# Patient Record
Sex: Female | Born: 1940
Health system: Southern US, Community
[De-identification: ages and names within clinical notes are randomized; demographics above are authoritative.]

## PROBLEM LIST (undated history)

## (undated) DIAGNOSIS — R569 Unspecified convulsions: Secondary | ICD-10-CM

## (undated) DIAGNOSIS — Z8679 Personal history of other diseases of the circulatory system: Secondary | ICD-10-CM

## (undated) DIAGNOSIS — F0390 Unspecified dementia without behavioral disturbance: Secondary | ICD-10-CM

## (undated) DIAGNOSIS — R4182 Altered mental status, unspecified: Secondary | ICD-10-CM

## (undated) DIAGNOSIS — R600 Localized edema: Secondary | ICD-10-CM

## (undated) DIAGNOSIS — M4800 Spinal stenosis, site unspecified: Secondary | ICD-10-CM

## (undated) DIAGNOSIS — Z9889 Other specified postprocedural states: Secondary | ICD-10-CM

## (undated) DIAGNOSIS — M810 Age-related osteoporosis without current pathological fracture: Secondary | ICD-10-CM

## (undated) HISTORY — DX: Localized edema: R60.0

## (undated) HISTORY — DX: Unspecified convulsions: R56.9

## (undated) HISTORY — PX: OTHER SURGICAL HISTORY: SHX169

## (undated) HISTORY — DX: Age-related osteoporosis without current pathological fracture: M81.0

## (undated) HISTORY — PX: TONSILLECTOMY: SUR1361

## (undated) HISTORY — DX: Spinal stenosis, site unspecified: M48.00

---

## 2003-08-03 ENCOUNTER — Emergency Department (HOSPITAL_COMMUNITY): Admission: EM | Admit: 2003-08-03 | Discharge: 2003-08-03 | Payer: Self-pay | Admitting: Emergency Medicine

## 2008-08-22 ENCOUNTER — Encounter: Admission: RE | Admit: 2008-08-22 | Discharge: 2008-08-22 | Payer: Self-pay | Admitting: Family Medicine

## 2009-06-09 ENCOUNTER — Encounter: Admission: RE | Admit: 2009-06-09 | Discharge: 2009-06-09 | Payer: Self-pay | Admitting: Family Medicine

## 2009-06-23 ENCOUNTER — Encounter: Admission: RE | Admit: 2009-06-23 | Discharge: 2009-06-23 | Payer: Self-pay | Admitting: Family Medicine

## 2010-06-15 ENCOUNTER — Encounter: Admission: RE | Admit: 2010-06-15 | Discharge: 2010-06-15 | Payer: Self-pay | Admitting: Family Medicine

## 2010-07-18 ENCOUNTER — Ambulatory Visit (HOSPITAL_COMMUNITY): Admission: RE | Admit: 2010-07-18 | Discharge: 2010-07-18 | Payer: Self-pay | Admitting: Neurosurgery

## 2010-08-17 ENCOUNTER — Ambulatory Visit (HOSPITAL_COMMUNITY): Admission: RE | Admit: 2010-08-17 | Discharge: 2010-08-17 | Payer: Self-pay | Admitting: Neurosurgery

## 2010-09-01 HISTORY — PX: CEREBRAL ANEURYSM REPAIR: SHX164

## 2010-09-02 ENCOUNTER — Inpatient Hospital Stay (HOSPITAL_COMMUNITY)
Admission: RE | Admit: 2010-09-02 | Discharge: 2010-09-05 | Payer: Self-pay | Source: Home / Self Care | Admitting: Neurosurgery

## 2010-12-12 LAB — BASIC METABOLIC PANEL
BUN: 10 mg/dL (ref 6–23)
BUN: 11 mg/dL (ref 6–23)
CO2: 28 mEq/L (ref 19–32)
CO2: 30 mEq/L (ref 19–32)
Calcium: 8.9 mg/dL (ref 8.4–10.5)
Calcium: 9.3 mg/dL (ref 8.4–10.5)
Chloride: 102 mEq/L (ref 96–112)
Chloride: 104 mEq/L (ref 96–112)
Creatinine, Ser: 0.62 mg/dL (ref 0.4–1.2)
Creatinine, Ser: 0.75 mg/dL (ref 0.4–1.2)
GFR calc Af Amer: >60 mL/min (ref >60)
GFR calc Af Amer: >60 mL/min (ref >60)
GFR calc non Af Amer: >60 mL/min (ref >60)
GFR calc non Af Amer: >60 mL/min (ref >60)
Glucose, Bld: 103 mg/dL — ABNORMAL HIGH (ref 70–99)
Glucose, Bld: 151 mg/dL — ABNORMAL HIGH (ref 70–99)
Potassium: 3.9 mEq/L (ref 3.5–5.1)
Potassium: 4.2 mEq/L (ref 3.5–5.1)
Sodium: 138 mEq/L (ref 135–145)
Sodium: 138 mEq/L (ref 135–145)

## 2010-12-12 LAB — URINALYSIS, ROUTINE W REFLEX MICROSCOPIC
Bilirubin Urine: NEGATIVE
Glucose, UA: NEGATIVE mg/dL
Hgb urine dipstick: NEGATIVE
Ketones, ur: 15 mg/dL — AB
Nitrite: NEGATIVE
Protein, ur: NEGATIVE mg/dL
Specific Gravity, Urine: 1.009 (ref 1.005–1.030)
Urobilinogen, UA: 0.2 mg/dL (ref 0.0–1.0)
pH: 7 (ref 5.0–8.0)

## 2010-12-13 LAB — URINE MICROSCOPIC-ADD ON

## 2010-12-13 LAB — CBC
HCT: 41.5 % (ref 36.0–46.0)
Hemoglobin: 13.4 g/dL (ref 12.0–15.0)
MCH: 32.3 pg (ref 26.0–34.0)
MCHC: 32.3 g/dL (ref 30.0–36.0)
MCV: 100 fL (ref 78.0–100.0)
Platelets: 250 10*3/uL (ref 150–400)
RBC: 4.15 MIL/uL (ref 3.87–5.11)
RDW: 13.4 % (ref 11.5–15.5)
WBC: 5.2 10*3/uL (ref 4.0–10.5)

## 2010-12-13 LAB — TYPE AND SCREEN
ABO/RH(D): O POS
Antibody Screen: NEGATIVE
Unit division: 0
Unit division: 0

## 2010-12-13 LAB — URINALYSIS, ROUTINE W REFLEX MICROSCOPIC
Bilirubin Urine: NEGATIVE
Glucose, UA: NEGATIVE mg/dL
Hgb urine dipstick: NEGATIVE
Ketones, ur: NEGATIVE mg/dL
Nitrite: NEGATIVE
Protein, ur: NEGATIVE mg/dL
Specific Gravity, Urine: 1.024 (ref 1.005–1.030)
Urobilinogen, UA: 0.2 mg/dL (ref 0.0–1.0)
pH: 5.5 (ref 5.0–8.0)

## 2010-12-13 LAB — ABO/RH: ABO/RH(D): O POS

## 2010-12-13 LAB — SURGICAL PCR SCREEN
MRSA, PCR: NEGATIVE
Staphylococcus aureus: NEGATIVE

## 2010-12-14 LAB — CBC
HCT: 37.2 % (ref 36.0–46.0)
Hemoglobin: 12.3 g/dL (ref 12.0–15.0)
MCH: 32.3 pg (ref 26.0–34.0)
MCHC: 33.1 g/dL (ref 30.0–36.0)
MCV: 97.6 fL (ref 78.0–100.0)
Platelets: 174 10*3/uL (ref 150–400)
RBC: 3.81 MIL/uL — ABNORMAL LOW (ref 3.87–5.11)
RDW: 13.5 % (ref 11.5–15.5)
WBC: 4.1 10*3/uL (ref 4.0–10.5)

## 2010-12-14 LAB — BASIC METABOLIC PANEL
BUN: 11 mg/dL (ref 6–23)
CO2: 30 mEq/L (ref 19–32)
Calcium: 9 mg/dL (ref 8.4–10.5)
Chloride: 105 mEq/L (ref 96–112)
Creatinine, Ser: 0.74 mg/dL (ref 0.4–1.2)
GFR calc Af Amer: >60 mL/min (ref >60)
GFR calc non Af Amer: >60 mL/min (ref >60)
Glucose, Bld: 98 mg/dL (ref 70–99)
Potassium: 3.9 mEq/L (ref 3.5–5.1)
Sodium: 139 mEq/L (ref 135–145)

## 2010-12-14 LAB — PROTIME-INR
INR: 0.89 (ref 0.00–1.49)
Prothrombin Time: 12.2 seconds (ref 11.6–15.2)

## 2010-12-14 LAB — APTT: aPTT: 28 seconds (ref 24–37)

## 2010-12-31 ENCOUNTER — Emergency Department (HOSPITAL_COMMUNITY): Payer: Medicare Other

## 2010-12-31 ENCOUNTER — Inpatient Hospital Stay (HOSPITAL_COMMUNITY): Payer: Medicare Other

## 2010-12-31 ENCOUNTER — Inpatient Hospital Stay (HOSPITAL_COMMUNITY)
Admission: EM | Admit: 2010-12-31 | Discharge: 2011-01-02 | DRG: 690 | Disposition: A | Discharge status: Home Health | Payer: Medicare Other | Attending: Internal Medicine | Admitting: Internal Medicine

## 2010-12-31 DIAGNOSIS — R404 Transient alteration of awareness: Secondary | ICD-10-CM | POA: Diagnosis present

## 2010-12-31 DIAGNOSIS — F068 Other specified mental disorders due to known physiological condition: Secondary | ICD-10-CM | POA: Diagnosis present

## 2010-12-31 DIAGNOSIS — N390 Urinary tract infection, site not specified: Principal | ICD-10-CM | POA: Diagnosis present

## 2010-12-31 DIAGNOSIS — R5381 Other malaise: Secondary | ICD-10-CM | POA: Diagnosis present

## 2010-12-31 LAB — RAPID URINE DRUG SCREEN, HOSP PERFORMED
Amphetamines: NOT DETECTED
Barbiturates: NOT DETECTED
Benzodiazepines: NOT DETECTED
Opiates: NOT DETECTED
Tetrahydrocannabinol: NOT DETECTED

## 2010-12-31 LAB — URINALYSIS, ROUTINE W REFLEX MICROSCOPIC
Bilirubin Urine: NEGATIVE
Glucose, UA: NEGATIVE mg/dL
Ketones, ur: NEGATIVE mg/dL
Nitrite: NEGATIVE
Specific Gravity, Urine: 1.037 — ABNORMAL HIGH (ref 1.005–1.030)
pH: 5 (ref 5.0–8.0)

## 2010-12-31 LAB — DIFFERENTIAL
Basophils Absolute: 0 10*3/uL (ref 0.0–0.1)
Basophils Relative: 0 % (ref 0–1)
Eosinophils Absolute: 0 10*3/uL (ref 0.0–0.7)
Eosinophils Relative: 0 % (ref 0–5)
Monocytes Absolute: 0.7 10*3/uL (ref 0.1–1.0)

## 2010-12-31 LAB — CBC
MCHC: 33.3 g/dL (ref 30.0–36.0)
Platelets: 184 10*3/uL (ref 150–400)
RDW: 13.8 % (ref 11.5–15.5)
WBC: 15.5 10*3/uL — ABNORMAL HIGH (ref 4.0–10.5)

## 2010-12-31 LAB — ACETAMINOPHEN LEVEL: Acetaminophen (Tylenol), Serum: <10 ug/mL — ABNORMAL LOW (ref 10–30)

## 2010-12-31 LAB — POCT I-STAT, CHEM 8
BUN: 24 mg/dL — ABNORMAL HIGH (ref 6–23)
Calcium, Ion: 1.11 mmol/L — ABNORMAL LOW (ref 1.12–1.32)
Chloride: 109 mEq/L (ref 96–112)
Glucose, Bld: 213 mg/dL — ABNORMAL HIGH (ref 70–99)
HCT: 46 % (ref 36.0–46.0)
Potassium: 4.4 mEq/L (ref 3.5–5.1)

## 2010-12-31 LAB — PROTIME-INR: Prothrombin Time: 12.7 seconds (ref 11.6–15.2)

## 2010-12-31 LAB — URINE MICROSCOPIC-ADD ON

## 2010-12-31 LAB — SALICYLATE LEVEL: Salicylate Lvl: <4 mg/dL (ref 2.8–20.0)

## 2010-12-31 LAB — ETHANOL: Alcohol, Ethyl (B): <5 mg/dL (ref 0–10)

## 2011-01-01 LAB — CBC
HCT: 36.6 % (ref 36.0–46.0)
MCHC: 32.5 g/dL (ref 30.0–36.0)
Platelets: 171 10*3/uL (ref 150–400)
RDW: 13.8 % (ref 11.5–15.5)
WBC: 8.1 10*3/uL (ref 4.0–10.5)

## 2011-01-01 LAB — URINE CULTURE: Culture  Setup Time: 201203311800

## 2011-01-01 LAB — BASIC METABOLIC PANEL
Calcium: 8.4 mg/dL (ref 8.4–10.5)
GFR calc non Af Amer: >60 mL/min (ref >60)
Glucose, Bld: 121 mg/dL — ABNORMAL HIGH (ref 70–99)
Sodium: 138 mEq/L (ref 135–145)

## 2011-01-02 LAB — CBC
HCT: 37.2 % (ref 36.0–46.0)
MCH: 31.1 pg (ref 26.0–34.0)
MCV: 96.4 fL (ref 78.0–100.0)
RBC: 3.86 MIL/uL — ABNORMAL LOW (ref 3.87–5.11)
RDW: 13.9 % (ref 11.5–15.5)
WBC: 6.2 10*3/uL (ref 4.0–10.5)

## 2011-01-02 LAB — BASIC METABOLIC PANEL
BUN: 9 mg/dL (ref 6–23)
Chloride: 106 mEq/L (ref 96–112)
Glucose, Bld: 87 mg/dL (ref 70–99)
Potassium: 3.5 mEq/L (ref 3.5–5.1)

## 2011-01-04 NOTE — Discharge Summary (Signed)
Jaime Barry, Jaime Barry                 ACCOUNT NO.:  [id number]  MEDICAL RECORD NO.:  [id number]           PATIENT TYPE:  I  LOCATION:  3731                         FACILITY:  [facility]  PHYSICIAN:  Thad Ranger, MD       DATE OF BIRTH:  [birth year]  DATE OF ADMISSION:  12/31/2010 DATE OF DISCHARGE:                        DISCHARGE SUMMARY - REFERRING   PRIMARY CARE PHYSICIAN:  Pam Drown, M.D.  DISCHARGE DIAGNOSES: 1. Acute altered mental status/delirium superimposed on dementia,     resolved. 2. Urinary tract infection. 3. Generalized debility with deconditioning. 4. Recent carotid ophthalmic artery aneurysm, status post clipping in     December 2011.  DISCHARGE MEDICATIONS: 1. Keflex 250 mg p.o. b.i.d. for 3 days. 2. Aricept 10 mg p.o. q.p.m. 3. Multivitamin 1 tablet p.o. daily.  BRIEF HISTORY OF PRESENT ILLNESS:  At the time of admission, Jaime Barry is a 70 year old female with history of dementia, who presented to the [facility] Emergency Room with altered mental status.  At the time, the patient was seen by myself in the emergency room, she had received Geodon 20 mg with 2 mg of Ativan for combativeness, and she was completely lethargic.  However, per the H and P obtained from the patient's husband, they usually wake up at 5:00 a.m. in the morning and this morning, he noticed that she was confused and had vomited as well as was feeling for the things in the room, looking very confused.  For details, please refer to the admission note dictated by myself on December 31, 2010.  RADIOLOGICAL DATA:  CT head without contrast on March 31 showed interval clipping of the right para-ophthalmic aneurysm without any demonstrated complications.  No acute intracranial findings.  Chest x-ray on March 31, no active disease, poor inspiration.  Repeated again showed no acute chest process.  LABORATORY DIAGNOSTIC DATA:  At the time of admission; sodium 144, potassium 4.4, BUN 24,  creatinine 0.9.  UA was positive for UTI.  Urine drug screen negative.  CBC had shown white count of 15.5 with hemoglobin of 14.8, hematocrit 44.5, platelets 184 with left shift, 90% neutrophils.  This has improved at the time of discharge to white count of 6.2, hemoglobin at 12.0, hematocrit 37.2, platelets 162.  BRIEF HOSPITALIZATION COURSE:  Jaime Barry is a 70 year old female who has a history of dementia, who was brought with acute delirium superimposed on dementia. 1. Delirium superimposed on dementia.  The patient, at the time of     admission, was in restraints and had received significant amount of     Geodon and Ativan for combativeness.  The patient was placed on the     monitor floor and with one-to-one sitter for safety.  She is     currently at her baseline and has not required any Ativan or Geodon     since the time of admission.  She was found to have UTI and was     treated with Rocephin.  The patient will be discharged on 3 days of     Keflex. 2. Generalized deconditioning with debility.  Physical Therapy  evaluation was done during the hospitalization and recommended 24x7     supervision as the patient is at high risk of fall, secondary to     poor balance, cognition, and facial awareness, etc.  Physical     therapy as well as myself talked to the patient's husband in     detail.  Mr. Sanvika Cuttino, for this point, declined skilled nursing     facility placement.  He also states that it was difficult for him     to establish 24x7 supervision.  However, he will arrange for the     physical therapy outpatient.  Risks of continued worsening of     cognition and risk of falls were explained to the patient's husband     in detail.  He, at this point, requested to have a followup with     the patient's primary care physician, Dr. Gweneth Dimitri within next     1-2 weeks and discussed with the PCP for any further decisions.  He     also explained to me that the patient has a  habit of holding, which     also limits her having 24x7 caregiver in the house.  I also     discussed with him regarding a refer to a psychiatrist or a     psychological evaluation of the patient through her primary care     physician.  The patient's husband completely understands all of the     above.  The patient is currently medically ready to be discharged     as I dictated above.  The patient's husband and the patient refuses     skilled nursing facility at this time.  At the time of discharge, VITAL SIGNS:  Temperature 97.4, pulse 91, respirations 18, blood pressure 115/72, O2 sats 99% on room air. GENERAL:  The patient is alert and awake, not in any acute distress. HEENT:  Anicteric sclerae and conjunctivae.  Pupils are reactive to light and accommodation.  EOMI. NECK:  Supple.  No lymphadenopathy. CVS:  S1, S2. CHEST:  Clear to auscultation bilaterally. ABDOMEN:  Soft, nontender, nondistended.  Normal bowel sounds. EXTREMITIES:  No cyanosis, clubbing, or edema noted bilaterally.  DISCHARGE FOLLOWUP:  With Dr. Gweneth Dimitri within next 2 weeks.  DISCHARGE TIME:  35 minutes.     Thad Ranger, MD     RR/MEDQ  D:  01/02/2011  T:  01/02/2011  Job:  [id number]  cc:   Pam Drown, M.D.  Electronically Signed by Andres Labrum RAI  on 01/04/2011 04:46:15 PM

## 2011-01-04 NOTE — H&P (Signed)
NAME:  Jaime Barry, Jaime Barry                 ACCOUNT NO.:  [id number]  MEDICAL RECORD NO.:  [id number]           Barry TYPE:  E  LOCATION:  [facility]                         FACILITY:  [facility]  PHYSICIAN:  Thad Ranger, MD       DATE OF BIRTH:  [birth year]  DATE OF ADMISSION:  12/31/2010 DATE OF DISCHARGE:                             HISTORY & PHYSICAL   PRIMARY CARE PHYSICIAN:  Pam Drown, MD  NEUROSURGEON:  Coletta Memos, MD  CHIEF COMPLAINT:  Altered mental status.  HISTORY OF PRESENT ILLNESS:  Jaime Barry is a 70 year old female with history of dementia, history of right carotid ophthalmic artery aneurysm status post clipping in December 2011, was brought to [facility] emergency room with altered mental status.  At Jaime time of my encounter, Jaime Barry had received Geodon 20 mg with Ativan 2 mg for combativeness and is now resting comfortably.  History was obtained from Jaime Barry's husband, present in Jaime room. Mr. Elyse Prevo.  Per Jaime Barry's husband, they usually wake up at 5:00 a.m. in Jaime morning and this morning, he noticed that Jaime Barry was confused and had vomited as well as and was feeling for Jaime things confused in Jaime room.  Jaime Barry's husband states that up until yesterday, Jaime Barry was in Jaime Barry baseline status as Jaime Barry has a history of dementia and has been on Aricept for Jaime last few 8 years, and Jaime Barry forgetfulness and memory issues has been progressively worsening.  This morning, Jaime Barry was very weak and was unable to stand up; however, there was no syncopal episode.  Jaime Barry's husband called Jaime EMS and subsequently, Jaime Barry became very combative and agitated.  In Jaime emergency room, received Geodon and Ativan.  Otherwise, he reported no fevers or chills, any nausea, vomiting, abdominal pain, diarrhea or constipation, any productive cough.  REVIEW OF SYSTEMS:  I was unable to obtain from Jaime Barry due to Jaime Barry mental status, however, see Jaime pertinent history  dictated above.  PAST MEDICAL HISTORY: 1. History of dementia. 2. Right carotid ophthalmic artery aneurysm status post clipping in     December 2011.  SURGICAL HISTORY:  Cerebral aneurysm clipping in December 2011.  SOCIAL HISTORY:  Jaime Barry is a nonsmoker.  Does not drink alcohol or use any drugs.  Jaime Barry currently lives at home with Jaime Barry husband.  Per Mr. Newby, Jaime Barry husband, Jaime Barry is otherwise functional with Jaime Barry ADLs and even drives Jaime Barry car.  MEDICATIONS PRIOR TO ADMISSION:  Aricept 10 mg q.p.m. daily for Jaime last 2-3 years.  PHYSICAL EXAMINATION:  VITAL SIGNS:  Blood pressure 98/70, pulse 95, respirations 18, temperature 98.0. GENERAL:  Jaime Barry is currently resting comfortably, however, still in restraint, unable to give me any history.  However, spontaneously moves Jaime Barry extremities. CVS:  S1 and S2 clear. CHEST:  Fairly clear to auscultation anteriorly bilaterally. ABDOMEN:  Soft, nontender, nondistended.  Normal bowel sounds. EXTREMITIES:  No cyanosis, clubbing or edema noted in upper and lower extremities, in restraint. NEUROLOGIC:  Moving 4 extremities, but however still in restraint, unable to give me Jaime neuro examination.  LAB  DIAGNOSTIC DATA:  Sodium 144, potassium 4.4, BUN 24, creatinine 0.9. UA positive for UTI with 7-10 wbc's, many bacteria.  INR 0.93.  Urine drug screen negative.  CBC showed white count of 15.5, hemoglobin 14.8, hematocrit 44.5, platelets 184.  Acetaminophen level less than 10, alcohol level less than 5, aspirin level less than 4.  RADIOLOGICAL DATA:  Chest x-ray still pending at Jaime time of my dictation.  CT head without contrast, interval clipping of Jaime right parophthalmic aneurysm without demonstrated complications.  No acute intracranial findings.  IMPRESSION AND PLAN:  Jaime Barry is a 70 year old female with history of dementia, presents with altered mental status and urinary tract infection. 1. Altered mental status, superimposed on  dementia.  Jaime Barry is     currently in restraints and has received a significant amount of     Geodon and Ativan.  I will place Jaime Barry on a monitored floor and also     place with one-to-one sitter for safety, may still continue other     restraints until Jaime Barry is alert and awake and closer to Jaime Barry     baseline.  I will place Jaime Barry on only p.r.n. Ativan for     combativeness, avoid Haldol due to arrhythmia affect. 2. Urinary tract infection.  Urine culture has been pending.  We will     obtain blood cultures and continue Rocephin 1 g IV daily.     Antibiotics will be adjusted according to Jaime culture and     sensitivity. 3. Recent aneurysm clipping.  Jaime Barry currently is not giving any     neuro examination and per details encounter with Jaime Barry's     husband, does not appear that Jaime Barry had any syncopal episode;     however, appears that Jaime Barry has altered mental status     secondary to Jaime UTI.  My suspicion for stroke is low, although     cannot be ruled out.  We will repeat another CT head if Barry's     mental status is not improving or has any neuro deficits. 4. Generalized deconditioning.  We will obtain PT/OT evaluation and     stop Jaime diet once Jaime Barry is more alert and awake.  CODE STATUS:  I discussed in detail with Jaime Barry's husband, Mr. Jaime Barry and requested DNR status, but okay with pressors if needed. His contact number is cell phone, [phone], office is [phone].     Thad Ranger, MD     RR/MEDQ  D:  12/31/2010  T:  12/31/2010  Job:  [id number]  cc:   Pam Drown, M.D. Coletta Memos, M.D.  Electronically Signed by Andres Labrum Kohen Reither  on 01/04/2011 04:46:07 PM

## 2011-01-06 LAB — CULTURE, BLOOD (ROUTINE X 2)
Culture  Setup Time: 201203311804
Culture  Setup Time: 201203311755
Culture: NO GROWTH

## 2011-01-09 ENCOUNTER — Ambulatory Visit: Payer: Medicare Other | Attending: Internal Medicine | Admitting: Physical Therapy

## 2011-01-09 ENCOUNTER — Ambulatory Visit: Payer: Medicare Other | Admitting: Occupational Therapy

## 2011-01-09 DIAGNOSIS — IMO0001 Reserved for inherently not codable concepts without codable children: Secondary | ICD-10-CM | POA: Insufficient documentation

## 2011-01-09 DIAGNOSIS — R269 Unspecified abnormalities of gait and mobility: Secondary | ICD-10-CM | POA: Insufficient documentation

## 2011-01-09 DIAGNOSIS — M6281 Muscle weakness (generalized): Secondary | ICD-10-CM | POA: Insufficient documentation

## 2012-08-29 ENCOUNTER — Observation Stay (HOSPITAL_COMMUNITY): Payer: Medicare Other

## 2012-08-29 ENCOUNTER — Emergency Department (HOSPITAL_COMMUNITY): Payer: Medicare Other

## 2012-08-29 ENCOUNTER — Encounter (HOSPITAL_COMMUNITY): Payer: Self-pay | Admitting: Internal Medicine

## 2012-08-29 ENCOUNTER — Inpatient Hospital Stay (HOSPITAL_COMMUNITY)
Admission: EM | Admit: 2012-08-29 | Discharge: 2012-09-01 | DRG: 101 | Disposition: A | Discharge status: Home / Self Care | Payer: Medicare Other | Attending: Internal Medicine | Admitting: Internal Medicine

## 2012-08-29 DIAGNOSIS — Z9889 Other specified postprocedural states: Secondary | ICD-10-CM

## 2012-08-29 DIAGNOSIS — I959 Hypotension, unspecified: Secondary | ICD-10-CM | POA: Diagnosis present

## 2012-08-29 DIAGNOSIS — Z8679 Personal history of other diseases of the circulatory system: Secondary | ICD-10-CM

## 2012-08-29 DIAGNOSIS — E876 Hypokalemia: Secondary | ICD-10-CM | POA: Diagnosis present

## 2012-08-29 DIAGNOSIS — F0390 Unspecified dementia without behavioral disturbance: Secondary | ICD-10-CM | POA: Diagnosis present

## 2012-08-29 DIAGNOSIS — R4182 Altered mental status, unspecified: Secondary | ICD-10-CM

## 2012-08-29 DIAGNOSIS — R569 Unspecified convulsions: Principal | ICD-10-CM

## 2012-08-29 HISTORY — PX: LUMBAR PUNCTURE: SHX1985

## 2012-08-29 HISTORY — DX: Unspecified convulsions: R56.9

## 2012-08-29 HISTORY — DX: Other specified postprocedural states: Z98.890

## 2012-08-29 HISTORY — DX: Altered mental status, unspecified: R41.82

## 2012-08-29 HISTORY — DX: Unspecified dementia, unspecified severity, without behavioral disturbance, psychotic disturbance, mood disturbance, and anxiety: F03.90

## 2012-08-29 HISTORY — DX: Personal history of other diseases of the circulatory system: Z86.79

## 2012-08-29 LAB — CBC WITH DIFFERENTIAL/PLATELET
Basophils Absolute: 0 10*3/uL (ref 0.0–0.1)
Basophils Relative: 0 % (ref 0–1)
Eosinophils Absolute: 0 10*3/uL (ref 0.0–0.7)
Eosinophils Relative: 0 % (ref 0–5)
HCT: 39 % (ref 36.0–46.0)
MCH: 31.8 pg (ref 26.0–34.0)
MCHC: 32.6 g/dL (ref 30.0–36.0)
MCV: 97.5 fL (ref 78.0–100.0)
Monocytes Absolute: 0.3 10*3/uL (ref 0.1–1.0)
RDW: 13.3 % (ref 11.5–15.5)

## 2012-08-29 LAB — HEMOGLOBIN A1C: Hgb A1c MFr Bld: 5.9 % — ABNORMAL HIGH (ref <5.7)

## 2012-08-29 LAB — URINALYSIS, ROUTINE W REFLEX MICROSCOPIC
Glucose, UA: NEGATIVE mg/dL
Hgb urine dipstick: NEGATIVE
Protein, ur: NEGATIVE mg/dL
Specific Gravity, Urine: 1.021 (ref 1.005–1.030)
pH: 5.5 (ref 5.0–8.0)

## 2012-08-29 LAB — COMPREHENSIVE METABOLIC PANEL
AST: 18 U/L (ref 0–37)
Albumin: 3.7 g/dL (ref 3.5–5.2)
BUN: 15 mg/dL (ref 6–23)
Calcium: 9 mg/dL (ref 8.4–10.5)
Chloride: 104 mEq/L (ref 96–112)
Creatinine, Ser: 0.74 mg/dL (ref 0.50–1.10)
Total Protein: 6.4 g/dL (ref 6.0–8.3)

## 2012-08-29 LAB — POCT I-STAT, CHEM 8
BUN: 14 mg/dL (ref 6–23)
Creatinine, Ser: 0.9 mg/dL (ref 0.50–1.10)
Potassium: 3.5 mEq/L (ref 3.5–5.1)
Sodium: 139 mEq/L (ref 135–145)
TCO2: 22 mmol/L (ref 0–100)

## 2012-08-29 LAB — URINE MICROSCOPIC-ADD ON

## 2012-08-29 MED ORDER — SODIUM CHLORIDE 0.9 % IV SOLN
950.0000 mg | Freq: Once | INTRAVENOUS | Status: AC
  Administered 2012-08-29: 950 mg via INTRAVENOUS
  Filled 2012-08-29: qty 19

## 2012-08-29 MED ORDER — MORPHINE SULFATE 2 MG/ML IJ SOLN
1.0000 mg | INTRAMUSCULAR | Status: DC | PRN
  Administered 2012-08-29: 2 mg via INTRAVENOUS
  Filled 2012-08-29: qty 1

## 2012-08-29 MED ORDER — ACETAMINOPHEN 325 MG PO TABS: 650.0000 mg | ORAL_TABLET | Freq: Four times a day (QID) | ORAL | Status: DC | PRN

## 2012-08-29 MED ORDER — ACETAMINOPHEN 650 MG RE SUPP: 650.0000 mg | Freq: Four times a day (QID) | RECTAL | Status: DC | PRN

## 2012-08-29 MED ORDER — ONDANSETRON HCL 4 MG PO TABS: 4.0000 mg | ORAL_TABLET | Freq: Four times a day (QID) | ORAL | Status: DC | PRN

## 2012-08-29 MED ORDER — ACYCLOVIR SODIUM 50 MG/ML IV SOLN
550.0000 mg | Freq: Three times a day (TID) | INTRAVENOUS | Status: DC
  Administered 2012-08-29 – 2012-09-01 (×8): 550 mg via INTRAVENOUS
  Filled 2012-08-29 (×9): qty 11

## 2012-08-29 MED ORDER — MEMANTINE HCL 5 MG PO TABS
5.0000 mg | ORAL_TABLET | Freq: Two times a day (BID) | ORAL | Status: DC
Start: 2012-08-29 — End: 2012-09-01
  Administered 2012-08-29 – 2012-09-01 (×6): 5 mg via ORAL
  Filled 2012-08-29 (×8): qty 1

## 2012-08-29 MED ORDER — LORAZEPAM 2 MG/ML IJ SOLN
1.0000 mg | INTRAMUSCULAR | Status: DC | PRN
  Administered 2012-08-29: 2 mg via INTRAVENOUS
  Filled 2012-08-29: qty 1

## 2012-08-29 MED ORDER — SODIUM CHLORIDE 0.9 % IV BOLUS (SEPSIS)
500.0000 mL | Freq: Once | INTRAVENOUS | Status: AC
  Administered 2012-08-29: 500 mL via INTRAVENOUS

## 2012-08-29 MED ORDER — LORAZEPAM 2 MG/ML IJ SOLN
0.5000 mg | Freq: Once | INTRAMUSCULAR | Status: AC
  Administered 2012-08-29: 0.5 mg via INTRAVENOUS
  Filled 2012-08-29: qty 1

## 2012-08-29 MED ORDER — SODIUM CHLORIDE 0.9 % IV SOLN: INTRAVENOUS | Status: AC

## 2012-08-29 MED ORDER — SODIUM CHLORIDE 0.9 % IJ SOLN
3.0000 mL | Freq: Two times a day (BID) | INTRAMUSCULAR | Status: DC
  Administered 2012-09-01: 3 mL via INTRAVENOUS

## 2012-08-29 MED ORDER — ONDANSETRON HCL 4 MG/2ML IJ SOLN: 4.0000 mg | Freq: Four times a day (QID) | INTRAMUSCULAR | Status: DC | PRN

## 2012-08-29 MED ORDER — OXYCODONE HCL 5 MG PO TABS: 5.0000 mg | ORAL_TABLET | ORAL | Status: DC | PRN

## 2012-08-29 MED ORDER — SODIUM CHLORIDE 0.9 % IV SOLN
INTRAVENOUS | Status: DC
  Administered 2012-08-29 – 2012-08-31 (×3): via INTRAVENOUS
  Administered 2012-08-31: 1000 mL via INTRAVENOUS
  Administered 2012-09-01: 04:00:00 via INTRAVENOUS

## 2012-08-29 MED ORDER — PHENYTOIN SODIUM 50 MG/ML IJ SOLN
100.0000 mg | Freq: Three times a day (TID) | INTRAMUSCULAR | Status: DC
  Administered 2012-08-29 – 2012-08-31 (×6): 100 mg via INTRAVENOUS
  Filled 2012-08-29 (×8): qty 2

## 2012-08-29 MED ORDER — DONEPEZIL HCL 10 MG PO TABS
10.0000 mg | ORAL_TABLET | Freq: Every day | ORAL | Status: DC
  Administered 2012-08-29 – 2012-08-31 (×3): 10 mg via ORAL
  Filled 2012-08-29 (×4): qty 1

## 2012-08-29 MED ORDER — HEPARIN SODIUM (PORCINE) 5000 UNIT/ML IJ SOLN
5000.0000 [IU] | Freq: Three times a day (TID) | INTRAMUSCULAR | Status: DC
  Administered 2012-08-29: 5000 [IU] via SUBCUTANEOUS
  Filled 2012-08-29 (×4): qty 1

## 2012-08-29 NOTE — Procedures (Signed)
History:71 yo F with new onset seizure and post-ictal aphasia and confusion.   Sedation: None  Background: There is a well defined posterior dominant rhythm of 9 Hz that attenuates with eye opening. There is a reduction in faster frequencies in the left temporal leads compared to the right. There is generalized irregular slow activity. The posterior dominant rhythm is also seen better on the right than left. There are intermittent left frontotemporal sharp waves(F7 > T7 > F3 > Fp1) as well as right frontotemporal sharp waves(F8 > F4, T8 > Fp2) with at least one periodic run.   Photic stimulation: Physiologic driving is present  EEG Diagnosis: 1) Occasional right frontotemporal sharp waves with a single 2 second run of periodic discharges.  2) Independent left frontotemporal sharp waves 3) Attenuation of faster frequencies in the left posterior quadrant 4) Generalized irregular slow activity.   Clinical Interpretation: This EEG is consistent with bilateral independent zones of potential epileptogenicity in the frontotemporal regions. In addition, there is evidence of a left focal posterior quadrant cortical dysfunction as well as a mild generalized non-specific cerebral dysfunction(encephalopathy) There was no seizure recorded on this study.   Ritta Slot, MD [facility] [phone]  If 7pm- 7am, please page neurology on call at [phone].

## 2012-08-29 NOTE — Progress Notes (Signed)
EEG completed.

## 2012-08-29 NOTE — Consult Note (Signed)
Reason for Consult: Seizure Referring Physician: Clydia Llano  CC: Seizure  History is obtained from: Medical record  HPI: Jaime Barry is a 71 y.o. female with a history of dementia as well as an unruptured opthalmic segment of the ICA cerebral aneurysm repaired in 2011. She was heard to have abonormal breathing pattern by her husband this morning and he called 911. She was seen to have seizure activity on arrival of EMS and given versed. She was transported to our ER where she was given ativan(1mg ) for agitation. She has since become more awake, but is not speaking.   She has not been ill, had fevers or headache. She seems to be improving from her arrival per her husband.   LSN: 8pm tpa given: no outside of window.   ROS: unobtainable due to aphasia.   Past Medical History  Diagnosis Date  . Dementia   . H/O cerebral aneurysm repair     in December of 2011    Family History per chart Mother -alzheimers Leukemia father  Social History: Tob: negative  Exam: Current vital signs: BP 106/47  Pulse 83  Temp 97.7 F (36.5 C) (Axillary)  Resp 20  Ht 5\' 4"  (1.626 m)  Wt 63.504 kg (140 lb)  BMI 24.03 kg/m2  SpO2 97% Vital signs in last 24 hours: Temp:  [97.7 F (36.5 C)-98.7 F (37.1 C)] 97.7 F (36.5 C) (11/28 0906) Pulse Rate:  [83-97] 83  (11/28 0906) Resp:  [16-24] 20  (11/28 0906) BP: (96-110)/(47-61) 106/47 mmHg (11/28 0906) SpO2:  [93 %-99 %] 97 % (11/28 0906) Weight:  [63.504 kg (140 lb)] 63.504 kg (140 lb) (11/28 0537)  General: In bed, sitter at bedside CV: RRR Mental Status: Patient is awake, alert, she follows commands to show two fingers and her thumbs, as well as sticking out her tongue and closing her eyes. She appears to have a dense expressive aphasia Cranial Nerves: II: Visual Fields are full. Pupils are equal, round, and reactive to light.  Discs are difficult to visualize. III,IV, VI: EOMI without ptosis or diploplia.  V: Facial sensation is  untestable 2/2 aphasia VII: Facial movement is symmetric.  VIII: hearing is intact to voice X, XI, ION:[id number] 2/2 aphasia Motor: Tone is normal. Bulk is normal. 5/5 strength was present in all four extremities. She was briskly purposeful with them, and would follow commands.  Sensory: Responds to stimulus in all 4 ext Deep Tendon Reflexes: 2+ and symmetric in the biceps and patellae.  Cerebellar/Gait: Not testable due to aphasia.   I have reviewed labs in epic and the results pertinent to this consultation are: BMP - elevated glucose CBC unremarkable.   I have reviewed the images obtained:CT head - negative acute  Impression: 71 yo F with new onset seizure and persistent aphasia. I am concerned that she may have had a stroke with seizure at onset. A todd's phenomenon is also possible, and I would hope this to continue to improve over the course of the day. At this time, I would recommend starting treatment for seizures, as she is at high risk of recurrence in the setting of dementia. Will also need an MRI to rule out stroke. She has no signs of infection.   Recommendations: 1) Dilantin, load with 15 PE/kg fosphenytoin, then 100mg  TID phenytoin thereafter.  2) Dilantin level in the AM.  3) EEG, MRI   Ritta Slot, MD [facility] [phone]  If 7pm- 7am, please page neurology on call at [phone].

## 2012-08-29 NOTE — ED Provider Notes (Signed)
History     CSN: [id number]  Arrival date & time 08/29/12  0530   First MD Initiated Contact with Patient 08/29/12 0531      Chief Complaint  Patient presents with  . Seizures    (Consider location/radiation/quality/duration/timing/severity/associated sxs/prior treatment) Patient is a 71 y.o. female presenting with seizures. The history is provided by the patient, the EMS personnel and the spouse.  Seizures  This is a new problem.   EMS was called the patient's house for difficulty breathing. Last seen normal at around 8 PM last night. EMS arrived and found the patient to be seizing. Reportedly had right-sided tonic-clonic movements and clonic contraction of the left side. No previous seizure history. Patient was given 2.5 mg of Versed and the seizure activity stopped. She continued to have decreased mental status however. No fevers. Patient has a previous history of a paraophthalmic artery aneurysm surgery. She is reportedly been doing well the last few days.  No past medical history on file.  No past surgical history on file.  No family history on file.  History  Substance Use Topics  . Smoking status: Not on file  . Smokeless tobacco: Not on file  . Alcohol Use: Not on file    OB History    No data available      Review of Systems  Unable to perform ROS Neurological: Positive for seizures.    Allergies  Review of patient's allergies indicates no known allergies.  Home Medications   Current Outpatient Rx  Name  Route  Sig  Dispense  Refill  . DONEPEZIL HCL 10 MG PO TABS   Oral   Take 10 mg by mouth at bedtime.         Marland Kitchen MEMANTINE HCL 5 MG PO TABS   Oral   Take 5 mg by mouth 2 (two) times daily.           BP 96/61  Pulse 87  Temp 98.7 F (37.1 C) (Oral)  Resp 18  Ht 5\' 4"  (1.626 m)  Wt 140 lb (63.504 kg)  BMI 24.03 kg/m2  SpO2 93%  Physical Exam  Constitutional: She appears well-developed.  HENT:  Head: Normocephalic and atraumatic.    Eyes:       Patient's pupils are responsive. She has slow movements of her eyes back and forth repetitively.  Cardiovascular: Normal rate.   Pulmonary/Chest: Effort normal.  Abdominal: Soft. There is no tenderness.  Musculoskeletal: She exhibits no edema.  Neurological:       Initial minimal response to pain. Mental status had improved a little bit during the exam where she would reach to painful stimuli with her left side. Her right side was flaccid.  Skin: Skin is warm and dry.    ED Course  Procedures (including critical care time)  Labs Reviewed  CBC WITH DIFFERENTIAL - Abnormal; Notable for the following:    Neutrophils Relative 81 (*)     All other components within normal limits  COMPREHENSIVE METABOLIC PANEL - Abnormal; Notable for the following:    Glucose, Bld 197 (*)     GFR calc non Af Amer 85 (*)     All other components within normal limits  POCT I-STAT, CHEM 8 - Abnormal; Notable for the following:    Glucose, Bld 192 (*)     All other components within normal limits  URINALYSIS, ROUTINE W REFLEX MICROSCOPIC  URINALYSIS, MICROSCOPIC ONLY   Dg Chest 1 View  08/29/2012  *RADIOLOGY REPORT*  Clinical Data: Altered mental status; cough.  CHEST - 1 VIEW  Comparison: Chest radiograph performed 12/31/2010  Findings: The lungs are well-aerated and clear.  There is no evidence of focal opacification, pleural effusion or pneumothorax.  The cardiomediastinal silhouette is within normal limits.  No acute osseous abnormalities are seen.  IMPRESSION: No acute cardiopulmonary process seen.   Original Report Authenticated By: Tonia Ghent, M.D.    Ct Head Wo Contrast  08/29/2012  *RADIOLOGY REPORT*  Clinical Data: Altered mental status; difficulty breathing.  Right upper extremity weakness.  Aphasia.  Seizure-like activity.  CT HEAD WITHOUT CONTRAST  Technique:  Contiguous axial images were obtained from the base of the skull through the vertex without contrast.  Comparison: CT of  the head performed 12/31/2010, and MRI/MRA of the brain performed 06/15/2010  Findings: There is no evidence of acute infarction, mass lesion, or intra- or extra-axial hemorrhage on CT.  Periventricular and subcortical white matter change likely reflects small vessel ischemic microangiopathy.  Aneurysm clips anterior to the circle of Willis are grossly unchanged in appearance.  The posterior fossa, including the cerebellum, brainstem and fourth ventricle, is within normal limits.  The third and lateral ventricles, and basal ganglia are unremarkable in appearance.  The cerebral hemispheres are symmetric in appearance, with normal gray- white differentiation.  No mass effect or midline shift is seen.  There is no evidence of fracture; postoperative change along the right frontotemporal calvarium is grossly stable.  The visualized portions of the orbits are within normal limits.  The paranasal sinuses and mastoid air cells are well-aerated.  No significant soft tissue abnormalities are seen.  IMPRESSION:  1.  No acute intracranial pathology seen on CT. 2.  Mild small vessel ischemic microangiopathy. 3.  Postoperative changes again noted.   Original Report Authenticated By: Tonia Ghent, M.D.      1. Seizure   2. Altered mental status       MDM  Patient with new onset seizure and altered mental status. Lab work is overall reassuring. Urinalysis is pending. Blood pressure has improved on the monitor. Head CT is reassuring. Patient be admitted to medicine. She has become agitated and required IV Ativan.        Juliet Rude. Rubin Payor, MD 08/29/12 1610

## 2012-08-29 NOTE — H&P (Signed)
[facility] History and Physical  Jaime Barry ZOX:[id number] DOB: [birth year] DOA: 08/29/2012  Referring physician: Juliet Rude. Rubin Payor, MD PCP: No primary provider on file.   Chief Complaint: Seizure  HPI: Jaime Barry is a 71 y.o. female was past medical history of dementia, history of cerebral aneurysm clipping in December of 2011 to the hospital because of seizure. Patient was confused not sure it was postictal or because of benzodiazepine she was given in the emergency department. The history was obtained from her husband, he said when she went to bed last night at 8 PM she was in her usual state of health, this morning he thought she might be short of breath, when he tried to approach her she started to have seizure. It was reported that she had right-sided tonic-clonic movements and clonic contraction in the left side, she was given Versed by EMS. She does not have history of seizure before. She has history of paraophthalmic artery aneurysm clipping done by Dr. Franky Macho in 2011. Patient will be admitted to the hospital for further evaluation.   Review of Systems:  Unobtainable secondary to patient lethargy and confusion  Past Medical History  Diagnosis Date  . Dementia   . H/O cerebral aneurysm repair     in December of 2011   No past surgical history on file. Social History:  does not have a smoking history on file. She does not have any smokeless tobacco history on file. Her alcohol and drug histories not on file. Lives at home with her husband, good functional status per him  No Known Allergies  Family History  Problem Relation Age of Onset  . Leukemia Father     Died in his early 102s  . Alzheimer's disease Mother     Prior to Admission medications   Medication Sig Start Date End Date Taking? Authorizing Provider  donepezil (ARICEPT) 10 MG tablet Take 10 mg by mouth at bedtime.   Yes Historical Provider, MD  memantine (NAMENDA) 5 MG tablet Take 5 mg by mouth 2  (two) times daily.   Yes Historical Provider, MD   Physical Exam: Filed Vitals:   08/29/12 0543 08/29/12 0730 08/29/12 0815 08/29/12 0906  BP: 96/61 105/47 110/60 106/47  Pulse: 87 97 91 83  Temp: 98.7 F (37.1 C)   97.7 F (36.5 C)  TempSrc: Oral   Axillary  Resp: 18 16 22 20   Height:      Weight:      SpO2: 93% 99% 99% 97%    General appearance: Lethargic, easy to arouse Head: Normocephalic, without obvious abnormality, atraumatic  Eyes: conjunctivae/corneas clear. PERRL, EOM's intact. Fundi benign.  Nose: Nares normal. Septum midline. Mucosa normal. No drainage or sinus tenderness.  Throat: lips, mucosa, and tongue normal; teeth and gums normal  Neck: Supple, no masses, no cervical lymphadenopathy, no JVD appreciated, no meningeal signs Resp: clear to auscultation bilaterally  Chest wall: no tenderness  Cardio: regular rate and rhythm, S1, S2 normal, no murmur, click, rub or gallop  GI: soft, non-tender; bowel sounds normal; no masses, no organomegaly  Extremities: extremities normal, atraumatic, no cyanosis or edema  Skin: Skin color, texture, turgor normal. No rashes or lesions  Neurologic: Lethargic, easy to arouse, rest of neurological examination was not done because of lack of cooperation  Labs on Admission:  Basic Metabolic Panel:  Lab 08/29/12 8119 08/29/12 0534  NA 139 140  K 3.5 3.5  CL 104 104  CO2 -- 23  GLUCOSE 192*  197*  BUN 14 15  CREATININE 0.90 0.74  CALCIUM -- 9.0  MG -- --  PHOS -- --   Liver Function Tests:  Lab 08/29/12 0534  AST 18  ALT 14  ALKPHOS 48  BILITOT 0.3  PROT 6.4  ALBUMIN 3.7   No results found for this basename: LIPASE:5,AMYLASE:5 in the last 168 hours No results found for this basename: AMMONIA:5 in the last 168 hours CBC:  Lab 08/29/12 0547 08/29/12 0534  WBC -- 7.7  NEUTROABS -- 6.2  HGB 13.6 12.7  HCT 40.0 39.0  MCV -- 97.5  PLT -- 211   Cardiac Enzymes: No results found for this basename:  CKTOTAL:5,CKMB:5,CKMBINDEX:5,TROPONINI:5 in the last 168 hours  BNP (last 3 results) No results found for this basename: PROBNP:3 in the last 8760 hours CBG: No results found for this basename: GLUCAP:5 in the last 168 hours  Radiological Exams on Admission: Dg Chest 1 View  08/29/2012  *RADIOLOGY REPORT*  Clinical Data: Altered mental status; cough.  CHEST - 1 VIEW  Comparison: Chest radiograph performed 12/31/2010  Findings: The lungs are well-aerated and clear.  There is no evidence of focal opacification, pleural effusion or pneumothorax.  The cardiomediastinal silhouette is within normal limits.  No acute osseous abnormalities are seen.  IMPRESSION: No acute cardiopulmonary process seen.   Original Report Authenticated By: Tonia Ghent, M.D.    Ct Head Wo Contrast  08/29/2012  *RADIOLOGY REPORT*  Clinical Data: Altered mental status; difficulty breathing.  Right upper extremity weakness.  Aphasia.  Seizure-like activity.  CT HEAD WITHOUT CONTRAST  Technique:  Contiguous axial images were obtained from the base of the skull through the vertex without contrast.  Comparison: CT of the head performed 12/31/2010, and MRI/MRA of the brain performed 06/15/2010  Findings: There is no evidence of acute infarction, mass lesion, or intra- or extra-axial hemorrhage on CT.  Periventricular and subcortical white matter change likely reflects small vessel ischemic microangiopathy.  Aneurysm clips anterior to the circle of Willis are grossly unchanged in appearance.  The posterior fossa, including the cerebellum, brainstem and fourth ventricle, is within normal limits.  The third and lateral ventricles, and basal ganglia are unremarkable in appearance.  The cerebral hemispheres are symmetric in appearance, with normal gray- white differentiation.  No mass effect or midline shift is seen.  There is no evidence of fracture; postoperative change along the right frontotemporal calvarium is grossly stable.  The  visualized portions of the orbits are within normal limits.  The paranasal sinuses and mastoid air cells are well-aerated.  No significant soft tissue abnormalities are seen.  IMPRESSION:  1.  No acute intracranial pathology seen on CT. 2.  Mild small vessel ischemic microangiopathy. 3.  Postoperative changes again noted.   Original Report Authenticated By: Tonia Ghent, M.D.     EKG: Independently reviewed.   Assessment/Plan Principal Problem:  *Seizure Active Problems:  Dementia  H/O cerebral aneurysm repair   Seizure -New-onset witnessed seizure with tonic-clonic contractions. -No history of seizures, has history of right para-ophthalmic artery clipping. -Seizure was controlled with benzos (Versed). -Apparently patient got agitated while they were trying to place a Foley catheter, then she received Ativan. -Patient was still lethargic but easy to arouse when I saw her, not sure from postictal state or benzos she got. -I well ask neurology to evaluate her.  Dementia -Continue her previous medications  History of cerebral aneurysm repair -History of right paraophthalmic artery aneurysm clipping done by Dr. Franky Macho.  Code Status:  Full code Family Communication: Husband was at bedside, plan explained to him. Disposition Plan: Observation  Time spent: 70 minutes  [facility] Pager [phone]  If 7PM-7AM, please contact night-coverage [email or web address] Password [facility] 08/29/2012, 10:21 AM

## 2012-08-29 NOTE — Procedures (Signed)
Indication: Altered mental status  Risks of the procedure were dicussed with the patient's husband including post-LP headache, bleeding, infection, weakness/numbness of legs(radiculopathy), death.  The patient's husband agreed and telephone consent was obtained.   The patient was prepped and draped, and using sterile technique a 20 gauge quinke spinal needle, multiple attempts were made at the L5-S1 and L4-5 interspaces, however no fluid was obtained.   At this time, she is getting empiric treatment with acyclovir and will plan to request a LP under floroscopy tomorrow.

## 2012-08-29 NOTE — ED Notes (Signed)
Report given to Wes, rn.  meds given to pt per md order.  Pt biting and kicking at staff and husband.  Pt also pulling at iv and catheter.

## 2012-08-29 NOTE — ED Notes (Signed)
Attempted to place foley per md order.  Pt combative and this nurse attempted to  place but no urine return noted.

## 2012-08-29 NOTE — ED Notes (Signed)
Pt arrived via [facility].  Pt was found by husband this am with difficulty breathing.  Once ems was on the scene pt had seizure like activity.  Pt here in ed with right sided weakness and altered mental status. Pt was last seen normal at 8pm.

## 2012-08-30 ENCOUNTER — Encounter (HOSPITAL_COMMUNITY): Payer: Self-pay | Admitting: General Practice

## 2012-08-30 ENCOUNTER — Observation Stay (HOSPITAL_COMMUNITY): Payer: Medicare Other

## 2012-08-30 LAB — CBC
HCT: 35.7 % — ABNORMAL LOW (ref 36.0–46.0)
Hemoglobin: 12 g/dL (ref 12.0–15.0)
MCV: 98.9 fL (ref 78.0–100.0)
RBC: 3.61 MIL/uL — ABNORMAL LOW (ref 3.87–5.11)
WBC: 5.8 10*3/uL (ref 4.0–10.5)

## 2012-08-30 LAB — URINE CULTURE: Colony Count: 8000

## 2012-08-30 LAB — CSF CELL COUNT WITH DIFFERENTIAL
RBC Count, CSF: 52 /mm3 — ABNORMAL HIGH
WBC, CSF: 1 /mm3 (ref 0–5)

## 2012-08-30 LAB — BASIC METABOLIC PANEL
CO2: 28 mEq/L (ref 19–32)
Chloride: 107 mEq/L (ref 96–112)
Creatinine, Ser: 0.7 mg/dL (ref 0.50–1.10)
GFR calc Af Amer: >90 mL/min (ref >90)
Potassium: 3.3 mEq/L — ABNORMAL LOW (ref 3.5–5.1)
Sodium: 143 mEq/L (ref 135–145)

## 2012-08-30 LAB — CRYPTOCOCCAL ANTIGEN, CSF

## 2012-08-30 MED ORDER — POTASSIUM CHLORIDE 10 MEQ/100ML IV SOLN
10.0000 meq | INTRAVENOUS | Status: DC
  Filled 2012-08-30 (×4): qty 100

## 2012-08-30 MED ORDER — POTASSIUM CHLORIDE CRYS ER 20 MEQ PO TBCR
40.0000 meq | EXTENDED_RELEASE_TABLET | ORAL | Status: AC
  Administered 2012-08-30 (×2): 40 meq via ORAL
  Filled 2012-08-30 (×3): qty 2

## 2012-08-30 MED ORDER — LORAZEPAM 2 MG/ML IJ SOLN: 1.0000 mg | INTRAMUSCULAR | Status: DC | PRN

## 2012-08-30 NOTE — Progress Notes (Signed)
[facility] PROGRESS NOTE  Jaime Barry JYN:[id number] DOB: 1940/12/08 DOA: 08/29/2012 PCP: No primary provider on file.  Assessment/Plan: Seizure  -New-onset witnessed seizure with tonic-clonic contractions.  -No history of seizures, has history of right para-ophthalmic artery clipping.  -Seizure was controlled with benzos (Versed).  -No further seizures overnight 11/29 -? Secondary to possible herpes encephalitis versus CVA per neuro-awaiting LP per IR and MRI Dementia  -Continue her previous medications  Altered mental status -Secondary to above, awaiting LP per IR and MRI as above. History of cerebral aneurysm repair  -History of right paraophthalmic artery aneurysm clipping done by Dr. Franky Macho. Hypokalemia -Replace potassium Code Status: Full Family Communication: Husband at bedside Disposition Plan: To home when medically ready   Consultants:  Neurology  Procedures:  LP pending  Antibiotics:  Acyclovir started on 11/28  HPI/Subjective: Patient alert, some confusion, oriented x2. No further seizures overnight. Husband at bedside  Objective: Filed Vitals:   08/29/12 1511 08/29/12 2156 08/30/12 0600 08/30/12 0706  BP: 104/43 91/51 84/34  82/50  Pulse: 80 83 76   Temp: 98.7 F (37.1 C) 98 F (36.7 C) 98.4 F (36.9 C)   TempSrc:      Resp: 20 16 16    Height:      Weight:      SpO2: 98% 99% 100%     Intake/Output Summary (Last 24 hours) at 08/30/12 1042 Last data filed at 08/30/12 0700  Gross per 24 hour  Intake   1251 ml  Output   1100 ml  Net    151 ml   Filed Weights   08/29/12 0537  Weight: 63.504 kg (140 lb)    Exam:   General:  Elderly female, in no apparent distress alert and oriented x2  Cardiovascular: Regular rate and rhythm, normal S1-S2  Respiratory: Clear to auscultation bilaterally no crackles or wheezes  Abdomen: Soft, bowel sounds present nontender nondistended no organomegaly and no masses palpable  Neuro: Cranial  nerves 2-12 grossly intact, moving all extremities grossly, sensory grossly intact, nonfocal  Data Reviewed: Basic Metabolic Panel:  Lab 08/30/12 8657 08/29/12 0547 08/29/12 0534  NA 143 139 140  K 3.3* 3.5 3.5  CL 107 104 104  CO2 28 -- 23  GLUCOSE 97 192* 197*  BUN 6 14 15   CREATININE 0.70 0.90 0.74  CALCIUM 8.4 -- 9.0  MG -- -- --  PHOS -- -- --   Liver Function Tests:  Lab 08/29/12 0534  AST 18  ALT 14  ALKPHOS 48  BILITOT 0.3  PROT 6.4  ALBUMIN 3.7   No results found for this basename: LIPASE:5,AMYLASE:5 in the last 168 hours No results found for this basename: AMMONIA:5 in the last 168 hours CBC:  Lab 08/30/12 0615 08/29/12 0547 08/29/12 0534  WBC 5.8 -- 7.7  NEUTROABS -- -- 6.2  HGB 12.0 13.6 12.7  HCT 35.7* 40.0 39.0  MCV 98.9 -- 97.5  PLT 173 -- 211   Cardiac Enzymes: No results found for this basename: CKTOTAL:5,CKMB:5,CKMBINDEX:5,TROPONINI:5 in the last 168 hours BNP (last 3 results) No results found for this basename: PROBNP:3 in the last 8760 hours CBG: No results found for this basename: GLUCAP:5 in the last 168 hours  No results found for this or any previous visit (from the past 240 hour(s)).   Studies: Dg Chest 1 View  08/29/2012  *RADIOLOGY REPORT*  Clinical Data: Altered mental status; cough.  CHEST - 1 VIEW  Comparison: Chest radiograph performed 12/31/2010  Findings: The lungs are  well-aerated and clear.  There is no evidence of focal opacification, pleural effusion or pneumothorax.  The cardiomediastinal silhouette is within normal limits.  No acute osseous abnormalities are seen.  IMPRESSION: No acute cardiopulmonary process seen.   Original Report Authenticated By: Tonia Ghent, M.D.    Ct Head Wo Contrast  08/29/2012  *RADIOLOGY REPORT*  Clinical Data: Altered mental status; difficulty breathing.  Right upper extremity weakness.  Aphasia.  Seizure-like activity.  CT HEAD WITHOUT CONTRAST  Technique:  Contiguous axial images were  obtained from the base of the skull through the vertex without contrast.  Comparison: CT of the head performed 12/31/2010, and MRI/MRA of the brain performed 06/15/2010  Findings: There is no evidence of acute infarction, mass lesion, or intra- or extra-axial hemorrhage on CT.  Periventricular and subcortical white matter change likely reflects small vessel ischemic microangiopathy.  Aneurysm clips anterior to the circle of Willis are grossly unchanged in appearance.  The posterior fossa, including the cerebellum, brainstem and fourth ventricle, is within normal limits.  The third and lateral ventricles, and basal ganglia are unremarkable in appearance.  The cerebral hemispheres are symmetric in appearance, with normal gray- white differentiation.  No mass effect or midline shift is seen.  There is no evidence of fracture; postoperative change along the right frontotemporal calvarium is grossly stable.  The visualized portions of the orbits are within normal limits.  The paranasal sinuses and mastoid air cells are well-aerated.  No significant soft tissue abnormalities are seen.  IMPRESSION:  1.  No acute intracranial pathology seen on CT. 2.  Mild small vessel ischemic microangiopathy. 3.  Postoperative changes again noted.   Original Report Authenticated By: Tonia Ghent, M.D.     Scheduled Meds:   . [EXPIRED] sodium chloride   Intravenous STAT  . acyclovir  550 mg Intravenous Q8H  . donepezil  10 mg Oral QHS  . [COMPLETED] fosPHENYtoin (CEREBYX) IV  950 mg PE Intravenous Once  . memantine  5 mg Oral BID  . phenytoin (DILANTIN) IV  100 mg Intravenous Q8H  . sodium chloride  3 mL Intravenous Q12H  . [DISCONTINUED] heparin  5,000 Units Subcutaneous Q8H   Continuous Infusions:   . sodium chloride 75 mL/hr at 08/30/12 0700    Principal Problem:  *Seizure Active Problems:  Dementia  H/O cerebral aneurysm repair    Time spent:    Kela Millin  [facility] Pager  [phone]. If 8PM-8AM, please contact night-coverage at [email or web address], password [facility] 08/30/2012, 10:42 AM  LOS: 1 day

## 2012-08-30 NOTE — Procedures (Signed)
Successful fluoro-guided LP at L3-4.  8 mL clear CSF withdrawn and sent for laboratory evaluation.  Opening pressure 14 cm water.  No immediate complications.

## 2012-08-30 NOTE — Progress Notes (Signed)
Subjective: Patient is much improved from yesterday, but still significantly confused.   Exam: Filed Vitals:   08/30/12 0706  BP: 82/50  Pulse:   Temp:   Resp:    Gen: In bed, NAD MS: Awakens easily, follows commands(two fingers, thumb on left hand, wiggle toes) gives full name and husbands name. Gives year as [birth year] ZO:XWRUE, EOMI, responds to stimuli in both visual hemifields.  Motor: moves all extremities well with no apparent weakness Sensory:Endorses sensation in all 4 ext, but is unable to tell me if different from side to side.   Impression: 71 yo F with AMS. She has bitemporal sharp waves with asingle run of periodic temporal sharp waves on EEG which can be seen in herpes encephalitis and therefore she has been started on acyclovir and an LP was attempted at bedside, but no fluid was obtained. It is possible in someone with dementia to have prolonged post-ictal todd's phenomenon and it may be that she is still revoering from a prolonged seizure. It is also possible that she was predisposed to seizure and had one in the setting of acute stroke with resultant aphasia. Finally, infectious causes remain in the differential and she has been started on empiric acyclovir.   Recommendations: 1) MRI brain 2) LP under fluoroscopy today.  3) Continue acyclovir.   Ritta Slot, MD [facility] [phone]  If 7pm- 7am, please page neurology on call at [phone].

## 2012-08-30 NOTE — Progress Notes (Signed)
PT Cancellation Note  Patient Details Name: JANELL KEELING MRN: [id number] DOB: [birth year]   Cancelled Treatment:    Reason Eval/Treat Not Completed: Patient not medically ready (Pt currently on bedrest due to LP.)  Will hold PT evaluation today due to pt on bedrest for 4 hours after LP.  Will continue to follow.   Mannie Ohlin 08/30/2012, 2:20 PM Jake Shark, PT DPT [phone]

## 2012-08-30 NOTE — Progress Notes (Signed)
Utilization review completed. Cullin Dishman, RN, BSN. 

## 2012-08-31 DIAGNOSIS — I959 Hypotension, unspecified: Secondary | ICD-10-CM

## 2012-08-31 LAB — BASIC METABOLIC PANEL
BUN: 8 mg/dL (ref 6–23)
CO2: 26 mEq/L (ref 19–32)
Calcium: 8.6 mg/dL (ref 8.4–10.5)
Chloride: 110 mEq/L (ref 96–112)
Creatinine, Ser: 0.67 mg/dL (ref 0.50–1.10)
GFR calc Af Amer: >90 mL/min (ref >90)
GFR calc non Af Amer: 88 mL/min — ABNORMAL LOW (ref >90)
Glucose, Bld: 96 mg/dL (ref 70–99)
Potassium: 4 mEq/L (ref 3.5–5.1)
Sodium: 143 mEq/L (ref 135–145)

## 2012-08-31 LAB — HEMOGLOBIN AND HEMATOCRIT, BLOOD
HCT: 37.4 % (ref 36.0–46.0)
Hemoglobin: 12.2 g/dL (ref 12.0–15.0)

## 2012-08-31 MED ORDER — SODIUM CHLORIDE 0.9 % IV BOLUS (SEPSIS)
1000.0000 mL | Freq: Once | INTRAVENOUS | Status: AC
  Administered 2012-08-31: 1000 mL via INTRAVENOUS

## 2012-08-31 MED ORDER — ENOXAPARIN SODIUM 40 MG/0.4ML ~~LOC~~ SOLN
40.0000 mg | SUBCUTANEOUS | Status: DC
  Administered 2012-08-31 – 2012-09-01 (×2): 40 mg via SUBCUTANEOUS
  Filled 2012-08-31 (×2): qty 0.4

## 2012-08-31 MED ORDER — SODIUM CHLORIDE 0.9 % IV SOLN
1000.0000 mg | Freq: Once | INTRAVENOUS | Status: AC
  Administered 2012-08-31: 1000 mg via INTRAVENOUS
  Filled 2012-08-31 (×2): qty 10

## 2012-08-31 MED ORDER — SODIUM CHLORIDE 0.9 % IV SOLN
500.0000 mg | Freq: Two times a day (BID) | INTRAVENOUS | Status: DC
  Administered 2012-09-01: 500 mg via INTRAVENOUS
  Filled 2012-08-31 (×3): qty 5

## 2012-08-31 NOTE — Evaluation (Signed)
Occupational Therapy Evaluation Patient Details Name: Jaime Barry MRN: [id number] DOB: 1941/01/01 Today's Date: 08/31/2012 Time: 4098-1191 OT Time Calculation (min): 33 min  OT Assessment / Plan / Recommendation Clinical Impression  Thiis 71 yo admitted to seizure activity and having problems below. Will benefit from acute OT with follow up HHOT.    OT Assessment  Patient needs continued OT Services    Follow Up Recommendations  Home health OT;Supervision/Assistance - 24 hour    Barriers to Discharge Decreased caregiver support    Equipment Recommendations  Tub/shower seat;3 in 1 bedside comode (with back)       Frequency  Min 3X/week    Precautions / Restrictions Precautions Precautions: Fall Restrictions Weight Bearing Restrictions: No       ADL  Eating/Feeding: Simulated;Set up;Supervision/safety Where Assessed - Eating/Feeding: Edge of bed Grooming: Simulated;Min guard Where Assessed - Grooming: Unsupported sitting Upper Body Bathing: Simulated;Min guard Where Assessed - Upper Body Bathing: Unsupported sitting Lower Body Bathing: Simulated;Moderate assistance Where Assessed - Lower Body Bathing: Supported sit to stand Upper Body Dressing: Min guard;Simulated Where Assessed - Upper Body Dressing: Unsupported sitting Lower Body Dressing: Simulated;Moderate assistance Where Assessed - Lower Body Dressing: Supported sit to Pharmacist, hospital: Moderate assistance;Performed Toilet Transfer Method: Stand pivot Acupuncturist: Materials engineer and Hygiene: Performed;Min guard Where Assessed - Engineer, mining and Hygiene: Lean right and/or left Transfers/Ambulation Related to ADLs: Mod A for sit to stand and stand to sit due to off balance due to reports of dizziness.    OT Diagnosis: Generalized weakness;Cognitive deficits  OT Problem List: Decreased activity tolerance;Impaired balance (sitting and/or  standing);Decreased cognition;Decreased safety awareness;Decreased knowledge of use of DME or AE OT Treatment Interventions: Self-care/ADL training;DME and/or AE instruction;Patient/family education;Balance training   OT Goals Acute Rehab OT Goals OT Goal Formulation: With patient Time For Goal Achievement: 09/07/12 Potential to Achieve Goals: Good ADL Goals Pt Will Perform Upper Body Bathing: with set-up;with supervision;Unsupported;Sitting at sink;Standing at sink ADL Goal: Upper Body Bathing - Progress: Goal set today Pt Will Perform Lower Body Bathing: with set-up;with supervision;Unsupported;Standing at sink;Sitting at sink ADL Goal: Lower Body Bathing - Progress: Goal set today Pt Will Perform Upper Body Dressing: with set-up;with supervision;Sit to stand from bed;Sit to stand from chair;Unsupported ADL Goal: Upper Body Dressing - Progress: Goal set today Pt Will Perform Lower Body Dressing: with set-up;with supervision;Unsupported;Sit to stand from bed;Sit to stand from chair ADL Goal: Lower Body Dressing - Progress: Goal set today Pt Will Transfer to Toilet: with supervision;Ambulation;Comfort height toilet;Grab bars ADL Goal: Toilet Transfer - Progress: Goal set today Pt Will Perform Toileting - Clothing Manipulation: with set-up;with supervision;Standing ADL Goal: Toileting - Clothing Manipulation - Progress: Goal set today Pt Will Perform Toileting - Hygiene: with supervision;Sit to stand from 3-in-1/toilet ADL Goal: Toileting - Hygiene - Progress: Goal set today Pt Will Perform Tub/Shower Transfer: with supervision;Ambulation;with DME;Shower seat with back ADL Goal: Tub/Shower Transfer - Progress: Goal set today  Visit Information  Last OT Received On: 08/31/12 Assistance Needed: +1    Subjective Data  Subjective: You have a great day and God bless Patient Stated Goal: Husband wants to be able to have her at home (perhapes with adult daycare while he is at work)   Prior  Functioning     Home Living Lives With: Spouse Available Help at Discharge: Family;Other (Comment) (works during the day) Type of Home: House Home Access: Stairs to enter [employer] of Steps: 1 Entrance  Stairs-Rails: None Home Layout: One level Bathroom Shower/Tub: Tub/shower unit;Door [employer] Toilet: Handicapped height Bathroom Accessibility: Yes How Accessible: Accessible via walker Home Adaptive Equipment: None Prior Function Level of Independence: Independent Able to Take Stairs?: Yes Driving: No Vocation: Retired Musician: No difficulties Dominant Hand: Right         Vision/Perception Vision - Assessment Eye Alignment: Within Functional Limits   Cognition  Overall Cognitive Status: History of cognitive impairments - further impaired Area of Impairment: Memory;Awareness of errors;Problem solving Arousal/Alertness: Awake/alert Orientation Level: Disoriented to;Time Behavior During Session: Flat affect Memory Deficits: history of dementia Awareness of Errors: Assistance required to correct errors made Problem Solving: slow processing    Extremity/Trunk Assessment Right Upper Extremity Assessment RUE ROM/Strength/Tone: Within functional levels Left Upper Extremity Assessment LUE ROM/Strength/Tone: Within functional levels Right Lower Extremity Assessment RLE ROM/Strength/Tone: Within functional levels RLE Sensation: WFL - Light Touch Left Lower Extremity Assessment LLE ROM/Strength/Tone: Within functional levels LLE Sensation: WFL - Light Touch     Mobility Bed Mobility Bed Mobility: Supine to Sit;Sitting - Scoot to Edge of Bed Supine to Sit: 4: Min assist Sitting - Scoot to Edge of Bed: 4: Min guard Details for Bed Mobility Assistance: Min assist through trunk into sitting. Cues for sequencing Transfers Sit to Stand: 4: Min assist;With upper extremity assist;From bed;From chair/3-in-1 Stand to Sit: 4: Min assist;With  upper extremity assist;To chair/3-in-1 Details for Transfer Assistance: Assist during transfer for stability as pt with decreased balance upon standing and transfer. Cues for proper hand placement and sequencing.              End of Session OT - End of Session Activity Tolerance:  (Pt limited by dizziness) Patient left: in chair Nurse Communication: Mobility status       Evette Georges [phone] 08/31/2012, 9:56 AM

## 2012-08-31 NOTE — Progress Notes (Signed)
[facility] PROGRESS NOTE  JADIE ALLINGTON AOZ:[id number] DOB: 03-Feb-1941 DOA: 08/29/2012 PCP: No primary provider on file.  Assessment/Plan: Seizure  -New-onset witnessed seizure with tonic-clonic jerking.  -No history of seizures, has history of right para-ophthalmic artery clipping.  -Seizure was controlled with benzos (Versed).  -No further seizures overnight 11/29 -? Secondary to possible herpes encephalitis versus CVA per neuro-CSF with RBCs but only 1wbc. HSV pending, Pt much improved on acyclovir- Neuro to follow for further recs. Crypto antigen negative. -MRI negative Hypotension - IVF bolus, obtain cultures and follow. She is afebrile with no evidence of bleeding and chest pain free. Dementia  -Continue her previous medications  Altered mental status -clinically improved, LP as above and MRI neg. History of cerebral aneurysm repair  -History of right paraophthalmic artery aneurysm clipping done by Dr. Franky Macho. Hypokalemia -Replace potassium Code Status: Full Family Communication: Husband at bedside Disposition Plan: To home when medically ready   Consultants:  Neurology  Procedures:  LP pending  Antibiotics:  Acyclovir started on 11/28  HPI/Subjective: Patient alert and orientedx3 and appropriate, in no apparent distress. Sitting up in chair and c/o dizziness today with sitting up. Objective: Filed Vitals:   08/30/12 0706 08/30/12 1322 08/30/12 2131 08/31/12 0521  BP: 82/50 108/49 98/52 88/42   Pulse:  65 98 76  Temp:  98.2 F (36.8 C) 97.5 F (36.4 C) 98.1 F (36.7 C)  TempSrc:  Oral Oral Oral  Resp:  17 18 18   Height:      Weight:      SpO2:  98% 98% 95%    Intake/Output Summary (Last 24 hours) at 08/31/12 0902 Last data filed at 08/31/12 9629  Gross per 24 hour  Intake    660 ml  Output   1050 ml  Net   -390 ml   Filed Weights   08/29/12 0537  Weight: 63.504 kg (140 lb)    Exam:   General:  Elderly female, in no apparent  distress alert and oriented x3  Cardiovascular: Regular rate and rhythm, normal S1-S2  Respiratory: Clear to auscultation bilaterally no crackles or wheezes  Abdomen: Soft, bowel sounds present nontender nondistended no organomegaly and no masses palpable  Neuro: Cranial nerves 2-12 grossly intact, moving all extremities grossly, sensory grossly intact, nonfocal  Data Reviewed: Basic Metabolic Panel:  Lab 08/31/12 5284 08/30/12 0615 08/29/12 0547 08/29/12 0534  NA 143 143 139 140  K 4.0 3.3* 3.5 3.5  CL 110 107 104 104  CO2 26 28 -- 23  GLUCOSE 96 97 192* 197*  BUN 8 6 14 15   CREATININE 0.67 0.70 0.90 0.74  CALCIUM 8.6 8.4 -- 9.0  MG -- -- -- --  PHOS -- -- -- --   Liver Function Tests:  Lab 08/29/12 0534  AST 18  ALT 14  ALKPHOS 48  BILITOT 0.3  PROT 6.4  ALBUMIN 3.7   No results found for this basename: LIPASE:5,AMYLASE:5 in the last 168 hours No results found for this basename: AMMONIA:5 in the last 168 hours CBC:  Lab 08/30/12 0615 08/29/12 0547 08/29/12 0534  WBC 5.8 -- 7.7  NEUTROABS -- -- 6.2  HGB 12.0 13.6 12.7  HCT 35.7* 40.0 39.0  MCV 98.9 -- 97.5  PLT 173 -- 211   Cardiac Enzymes: No results found for this basename: CKTOTAL:5,CKMB:5,CKMBINDEX:5,TROPONINI:5 in the last 168 hours BNP (last 3 results) No results found for this basename: PROBNP:3 in the last 8760 hours CBG: No results found for this basename: GLUCAP:5 in  the last 168 hours  Recent Results (from the past 240 hour(s))  URINE CULTURE     Status: Normal   Collection Time   08/29/12  2:02 PM      Component Value Range Status Comment   Specimen Description URINE, CLEAN CATCH   Final    Special Requests NONE   Final    Culture  Setup Time 08/29/2012 22:38   Final    Colony Count 8,000 COLONIES/ML   Final    Culture INSIGNIFICANT GROWTH   Final    Report Status 08/30/2012 FINAL   Final      Studies: Mr Brain Wo Contrast  08/30/2012  *RADIOLOGY REPORT*  Clinical Data: Seizure.   History of aneurysm clip  MRI HEAD WITHOUT CONTRAST  Technique:  Multiplanar, multiecho pulse sequences of the brain and surrounding structures were obtained according to standard protocol without intravenous contrast.  Comparison: CT head 08/29/2012  Findings: The patient was not cooperative and was moving during the study and refused to complete the study.  The majority of the scan was performed.  There is a metal clip in the region of the distal right internal carotid artery from prior aneurysm clipping.  Negative for acute infarct.  Mild chronic microvascular ischemic changes in the white matter.  Ventricle size is normal.  No mass or hemorrhage is identified.  IMPRESSION: Aneurysm clip in the region of the distal right internal carotid artery.  No acute infarct or mass.  Incomplete study due to lack of patient cooperation.   Original Report Authenticated By: Janeece Riggers, M.D.    Dg Fluoro Guide Lumbar Puncture  08/30/2012  *RADIOLOGY REPORT*  Clinical Data:  Altered mental status  DIAGNOSTIC LUMBAR PUNCTURE UNDER FLUOROSCOPIC GUIDANCE  Fluoroscopy time: 13 seconds.  Technique:  Informed consent was obtained from the patient prior to the procedure, including potential complications of headache, allergy, and pain.   With the patient prone, the lower back was prepped with Betadine. 1% Lidocaine was used for local anesthesia.  Lumbar puncture was performed at the L3-4 level using a 22 gauge needle with return of clear CSF with an opening pressure of 14 cm water.   8 ml of CSF were obtained for laboratory studies.  The patient tolerated the procedure well and there were no apparent complications.  IMPRESSION: Successful fluoroscopic guided lumbar puncture at L3-4.  Opening pressure 14 cm water.   Original Report Authenticated By: Charline Bills, M.D.     Scheduled Meds:    . [EXPIRED] sodium chloride   Intravenous STAT  . acyclovir  550 mg Intravenous Q8H  . donepezil  10 mg Oral QHS  . memantine  5  mg Oral BID  . phenytoin (DILANTIN) IV  100 mg Intravenous Q8H  . [COMPLETED] potassium chloride  40 mEq Oral Q4H  . sodium chloride  3 mL Intravenous Q12H  . [DISCONTINUED] potassium chloride  10 mEq Intravenous Q1 Hr x 4  . [DISCONTINUED] potassium chloride  10 mEq Intravenous Q1 Hr x 4   Continuous Infusions:    . sodium chloride 75 mL/hr at 08/30/12 0700    Principal Problem:  *Seizure Active Problems:  Dementia  H/O cerebral aneurysm repair    Time spent:    Kela Millin  [facility] Pager [phone]. If 8PM-8AM, please contact night-coverage at [email or web address], password [facility] 08/31/2012, 9:02 AM  LOS: 2 days

## 2012-08-31 NOTE — Progress Notes (Signed)
Subjective: Patient continues to make dramatic recovery  Exam: Filed Vitals:   08/31/12 1413  BP: 96/48  Pulse: 90  Temp: 97.9 F (36.6 C)  Resp: 18   Gen: In bed, NAD MS: Awakens easily, Interacts appropriately. Has one time where she seems to have difficulty with word finding, but otherwise appears to be approaching baseilne. Oriented to month, year, place.  ZO:XWRUE, EOMI with bilateral nystagmus. VFF Motor: 5/5 strength throughout.  Sensory:Endorses intact sensation  MRI negative. LP normal white cells.   Impression: 71 yo F who presented with AMS. She has bitemporal sharp waves with asingle run of periodic temporal sharp waves on EEG which can be seen in herpes encephalitis and therefore she has been started on acyclovir and an LP was performed. I suspect that she has had a prolonged todd's pehnomenon. She does have some dizziness and I suspect this is 2/2 dilantin. I will change her to keppra today.   Recommendations: 1) discontinue dilantin 2) Keppra 1000mg  x1 then 500mg  BID.   Ritta Slot, MD [facility] [phone]  If 7pm- 7am, please page neurology on call at [phone].

## 2012-08-31 NOTE — Evaluation (Signed)
Physical Therapy Evaluation Patient Details Name: Jaime Barry MRN: [id number] DOB: December 18, 1940 Today's Date: 08/31/2012 Time: 6644-0347 PT Time Calculation (min): 33 min  PT Assessment / Plan / Recommendation Clinical Impression  Pt admitted s/p seizures of unknown cause. Pt with h/o dementia, although still with AMS and decreased speech. Mobility limited secondary to pt with description of dizziness upon sitting unresolved. Pt will benefit from skilled PT in the acute care setting in order to maximize functional mobility, strength and safety prior to d/c home    PT Assessment  Patient needs continued PT services    Follow Up Recommendations  Home health PT;Supervision/Assistance - 24 hour    Does the patient have the potential to tolerate intense rehabilitation      Barriers to Discharge        Equipment Recommendations  Other (comment) (TBD)    Recommendations for Other Services     Frequency Min 3X/week    Precautions / Restrictions Precautions Precautions: None Restrictions Weight Bearing Restrictions: No   Pertinent Vitals/Pain No pain      Mobility  Bed Mobility Bed Mobility: Supine to Sit;Sitting - Scoot to Edge of Bed Supine to Sit: 4: Min assist Sitting - Scoot to Edge of Bed: 4: Min guard Details for Bed Mobility Assistance: Min assist through trunk into sitting. Cues for sequencing Transfers Transfers: Sit to Stand;Stand to Sit;Stand Pivot Transfers Sit to Stand: 4: Min assist;With upper extremity assist;From bed;From chair/3-in-1 Stand to Sit: 4: Min assist;With upper extremity assist;To chair/3-in-1 Stand Pivot Transfers: 3: Mod assist Details for Transfer Assistance: Assist during transfer for stability as pt with decreased balance upon standing and transfer. Cues for proper hand placement and sequencing. Ambulation/Gait Ambulation/Gait Assistance: Not tested (comment) (pt complained of dizziness with movement)    Shoulder Instructions       Exercises     PT Diagnosis: Difficulty walking;Altered mental status  PT Problem List: Decreased activity tolerance;Decreased balance;Decreased mobility;Decreased cognition;Decreased knowledge of use of DME;Decreased safety awareness;Decreased knowledge of precautions PT Treatment Interventions: DME instruction;Gait training;Stair training;Functional mobility training;Therapeutic activities;Balance training;Cognitive remediation;Patient/family education   PT Goals Acute Rehab PT Goals PT Goal Formulation: With patient/family Time For Goal Achievement: 09/07/12 Potential to Achieve Goals: Fair Pt will go Supine/Side to Sit: with modified independence PT Goal: Supine/Side to Sit - Progress: Goal set today Pt will go Sit to Supine/Side: with modified independence PT Goal: Sit to Supine/Side - Progress: Goal set today Pt will go Sit to Stand: with modified independence PT Goal: Sit to Stand - Progress: Goal set today Pt will go Stand to Sit: with modified independence PT Goal: Stand to Sit - Progress: Goal set today Pt will Transfer Bed to Chair/Chair to Bed: with modified independence PT Transfer Goal: Bed to Chair/Chair to Bed - Progress: Goal set today Pt will Ambulate: >150 feet;with modified independence;with least restrictive assistive device PT Goal: Ambulate - Progress: Goal set today Pt will Go Up / Down Stairs: 1-2 stairs;with supervision PT Goal: Up/Down Stairs - Progress: Goal set today  Visit Information  Last PT Received On: 08/31/12 Assistance Needed: +1 PT/OT Co-Evaluation/Treatment: Yes    Subjective Data  Patient Stated Goal: to be able to walk again   Prior Functioning  Home Living Lives With: Spouse Available Help at Discharge: Family;Other (Comment) (works during the day) Type of Home: House Home Access: Stairs to enter [employer] of Steps: 1 Entrance Stairs-Rails: None Home Layout: One level Bathroom Shower/Tub: Tub/shower unit;Door [employer]  Toilet: Handicapped height Bathroom  Accessibility: Yes How Accessible: Accessible via walker Home Adaptive Equipment: None Prior Function Level of Independence: Independent Able to Take Stairs?: Yes Driving: No Vocation: Retired Musician: No difficulties Dominant Hand: Right    Cognition  Overall Cognitive Status: History of cognitive impairments - further impaired Area of Impairment: Memory;Awareness of errors;Problem solving Arousal/Alertness: Awake/alert Orientation Level: Disoriented to;Time Behavior During Session: Flat affect Memory Deficits: history of dementia Awareness of Errors: Assistance required to correct errors made Problem Solving: slow processing    Extremity/Trunk Assessment Right Lower Extremity Assessment RLE ROM/Strength/Tone: Within functional levels RLE Sensation: WFL - Light Touch Left Lower Extremity Assessment LLE ROM/Strength/Tone: Within functional levels LLE Sensation: WFL - Light Touch   Balance Balance Balance Assessed: Yes (during transfer)  End of Session PT - End of Session Equipment Utilized During Treatment: Gait belt Activity Tolerance: Other (comment) (limited secondary to dizziness) Patient left: in chair;with call bell/phone within reach;with family/visitor present Nurse Communication: Mobility status    Milana Kidney 08/31/2012, 9:27 AM  08/31/2012 Milana Kidney DPT PAGER: [phone] OFFICE: [phone]

## 2012-09-01 LAB — BASIC METABOLIC PANEL
BUN: 7 mg/dL (ref 6–23)
CO2: 27 mEq/L (ref 19–32)
Chloride: 109 mEq/L (ref 96–112)
GFR calc non Af Amer: 89 mL/min — ABNORMAL LOW (ref >90)
Glucose, Bld: 87 mg/dL (ref 70–99)
Potassium: 3.5 mEq/L (ref 3.5–5.1)
Sodium: 142 mEq/L (ref 135–145)

## 2012-09-01 LAB — HERPES SIMPLEX VIRUS(HSV) DNA BY PCR: HSV 1 DNA: NOT DETECTED

## 2012-09-01 LAB — CBC
HCT: 35.6 % — ABNORMAL LOW (ref 36.0–46.0)
Hemoglobin: 11.7 g/dL — ABNORMAL LOW (ref 12.0–15.0)
RBC: 3.58 MIL/uL — ABNORMAL LOW (ref 3.87–5.11)
WBC: 4.7 10*3/uL (ref 4.0–10.5)

## 2012-09-01 MED ORDER — SODIUM CHLORIDE 0.9 % IV BOLUS (SEPSIS)
500.0000 mL | Freq: Once | INTRAVENOUS | Status: AC
  Administered 2012-09-01: 500 mL via INTRAVENOUS

## 2012-09-01 MED ORDER — LEVETIRACETAM 500 MG PO TABS: 500.0000 mg | ORAL_TABLET | Freq: Two times a day (BID) | ORAL | Status: DC

## 2012-09-01 NOTE — Progress Notes (Addendum)
Subjective: Patient doing well again this morning. Complains about being moved rooms because she liked her previous nursing tech, Jaime Barry.   Exam: Filed Vitals:   09/01/12 0601  BP: 95/59  Pulse: 77  Temp: 97.7 F (36.5 C)  Resp: 18   Gen: In bed, NAD MS: Awakens easily, Interacts appropriately. Oriented to month, year, place. Unable to spell world backwards # of quarters in $2.75 = 7.  JY:NWGNF, EOMI with bilateral nystagmus. VFF Motor: 5/5 strength throughout.  Sensory:Endorses intact sensation  Impression: 72 yo F who presented with AMS. She has bitemporal sharp waves with asingle run of periodic temporal sharp waves on EEG which can be seen in herpes encephalitis and therefore she has been started on acyclovir and an LP was performed. I suspect that she has had a prolonged todd's phenomenon. She has bilateral temporal sharp waves which are new onset at 8. Though this could be related to dementia, an autoimmune cause may be considered if she continues to have seizures.   Recommendations: 1) Continue keppra 500mg  BID.  2) HSV by PCR is negative, can D/C acyclovir.  3) If continued seizure activity, would perform autoimmune epilepsy workup, but this does not need to be done as an inpatient.    Ritta Slot, MD [facility] [phone]  If 7pm- 7am, please page neurology on call at [phone].

## 2012-09-01 NOTE — Discharge Summary (Signed)
Physician Discharge Summary  Jaime Barry:[id number] DOB: 1940-12-29 DOA: 08/29/2012  PCP: No primary provider on file.  Admit date: 08/29/2012 Discharge date: 09/01/2012  Time spent: >55minutes  Recommendations for Outpatient Follow-up:      Follow-up Information    Follow up with [facility], MD. (in 1-2weeks, call for appt upon discharge)    Contact information:   [address] [place] [address] [phone]       Follow up with Lesly Dukes, MD. (in 1-2weeks, call for appt upon discharge)    Contact information:   [address], [address] [address] [facility] [place] [address] [phone]          Discharge Diagnoses:  Principal Problem:  *Seizure Active Problems:  Dementia  H/O cerebral aneurysm repair  Hypotension   Discharge Condition: IMPROVED/STABLE  Diet recommendation: regular  Filed Weights   08/29/12 0537  Weight: 63.504 kg (140 lb)    History of present illness:  Jaime Barry is a 71 y.o. female was past medical history of dementia, history of cerebral aneurysm clipping in December of 2011 who presented to the hospital because of seizure. Patient was confused not sure it was postictal or because of benzodiazepine she was given in the emergency department. The history was obtained from her husband, he said when she went to bed last night at 8 PM she was in her usual state of health, this morning he thought she might be short of breath, when he tried to approach her she started to have seizure. It was reported that she had right-sided tonic-clonic movements and clonic contraction in the left side, she was given Versed by EMS. She does not have history of seizure before. She has history of paraophthalmic artery aneurysm clipping done by Dr. Franky Macho in 2011. Patient will be admitted to the hospital for further evaluation.   Hospital Course:  Seizure  -New-onset witnessed seizure with tonic-clonic jerking.  -No  history of seizures, has history of right para-ophthalmic artery clipping.  -Seizure was controlled with benzos (Versed).  -No further seizures in the hospital -Workup included and LP which showed CSF with RBCs but only 1wbc. HSV negative. Crypto antigen negative.  -MRI negative. Neuro was consulted on admission and followed and also did an EEG which showed bitemporal wasting is a single runoff. It temporal sharp waves which could be seen in herpes encephalitis and the patient was empirically started on acyclovir, and as noted above the LP was done and HSV PCR came back negative today and the acyclovir was discontinued. From neuro standpoint okay to DC patient home and she is to follow up with neurology outpatient in one to 2 weeks. 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 Amada Jupiter stated that though the bilateral temporal sharp waves could be related to dementia, an autoimmune cause may be considered if she continues to have seizures. Hypotension  -BP stable status post fluid bolus. Blood is no growth to date and urine cultures with no significant growth. She has no leukocytosis and her hemoglobin is stable, and has remained chest pain-free. She is to follow up outpatient. Dementia  -Continue her previous medications  Altered mental status  -clinically improved, LP as above and MRI neg.  History of cerebral aneurysm repair  -History of right paraophthalmic artery aneurysm clipping done by Dr. Franky Macho.  Hypokalemia  -Resolved, potassium was replaced in the hospital.  Consultants:  Neurology Procedures:  LP -no evidence of bacterial meningitis,  HSV PCR negative, crypto antigen also negative. EEG-per neuro bitemporal sharp waves with a single run of periodic temporal sharp waves  Discharge Exam: Filed Vitals:   08/31/12 1413 08/31/12 2127 09/01/12 0601 09/01/12 1147  BP: 96/48 100/43 95/59 102/61  Pulse: 90 85 77 76  Temp: 97.9 F (36.6 C) 98.6 F (37 C)  97.7 F (36.5 C)   TempSrc: Oral Oral Oral   Resp: 18 18 18    Height:      Weight:      SpO2: 99% 97% 97%    Exam:  General: Elderly female, in no apparent distress alert and oriented x3  Cardiovascular: Regular rate and rhythm, normal S1-S2  Respiratory: Clear to auscultation bilaterally no crackles or wheezes  Abdomen: Soft, bowel sounds present nontender nondistended no organomegaly and no masses palpable  Neuro: Cranial nerves 2-12 grossly intact, moving all extremities grossly, sensory grossly intact, nonfocal     Discharge Instructions  Discharge Orders    Future Orders Please Complete By Expires   Diet general      Increase activity slowly          Medication List     As of 09/01/2012  1:00 PM    TAKE these medications         donepezil 10 MG tablet   Commonly known as: ARICEPT   Take 10 mg by mouth at bedtime.      levETIRAcetam 500 MG tablet   Commonly known as: KEPPRA   Take 1 tablet (500 mg total) by mouth 2 (two) times daily.      memantine 5 MG tablet   Commonly known as: NAMENDA   Take 5 mg by mouth 2 (two) times daily.           Follow-up Information    Follow up with [facility], MD. (in 1-2weeks, call for appt upon discharge)    Contact information:   [address] [place] [address] [phone]       Follow up with Lesly Dukes, MD. (in 1-2weeks, call for appt upon discharge)    Contact information:   [address], [address] [address] [facility] [address] [phone]           The results of significant diagnostics from this hospitalization (including imaging, microbiology, ancillary and laboratory) are listed below for reference.    Significant Diagnostic Studies: Dg Chest 1 View  08/29/2012  *RADIOLOGY REPORT*  Clinical Data: Altered mental status; cough.  CHEST - 1 VIEW  Comparison: Chest radiograph performed 12/31/2010  Findings: The lungs are well-aerated and clear.  There is no  evidence of focal opacification, pleural effusion or pneumothorax.  The cardiomediastinal silhouette is within normal limits.  No acute osseous abnormalities are seen.  IMPRESSION: No acute cardiopulmonary process seen.   Original Report Authenticated By: Tonia Ghent, M.D.    Ct Head Wo Contrast  08/29/2012  *RADIOLOGY REPORT*  Clinical Data: Altered mental status; difficulty breathing.  Right upper extremity weakness.  Aphasia.  Seizure-like activity.  CT HEAD WITHOUT CONTRAST  Technique:  Contiguous axial images were obtained from the base of the skull through the vertex without contrast.  Comparison: CT of the head performed 12/31/2010, and MRI/MRA of the brain performed 06/15/2010  Findings: There is no evidence of acute infarction, mass lesion, or intra- or extra-axial hemorrhage on CT.  Periventricular and subcortical white matter change likely reflects small vessel ischemic microangiopathy.  Aneurysm clips anterior to the circle of  Willis are grossly unchanged in appearance.  The posterior fossa, including the cerebellum, brainstem and fourth ventricle, is within normal limits.  The third and lateral ventricles, and basal ganglia are unremarkable in appearance.  The cerebral hemispheres are symmetric in appearance, with normal gray- white differentiation.  No mass effect or midline shift is seen.  There is no evidence of fracture; postoperative change along the right frontotemporal calvarium is grossly stable.  The visualized portions of the orbits are within normal limits.  The paranasal sinuses and mastoid air cells are well-aerated.  No significant soft tissue abnormalities are seen.  IMPRESSION:  1.  No acute intracranial pathology seen on CT. 2.  Mild small vessel ischemic microangiopathy. 3.  Postoperative changes again noted.   Original Report Authenticated By: Tonia Ghent, M.D.    Mr Brain Wo Contrast  08/30/2012  *RADIOLOGY REPORT*  Clinical Data: Seizure.  History of aneurysm clip  MRI  HEAD WITHOUT CONTRAST  Technique:  Multiplanar, multiecho pulse sequences of the brain and surrounding structures were obtained according to standard protocol without intravenous contrast.  Comparison: CT head 08/29/2012  Findings: The patient was not cooperative and was moving during the study and refused to complete the study.  The majority of the scan was performed.  There is a metal clip in the region of the distal right internal carotid artery from prior aneurysm clipping.  Negative for acute infarct.  Mild chronic microvascular ischemic changes in the white matter.  Ventricle size is normal.  No mass or hemorrhage is identified.  IMPRESSION: Aneurysm clip in the region of the distal right internal carotid artery.  No acute infarct or mass.  Incomplete study due to lack of patient cooperation.   Original Report Authenticated By: Janeece Riggers, M.D.    Dg Fluoro Guide Lumbar Puncture  08/30/2012  *RADIOLOGY REPORT*  Clinical Data:  Altered mental status  DIAGNOSTIC LUMBAR PUNCTURE UNDER FLUOROSCOPIC GUIDANCE  Fluoroscopy time: 13 seconds.  Technique:  Informed consent was obtained from the patient prior to the procedure, including potential complications of headache, allergy, and pain.   With the patient prone, the lower back was prepped with Betadine. 1% Lidocaine was used for local anesthesia.  Lumbar puncture was performed at the L3-4 level using a 22 gauge needle with return of clear CSF with an opening pressure of 14 cm water.   8 ml of CSF were obtained for laboratory studies.  The patient tolerated the procedure well and there were no apparent complications.  IMPRESSION: Successful fluoroscopic guided lumbar puncture at L3-4.  Opening pressure 14 cm water.   Original Report Authenticated By: Charline Bills, M.D.     Microbiology: Recent Results (from the past 240 hour(s))  URINE CULTURE     Status: Normal   Collection Time   08/29/12  2:02 PM      Component Value Range Status Comment    Specimen Description URINE, CLEAN CATCH   Final    Special Requests NONE   Final    Culture  Setup Time 08/29/2012 22:38   Final    Colony Count 8,000 COLONIES/ML   Final    Culture INSIGNIFICANT GROWTH   Final    Report Status 08/30/2012 FINAL   Final   CULTURE, BLOOD (ROUTINE X 2)     Status: Normal (Preliminary result)   Collection Time   08/31/12 11:30 AM      Component Value Range Status Comment   Specimen Description BLOOD ARM LEFT   Final  Special Requests BOTTLES DRAWN AEROBIC AND ANAEROBIC 10CC   Final    Culture  Setup Time 08/31/2012 18:04   Final    Culture     Final    Value:        BLOOD CULTURE RECEIVED NO GROWTH TO DATE CULTURE WILL BE HELD FOR 5 DAYS BEFORE ISSUING A FINAL NEGATIVE REPORT   Report Status PENDING   Incomplete      Labs: Basic Metabolic Panel:  Lab 09/01/12 5784 08/31/12 0440 08/30/12 0615 08/29/12 0547 08/29/12 0534  NA 142 143 143 139 140  K 3.5 4.0 3.3* 3.5 3.5  CL 109 110 107 104 104  CO2 27 26 28  -- 23  GLUCOSE 87 96 97 192* 197*  BUN 7 8 6 14 15   CREATININE 0.63 0.67 0.70 0.90 0.74  CALCIUM 8.5 8.6 8.4 -- 9.0  MG -- -- -- -- --  PHOS -- -- -- -- --   Liver Function Tests:  Lab 08/29/12 0534  AST 18  ALT 14  ALKPHOS 48  BILITOT 0.3  PROT 6.4  ALBUMIN 3.7   No results found for this basename: LIPASE:5,AMYLASE:5 in the last 168 hours No results found for this basename: AMMONIA:5 in the last 168 hours CBC:  Lab 09/01/12 0612 08/31/12 1704 08/30/12 0615 08/29/12 0547 08/29/12 0534  WBC 4.7 -- 5.8 -- 7.7  NEUTROABS -- -- -- -- 6.2  HGB 11.7* 12.2 12.0 13.6 12.7  HCT 35.6* 37.4 35.7* 40.0 39.0  MCV 99.4 -- 98.9 -- 97.5  PLT 169 -- 173 -- 211   Cardiac Enzymes: No results found for this basename: CKTOTAL:5,CKMB:5,CKMBINDEX:5,TROPONINI:5 in the last 168 hours BNP: BNP (last 3 results) No results found for this basename: PROBNP:3 in the last 8760 hours CBG: No results found for this basename: GLUCAP:5 in the last 168  hours     Signed:  Igor Bishop C  [facility] 09/01/2012, 1:00 PM

## 2012-09-01 NOTE — Progress Notes (Signed)
   CARE MANAGEMENT NOTE 09/01/2012  Patient:  Jaime Barry, Jaime Barry   Account Number:  [id number]  Date Initiated:  09/01/2012  Documentation initiated by:  [facility]  Subjective/Objective Assessment:   Seizure     Action/Plan:   lives at home with husband   Anticipated DC Date:  09/01/2012   Anticipated DC Plan:  HOME W HOME HEALTH SERVICES      DC Planning Services  CM consult      [facility] Choice  HOME HEALTH   Choice offered to / List presented to:  C-3 Spouse        HH arranged  HH - 11 Patient Refused      Status of service:  Completed, signed off Medicare Important Message given?   (If response is "NO", the following Medicare IM given date fields will be blank) Date Medicare IM given:   Date Additional Medicare IM given:    Discharge Disposition:  HOME/SELF CARE  Per UR Regulation:    If discussed at Long Length of Stay Meetings, dates discussed:    Comments:  09/01/2012 1500 NCM spoke to patient and gave permission to speak with husband. Husband states they do not want HH but requesting outpt PT at [facility]. Provided Rx from Unit RN. NCM will follow up with [facility] to see if any additional information needed for outpt PT on 12/2. Isidoro Donning RN CCM Case Mgmt phone [phone]

## 2012-09-03 NOTE — Progress Notes (Signed)
09/03/2012 1130 NCM called [facility] and they do not offer Outpt PT. NCM spoke to husband. States his wife went to PT in the same building. Explained [facility] is located in the same building. Requesting that location. NCM provided him with contact number. He will call and set up appts. He has written Rx from d/c MD. Faxed orders and facesheet to [facility] for Outpt PT. Isidoro Donning RN CCM Case Mgmt phone [phone]

## 2012-09-12 ENCOUNTER — Ambulatory Visit: Payer: Medicare Other | Attending: Internal Medicine | Admitting: Physical Therapy

## 2012-09-12 DIAGNOSIS — R269 Unspecified abnormalities of gait and mobility: Secondary | ICD-10-CM | POA: Insufficient documentation

## 2012-09-12 DIAGNOSIS — IMO0001 Reserved for inherently not codable concepts without codable children: Secondary | ICD-10-CM | POA: Insufficient documentation

## 2013-01-16 ENCOUNTER — Telehealth: Payer: Self-pay

## 2013-01-16 MED ORDER — MEMANTINE HCL ER 28 MG PO CP24: 28.0000 mg | ORAL_CAPSULE | Freq: Every day | ORAL | Status: DC

## 2013-01-16 NOTE — Telephone Encounter (Signed)
Spouse came into the office and spoke to front desk requesting a refill on Namenda XR.  Said they contacted the pharmacy and they said they would not fax [place] the request, said we needed to call them then they would fax [place] a request for refill.  I will just send the request to avoid any further delay.

## 2013-03-12 ENCOUNTER — Other Ambulatory Visit: Payer: Self-pay | Admitting: Family Medicine

## 2013-03-12 DIAGNOSIS — Z1231 Encounter for screening mammogram for malignant neoplasm of breast: Secondary | ICD-10-CM

## 2013-03-31 ENCOUNTER — Ambulatory Visit
Admission: RE | Admit: 2013-03-31 | Discharge: 2013-03-31 | Disposition: A | Discharge status: Home / Self Care | Payer: Medicare Other | Source: Ambulatory Visit | Attending: Family Medicine | Admitting: Family Medicine

## 2013-03-31 DIAGNOSIS — Z1231 Encounter for screening mammogram for malignant neoplasm of breast: Secondary | ICD-10-CM

## 2013-05-14 ENCOUNTER — Telehealth: Payer: Self-pay | Admitting: Neurology

## 2013-05-16 ENCOUNTER — Other Ambulatory Visit: Payer: Self-pay

## 2013-05-16 MED ORDER — MEMANTINE HCL 10 MG PO TABS: 10.0000 mg | ORAL_TABLET | Freq: Two times a day (BID) | ORAL | Status: DC

## 2013-05-16 MED ORDER — MEMANTINE HCL ER 28 MG PO CP24: 28.0000 mg | ORAL_CAPSULE | Freq: Every day | ORAL | Status: DC

## 2013-05-16 NOTE — Telephone Encounter (Signed)
Spouse says unable to get Namenda rx from pharmacy due to discontinuation of the particular form that was prescribe. Says he dropped off letter in office that suggest alternate med. He will be out of town next week and would like to have this resolved today because he will be unable to handle this while out of town.

## 2013-05-16 NOTE — Telephone Encounter (Signed)
I will switch the patient to short-acting Namenda.

## 2013-06-17 ENCOUNTER — Encounter: Payer: Self-pay | Admitting: Nurse Practitioner

## 2013-06-18 ENCOUNTER — Encounter: Payer: Self-pay | Admitting: Nurse Practitioner

## 2013-06-18 ENCOUNTER — Ambulatory Visit (INDEPENDENT_AMBULATORY_CARE_PROVIDER_SITE_OTHER): Payer: 59 | Admitting: Nurse Practitioner

## 2013-06-18 VITALS — BP 101/59 | HR 81 | Ht 64.0 in | Wt 115.0 lb

## 2013-06-18 DIAGNOSIS — G40309 Generalized idiopathic epilepsy and epileptic syndromes, not intractable, without status epilepticus: Secondary | ICD-10-CM

## 2013-06-18 DIAGNOSIS — F0280 Dementia in other diseases classified elsewhere without behavioral disturbance: Secondary | ICD-10-CM | POA: Insufficient documentation

## 2013-06-18 MED ORDER — LEVETIRACETAM 500 MG PO TABS: 500.0000 mg | ORAL_TABLET | Freq: Two times a day (BID) | ORAL | Status: DC

## 2013-06-18 MED ORDER — MEMANTINE HCL ER 28 MG PO CP24: 28.0000 mg | ORAL_CAPSULE | Freq: Every day | ORAL | Status: DC

## 2013-06-18 NOTE — Patient Instructions (Addendum)
Finish up the bottle of Namenda 10 mg twice daily then switched back to Namenda 28XR daily Continue Keppra 500 twice daily, will renew prescription Exercise daily by walking Followup in 6 months

## 2013-06-18 NOTE — Progress Notes (Signed)
[facility]  PATIENT: Jaime Barry DOB: [birth year]   REASON FOR VISIT: Followup for seizure disorder and memory loss   HISTORY OF PRESENT ILLNESS:  06/18/13: Patient returns for followup with husband. She has a history of progressive memory disorder. She has also had seizures in the past and is on Keppra without further seizure activity. She was on Aricept at one time but that has been tapered and discontinued. She is currently taking Namenda 10 twice a day because the Namenda  28mg  XR is on back order. The patient denies side effects to Keppra or her Namenda. The husband voices concern over our phone system as apparently he has called several times but I do not see any recent messages in the system. I encouraged him to speak with  our office manager .  History:  09/16/12.  Jaime Barry, 72 year old  right-handed female  has a history of progressive memory disturbance since 2008. The patient is on Aricept and Namenda at this time. The patient suffered a witnessed seizure on 08/29/2012 coming out of sleep. The patient was found by her husband around 4:30 AM with right-sided jerking. The patient was unresponsive at that time, and EMS was called. The patient continued to have some jerking of the right side for 10-15 minutes, but the total duration of the event is unclear. The patient went to the hospital, and a workup was done. The patient had an EEG study that showed right frontotemporal sharp waves. The patient had a MRI study of the brain that showed no acute changes, but the patient cannot cooperate for the whole study. A CT scan of brain showed mild small vessel disease and an aneurysm clip at the circle of Willis was noted. The patient was placed on Keppra, and she has done well with this. The patient has not had any residual numbness or weakness of the face, arms, or legs. The patient did have a transient problem with speech that has since cleared. The patient has not had problems  with seizures previously. The patient is sent to this office for further evaluation. A lumbar puncture was done in the hospital, and the results were unremarkable.   REVIEW OF SYSTEMS: Full 14 system review of systems performed and notable only for:  Constitutional: N/A  Cardiovascular: N/A  Ear/Nose/Throat: N/A  Skin: N/A  Eyes: N/A  Respiratory: N/A  Gastroitestinal: N/A  Hematology/Lymphatic: N/A  Endocrine: N/A Musculoskeletal:N/A  Allergy/Immunology: N/A  Neurological: Memory  Loss, seizure disorder  Psychiatric: N/A   ALLERGIES: No Known Allergies  HOME MEDICATIONS: Outpatient Prescriptions Prior to Visit  Medication Sig Dispense Refill  . levETIRAcetam (KEPPRA) 500 MG tablet Take 500 mg by mouth 2 (two) times daily.      . Memantine HCl ER (NAMENDA XR) 28 MG CP24 Take 28 mg by mouth daily.  30 capsule  6   No facility-administered medications prior to visit.    PAST MEDICAL HISTORY: Past Medical History  Diagnosis Date  . Seizure   . Dementia     progressive     PAST SURGICAL HISTORY: Past Surgical History  Procedure Laterality Date  . Tonsillectomy    . Artery aneurysm clipping Right     FAMILY HISTORY: Family History  Problem Relation Age of Onset  . Alzheimer's disease Mother   . Leukemia Father     SOCIAL HISTORY: History   Social History  . Marital Status: married    Spouse Name: N/A    Number of  Children: none  . Years of Education: Masters degree    Occupational History  . retired   Social History Main Topics  . Smoking status: Former Games developer  . Smokeless tobacco: Never Used  . Alcohol Use: No  . Drug Use: Yes  . Sexual Activity: Not on file   Other Topics Concern  . Not on file   Social History Narrative   The patient is married and lives with her husband.    Patient has her masters   Patient has no children.    Patient is retired.     PHYSICAL EXAM  Filed Vitals:   06/18/13 0904  BP: 101/59  Pulse: 81  Height: 5'  4" (1.626 m)  Weight: 115 lb (52.164 kg)   Body mass index is 19.73 kg/(m^2).  Generalized: Well developed, in no acute distress  Head: normocephalic and atraumatic,. Oropharynx benign  Neck: Supple, no carotid bruits  Cardiac: Regular rate rhythm, no murmur  Musculoskeletal: No deformity   Neurological examination   Mentation: Alert  MMSE 28/30. AFT 11.  Follows all commands speech and language fluent  Cranial nerve II-XII: Pupils were equal round reactive to light extraocular movements were full, visual field were full on confrontational test. Facial sensation and strength were normal. hearing was intact to finger rubbing bilaterally. Uvula tongue midline. head turning and shoulder shrug and were normal and symmetric.Tongue protrusion into cheek strength was normal. Motor: normal bulk and tone, full strength in the BUE, BLE, fine finger movements normal, no pronator drift. No focal weakness Sensory: normal and symmetric to light touch, pinprick, and  vibration  Coordination: finger-nose-finger, heel-to-shin bilaterally, no dysmetria Reflexes: Brachioradialis 2/2, biceps 2/2, triceps 2/2, patellar 2/2, Achilles 2/2, plantar responses were flexor bilaterally. Gait and Station: Rising up from seated position without assistance, normal stance, without trunk ataxia, moderate stride, good arm swing, smooth turning, able to perform tiptoe, and heel walking without difficulty. Tandem gait is steady  DIAGNOSTIC DATA (LABS, IMAGING, TESTING) - None to review   ASSESSMENT AND PLAN  72 y.o. year old female  has a past medical history of Seizure and Dementia. here for followup. Patient is currently on Namenda 10 twice daily due to a backorder at the pharmacy that the original order is for Namenda XR 28 mg daily. He is also taking Keppra 500 twice daily without further seizure activity.   Finish up the bottle of Namenda 10 mg twice daily then switched back to [place] 28XR daily. She was given Rx  for 1 free month with coupon.  Continue Keppra 500 twice daily, will renew prescription Exercise daily by walking Followup in 6 months  Nilda Riggs, [facility], [facility], APRN  [facility] [address], [address] [place], [place] [address] [phone]

## 2013-06-24 ENCOUNTER — Telehealth: Payer: Self-pay | Admitting: Neurology

## 2013-06-24 NOTE — Telephone Encounter (Signed)
I called the patient and I talked with the husband. They've had some issues contacting our office. Currently, we have a deficit in personnel answering telephone calls. We are working hard to correct the problem.  I have related this to Jaime Barry.

## 2013-07-21 NOTE — Progress Notes (Signed)
I reviewed note and agree with plan.   Suanne Marker, MD 07/21/2013, 10:52 PM Certified in Neurology, Neurophysiology and Neuroimaging  [facility] [address], [address] [place], [place] [address] [phone]

## 2013-08-07 ENCOUNTER — Other Ambulatory Visit: Payer: Self-pay

## 2013-10-07 ENCOUNTER — Encounter: Payer: Self-pay | Admitting: Nurse Practitioner

## 2013-10-13 ENCOUNTER — Other Ambulatory Visit: Payer: Self-pay | Admitting: Nurse Practitioner

## 2013-10-13 MED ORDER — MEMANTINE HCL ER 28 MG PO CP24: 28.0000 mg | ORAL_CAPSULE | Freq: Every day | ORAL | Status: DC

## 2013-10-30 ENCOUNTER — Other Ambulatory Visit: Payer: Self-pay | Admitting: Neurology

## 2013-10-30 NOTE — Telephone Encounter (Signed)
We already sent the pharmacy a Rx for the 28mg  XR version on 01/12.  I called the pharmacy.  They do have that Rx, this was sent in error.

## 2013-12-26 ENCOUNTER — Ambulatory Visit (INDEPENDENT_AMBULATORY_CARE_PROVIDER_SITE_OTHER): Payer: 59 | Admitting: Neurology

## 2013-12-26 ENCOUNTER — Encounter: Payer: Self-pay | Admitting: Neurology

## 2013-12-26 VITALS — BP 104/63 | HR 99 | Ht 64.0 in | Wt 115.0 lb

## 2013-12-26 DIAGNOSIS — G309 Alzheimer's disease, unspecified: Principal | ICD-10-CM

## 2013-12-26 DIAGNOSIS — F0280 Dementia in other diseases classified elsewhere without behavioral disturbance: Secondary | ICD-10-CM

## 2013-12-26 DIAGNOSIS — R569 Unspecified convulsions: Secondary | ICD-10-CM

## 2013-12-26 NOTE — Patient Instructions (Signed)
Epilepsy Epilepsy is a disorder in which a person has repeated seizures over time. A seizure is a release of abnormal electrical activity in the brain. Seizures can cause a change in attention, behavior, or the ability to remain awake and alert (altered mental status). Seizures often involve uncontrollable shaking (convulsions).  Most people with epilepsy lead normal lives. However, people with epilepsy are at an increased risk of falls, accidents, and injuries. Therefore, it is important to begin treatment right away. CAUSES  Epilepsy has many possible causes. Anything that disturbs the normal pattern of brain cell activity can lead to seizures. This may include:   Head injury.  Birth trauma.  High fever as a child.  Stroke.  Bleeding into or around the brain.  Certain drugs.  Prolonged low oxygen, such as what occurs after CPR efforts.  Abnormal brain development.  Certain illnesses, such as meningitis, encephalitis (brain infection), malaria, and other infections.  An imbalance of nerve signaling chemicals (neurotransmitters).  SIGNS AND SYMPTOMS  The symptoms of a seizure can vary greatly from one person to another. Right before a seizure, you may have a warning (aura) that a seizure is about to occur. An aura may include the following symptoms:  Fear or anxiety.  Nausea.  Feeling like the room is spinning (vertigo).  Vision changes, such as seeing flashing lights or spots. Common symptoms during a seizure include:  Abnormal sensations, such as an abnormal smell or a bitter taste in the mouth.   Sudden, general body stiffness.   Convulsions that involve rhythmic jerking of the face, arm, or leg on one or both sides.   Sudden change in consciousness.   Appearing to be awake but not responding.   Appearing to be asleep but cannot be awakened.   Grimacing, chewing, lip smacking, drooling, tongue biting, or loss of bowel or bladder control. After a seizure,  you may feel sleepy for a while. DIAGNOSIS  Your health care provider will ask about your symptoms and take a medical history. Descriptions from any witnesses to your seizures will be very helpful in the diagnosis. A physical exam, including a detailed neurological exam, is necessary. Various tests may be done, such as:   An electroencephalogram (EEG). This is a painless test of your brain waves. In this test, a diagram is created of your brain waves. These diagrams can be interpreted by a specialist.  An MRI of the brain.   A CT scan of the brain.   A spinal tap (lumbar puncture, LP).  Blood tests to check for signs of infection or abnormal blood chemistry. TREATMENT  There is no cure for epilepsy, but it is generally treatable. Once epilepsy is diagnosed, it is important to begin treatment as soon as possible. For most people with epilepsy, seizures can be controlled with medicines. The following may also be used:  A pacemaker for the brain (vagus nerve stimulator) can be used for people with seizures that are not well controlled by medicine.  Surgery on the brain. For some people, epilepsy eventually goes away. HOME CARE INSTRUCTIONS   Follow your health care provider's recommendations on driving and safety in normal activities.  Get enough rest. Lack of sleep can cause seizures.  Only take over-the-counter or prescription medicines as directed by your health care provider. Take any prescribed medicine exactly as directed.  Avoid any known triggers of your seizures.  Keep a seizure diary. Record what you recall about any seizure, especially any possible trigger.   Make   sure the people you live and work with know that you are prone to seizures. They should receive instructions on how to help you. In general, a witness to a seizure should:   Cushion your head and body.   Turn you on your side.   Avoid unnecessarily restraining you.   Not place anything inside your  mouth.   Call for emergency medical help if there is any question about what has occurred.   Follow up with your health care provider as directed. You may need regular blood tests to monitor the levels of your medicine.  SEEK MEDICAL CARE IF:   You develop signs of infection or other illness. This might increase the risk of a seizure.   You seem to be having more frequent seizures.   Your seizure pattern is changing.  SEEK IMMEDIATE MEDICAL CARE IF:   You have a seizure that does not stop after a few moments.   You have a seizure that causes any difficulty in breathing.   You have a seizure that results in a very severe headache.   You have a seizure that leaves you with the inability to speak or use a part of your body.  Document Released: 09/18/2005 Document Revised: 07/09/2013 Document Reviewed: 04/30/2013 [employer] Patient Information 2014 [employer].  

## 2013-12-26 NOTE — Progress Notes (Signed)
Reason for visit: Memory disorder  Jaime Barry is an 73 y.o. female  History of present illness:  Jaime Barry is a 73 year old right-handed white female with a history of a progressive memory disorder. The patient was on Aricept in the past, but she suffered a generalized seizure coming out of sleep. The patient had an abnormal EEG study, and she has been placed on Keppra. The patient is on 500 mg twice daily, and she is tolerating medication well. The patient has continued to have memory issues without significant progression since last seen. The patient manages her own medications, and is able to keep up with her appointments. The patient no longer operates a motor vehicle. No other new medical issues have come up since last seen. There have been no recurrent seizure events. The patient is off of Aricept, and she remains on Namenda taking 28 mg XR tablet once daily.  Past Medical History  Diagnosis Date  . Dementia   . H/O cerebral aneurysm repair     in December of 2011  . Seizures 08/29/2012  . Altered mental state 08/29/2012  . Seizure   . Dementia     progressive     Past Surgical History  Procedure Laterality Date  . Lumbar puncture  08/29/2012  . Cerebral aneurysm repair  09/2010    paraophthalmic artery /note 08/29/2012  . Tonsillectomy    . Artery aneurysm clipping Right     Family History  Problem Relation Age of Onset  . Alzheimer's disease Mother   . Leukemia Father     Died in his early 46s    Social history:  reports that she has quit smoking. She has never used smokeless tobacco. She reports that she drinks about 0.6 ounces of alcohol per week. She reports that she does not use illicit drugs.   No Known Allergies  Medications:  Current Outpatient Prescriptions on File Prior to Visit  Medication Sig Dispense Refill  . levETIRAcetam (KEPPRA) 500 MG tablet Take 1 tablet (500 mg total) by mouth 2 (two) times daily.  60 tablet  0  . Memantine HCl ER  (NAMENDA XR) 28 MG CP24 Take 28 mg by mouth daily.  30 capsule  6   No current facility-administered medications on file prior to visit.    ROS:  Out of a complete 14 system review of symptoms, the patient complains only of the following symptoms, and all other reviewed systems are negative.  Sinus disease Frequent waking Frequency of urination Memory loss  Blood pressure 104/63, pulse 99, height 5\' 4"  (1.626 m), weight 115 lb (52.164 kg).  Physical Exam  General: The patient is alert and cooperative at the time of the examination.  Skin: 1+ edema at the ankles is noted.   Neurologic Exam  Mental status: The patient is oriented x 3. Mini-Mental status examination done today shows a total score of 30/30. The animal fluency test was 12.  Cranial nerves: Facial symmetry is present. Speech is normal, no aphasia or dysarthria is noted. Extraocular movements are full. Visual fields are full.  Motor: The patient has good strength in all 4 extremities.  Sensory examination: Soft touch sensation is symmetric on the face, arms, and legs.  Coordination: The patient has good finger-nose-finger and heel-to-shin bilaterally.  Gait and station: The patient has a normal gait. Tandem gait is normal. Romberg is negative. No drift is seen.  Reflexes: Deep tendon reflexes are symmetric.   Assessment/Plan:  1. Progressive dementia  2. His seizures  The patient will continue the Namenda and the Aricept for now. The patient will followup through this office in about 8 months. The patient has done well with the seizure control at this time. The patient is also on zinc supplementation.  Jaime Barry. Jaime Willis MD 12/27/2013 4:15 PM  [facility] [address] [place], [place] [id number]  Phone [phone] Fax [phone]

## 2014-01-25 ENCOUNTER — Other Ambulatory Visit: Payer: Self-pay | Admitting: Neurology

## 2014-02-06 ENCOUNTER — Other Ambulatory Visit: Payer: Self-pay | Admitting: Nurse Practitioner

## 2014-08-12 ENCOUNTER — Other Ambulatory Visit: Payer: Self-pay | Admitting: Neurology

## 2014-08-26 ENCOUNTER — Ambulatory Visit (INDEPENDENT_AMBULATORY_CARE_PROVIDER_SITE_OTHER): Payer: 59 | Admitting: Neurology

## 2014-08-26 ENCOUNTER — Encounter: Payer: Self-pay | Admitting: Neurology

## 2014-08-26 VITALS — BP 87/61 | HR 84 | Ht 63.0 in | Wt 111.0 lb

## 2014-08-26 DIAGNOSIS — F0280 Dementia in other diseases classified elsewhere without behavioral disturbance: Secondary | ICD-10-CM

## 2014-08-26 DIAGNOSIS — R569 Unspecified convulsions: Secondary | ICD-10-CM

## 2014-08-26 DIAGNOSIS — G309 Alzheimer's disease, unspecified: Secondary | ICD-10-CM

## 2014-08-26 MED ORDER — MEMANTINE HCL ER 28 MG PO CP24: 1.0000 | ORAL_CAPSULE | Freq: Every day | ORAL | Status: DC

## 2014-08-26 MED ORDER — LEVETIRACETAM 500 MG PO TABS: 500.0000 mg | ORAL_TABLET | Freq: Two times a day (BID) | ORAL | Status: DC

## 2014-08-26 NOTE — Progress Notes (Signed)
Reason for visit: Dementia  Jaime Barry is an 73 y.o. female  History of present illness:  Jaime Barry is a 73 year old right-handed white female with a history of a progressive dementing illness. The patient has been followed through psychiatry, and Dr. Donell BeersPlovsky has recently added Depakote to her regimen. The patient is also on Namenda and Keppra. She has not had any further seizures. The husband indicates that there has been some progression her short-term memory, but the patient has not given up any activities of daily living since last seen. The last note from March 2015 incorrectly states that she is on Aricept, but she is not on this medication given the history of seizures. The patient does not operate a motor vehicle. She manages her own medications, but she does need some help with keeping up with appointments. The patient has an unusual affect, frequently making inappropriate remarks. She returns to this office for an evaluation. The patient has had one minor fall since last seen, she fell when she stooped over to pick up something.  Past Medical History  Diagnosis Date  . Dementia   . H/O cerebral aneurysm repair     in December of 2011  . Seizures 08/29/2012  . Altered mental state 08/29/2012  . Seizure   . Dementia     progressive     Past Surgical History  Procedure Laterality Date  . Lumbar puncture  08/29/2012  . Cerebral aneurysm repair  09/2010    paraophthalmic artery /note 08/29/2012  . Tonsillectomy    . Artery aneurysm clipping Right     Family History  Problem Relation Age of Onset  . Alzheimer's disease Mother   . Leukemia Father     Died in his early 5450s    Social history:  reports that she has quit smoking. She has never used smokeless tobacco. She reports that she drinks about 0.6 oz of alcohol per week. She reports that she does not use illicit drugs.   No Known Allergies  Medications:  No current outpatient prescriptions on file prior to  visit.   No current facility-administered medications on file prior to visit.    ROS:  Out of a complete 14 system review of symptoms, the patient complains only of the following symptoms, and all other reviewed systems are negative.  Frequency of urination Back pain Memory loss  Blood pressure 87/61, pulse 84, height 5\' 3"  (1.6 m), weight 111 lb (50.349 kg).  Physical Exam  General: The patient is alert and cooperative at the time of the examination.  Skin: No significant peripheral edema is noted.   Neurologic Exam  Mental status: The Mini-Mental Status Examination done today reveals a total score of 26/30.  Cranial nerves: Facial symmetry is present. Speech is normal, no aphasia or dysarthria is noted. Extraocular movements are full. Visual fields are full.  Motor: The patient has good strength in all 4 extremities.  Sensory examination: Soft touch sensation on the face, arms, and legs is symmetric.  Coordination: The patient has good finger-nose-finger and heel-to-shin bilaterally.  Gait and station: The patient has a normal gait. Tandem gait is minimally unsteady. Romberg is negative. No drift is seen.  Reflexes: Deep tendon reflexes are symmetric.   Assessment/Plan:  1. History of memory disorder  2. History of seizures  The patient will remain on Keppra and Namenda for now. She will follow-up in 6-8 months. The patient appears to be progressing relatively slowly with her memory issues.  Marlan Palau. Keith Willis MD 08/26/2014 7:31 PM  [facility] [address] [place], [place] [id number]  Phone [phone] Fax [phone]

## 2014-08-26 NOTE — Patient Instructions (Signed)

## 2014-09-09 IMAGING — CT CT HEAD W/O CM
1 series · 15 of 30 positions shown, 19 images · non-contrast
Comparison: CT of the head performed 12/31/2010, and MRI/MRA of the
brain performed 06/15/2010

CLINICAL DATA: Altered mental status; difficulty breathing.  Right
upper extremity weakness.  Aphasia.  Seizure-like activity.

CT HEAD WITHOUT CONTRAST
TECHNIQUE: Contiguous axial images were obtained from the base of
the skull through the vertex without contrast.

[Series 2: fast head · axial · 0.47mm/px · z∈[+143,+281]mm · 15 of 52 slices shown, 19 images]
[im 2/52  brain]
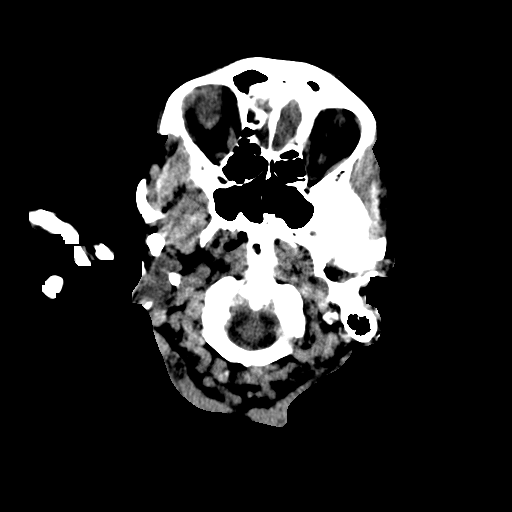
[im 2/52  bone]
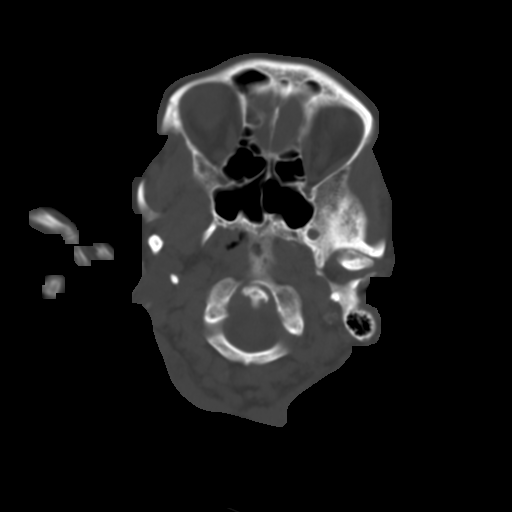
[im 6/52  brain]
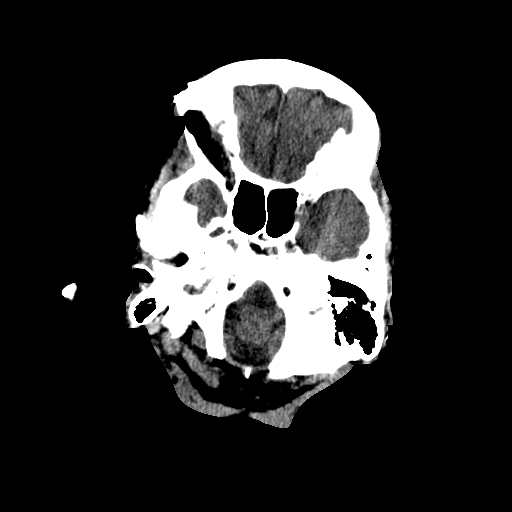
[im 9/52  brain]
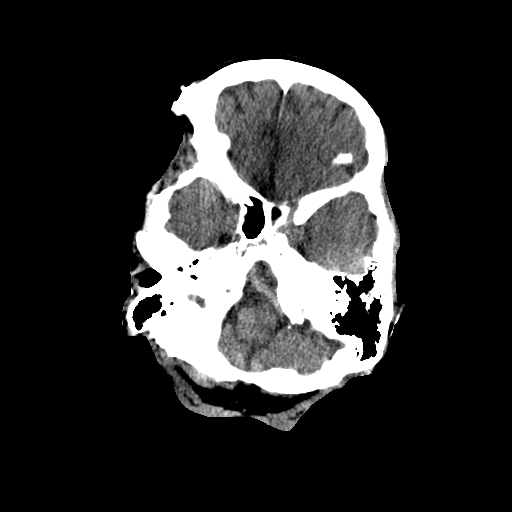
[im 13/52  brain]
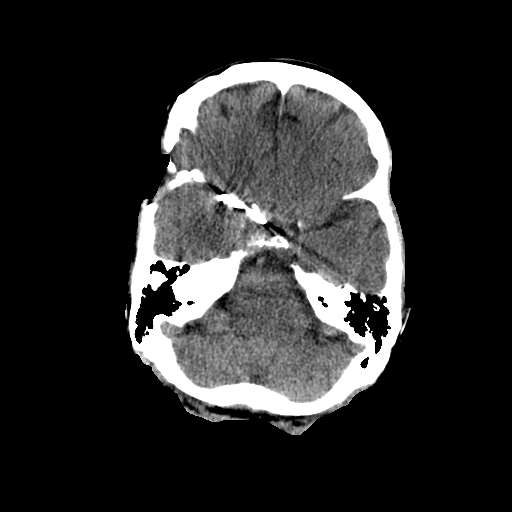
[im 16/52  brain]
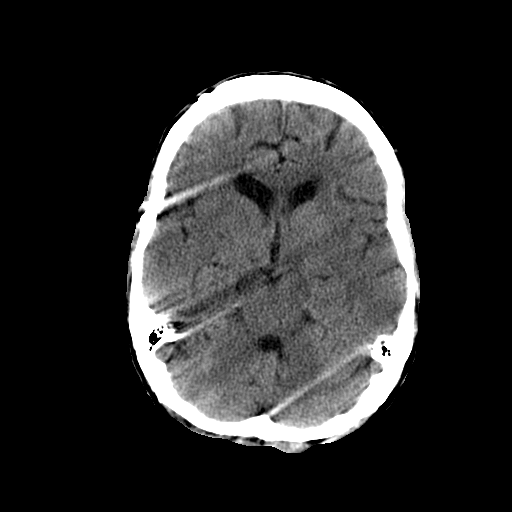
[im 16/52  bone]
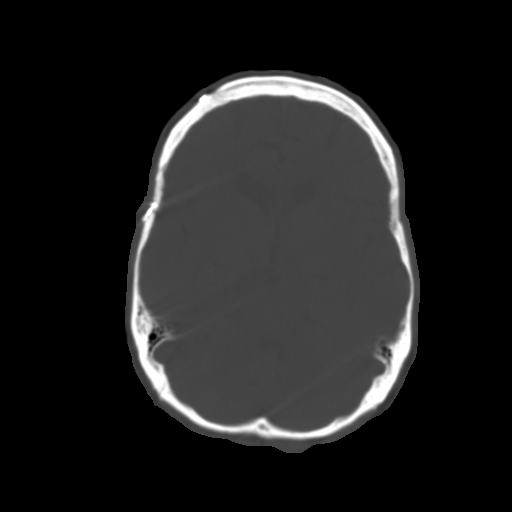
[im 20/52  brain]
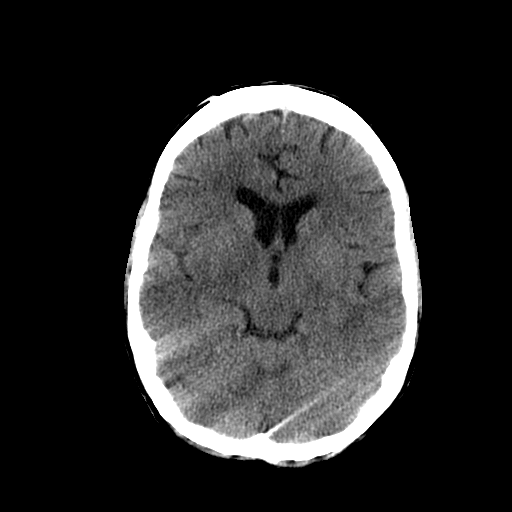
[im 23/52  brain]
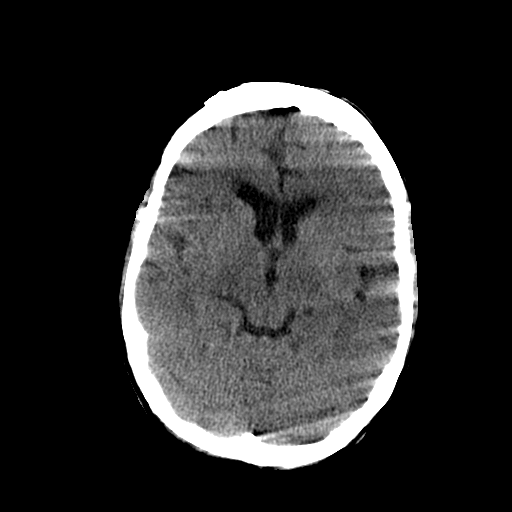
[im 27/52  brain]
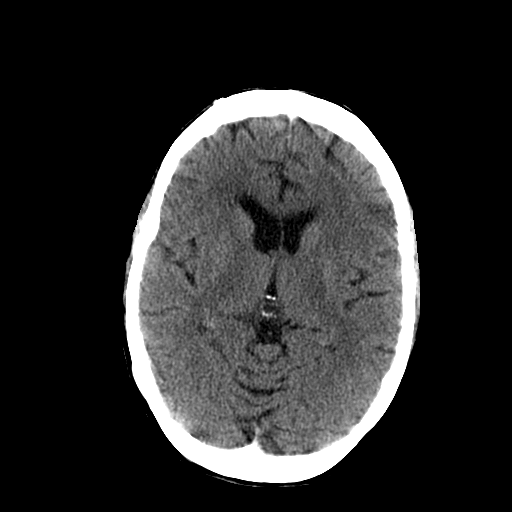
[im 29/52  brain]
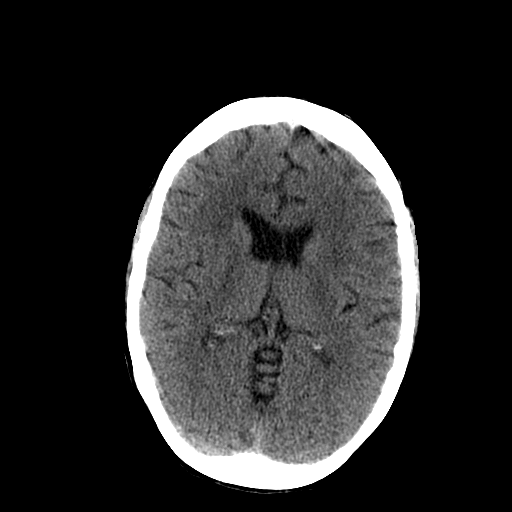
[im 29/52  bone]
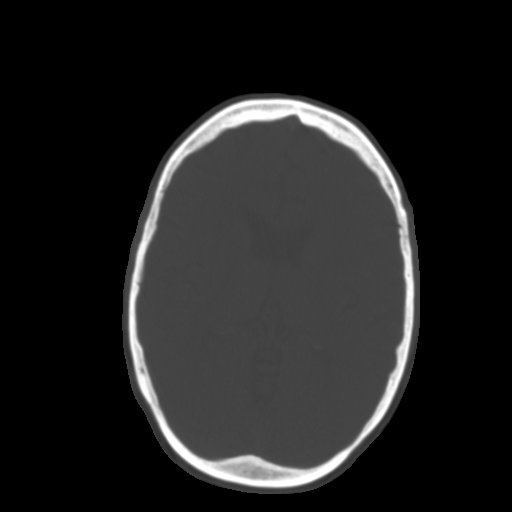
[im 32/52  brain]
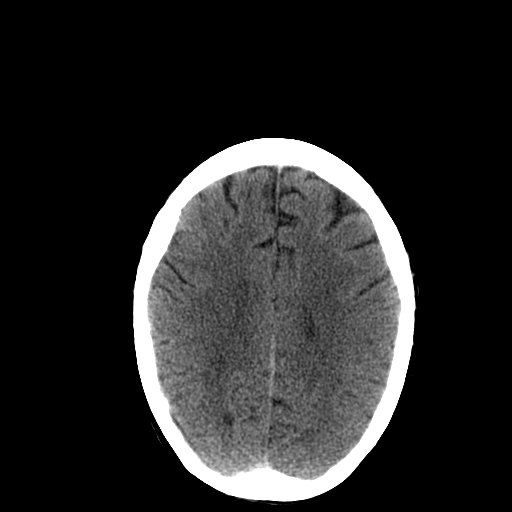
[im 36/52  brain]
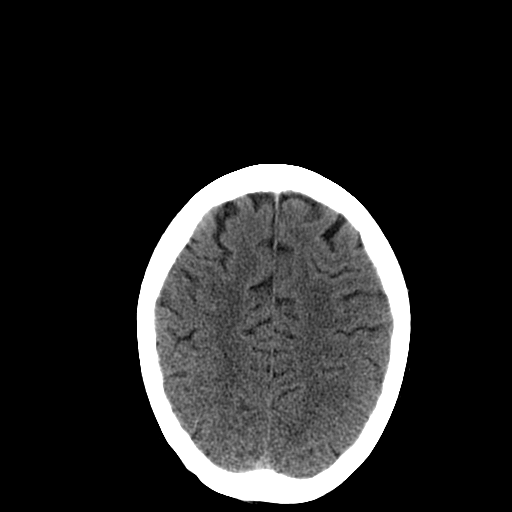
[im 39/52  brain]
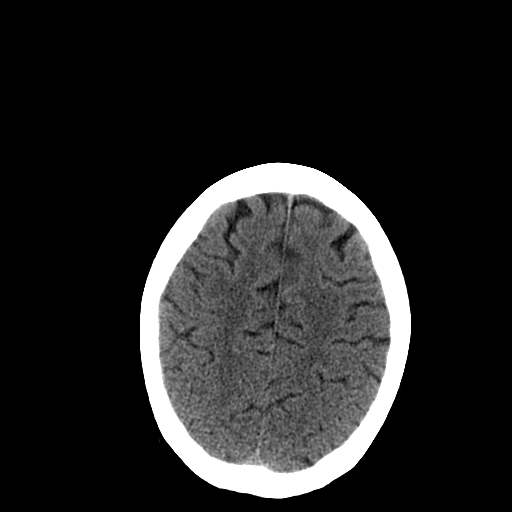
[im 43/52  brain]
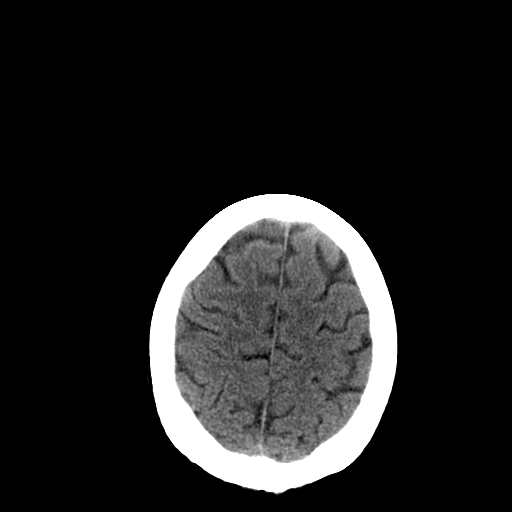
[im 43/52  bone]
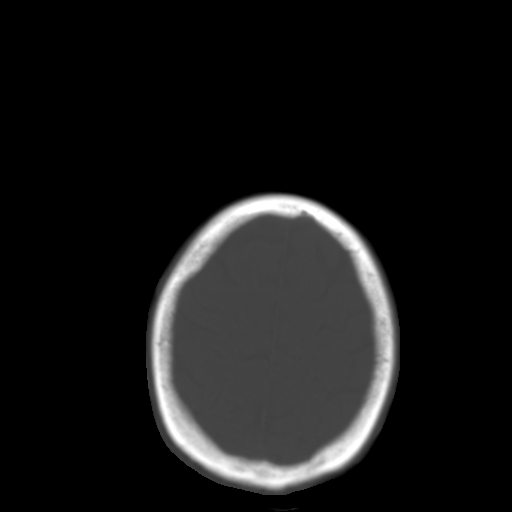
[im 46/52  brain]
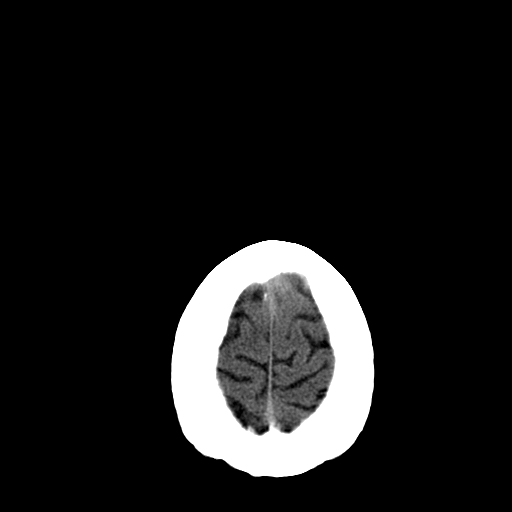
[im 50/52  brain]
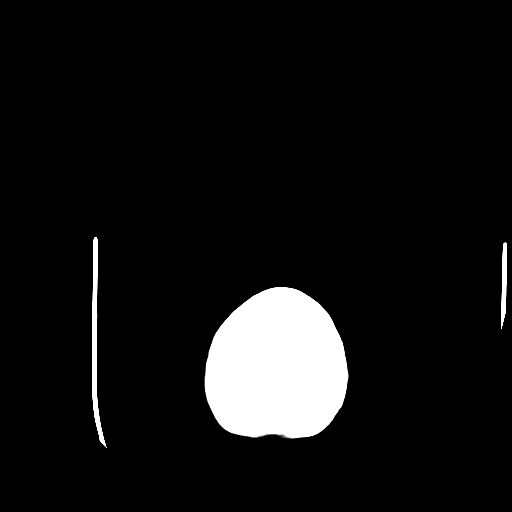

[15 of 30 positions shown; findings below may reference images not displayed]

FINDINGS: There is no evidence of acute infarction, mass lesion, or
intra- or extra-axial hemorrhage on CT.

Periventricular and subcortical white matter change likely reflects
small vessel ischemic microangiopathy.  Aneurysm clips anterior to
the circle of Willis are grossly unchanged in appearance.

The posterior fossa, including the cerebellum, brainstem and fourth
ventricle, is within normal limits.  The third and lateral
ventricles, and basal ganglia are unremarkable in appearance.  The
cerebral hemispheres are symmetric in appearance, with normal gray-
white differentiation.  No mass effect or midline shift is seen.

There is no evidence of fracture; postoperative change along the
right frontotemporal calvarium is grossly stable.  The visualized
portions of the orbits are within normal limits.  The paranasal
sinuses and mastoid air cells are well-aerated.  No significant
soft tissue abnormalities are seen.
IMPRESSION: 1.  No acute intracranial pathology seen on CT.
2.  Mild small vessel ischemic microangiopathy.
3.  Postoperative changes again noted.

## 2014-09-10 IMAGING — RF DG FLUORO GUIDE LUMBAR PUNCTURE
1 series · 1 of 1 positions shown · non-contrast
Comparison: none

CLINICAL DATA: Altered mental status

DIAGNOSTIC LUMBAR PUNCTURE UNDER FLUOROSCOPIC GUIDANCE
Fluoroscopy time: 13 seconds.
TECHNIQUE: Informed consent was obtained from the patient prior to
the procedure, including potential complications of headache,
allergy, and pain.

[Series 1: run · 1 of 1 slices shown]
[im 1/1]
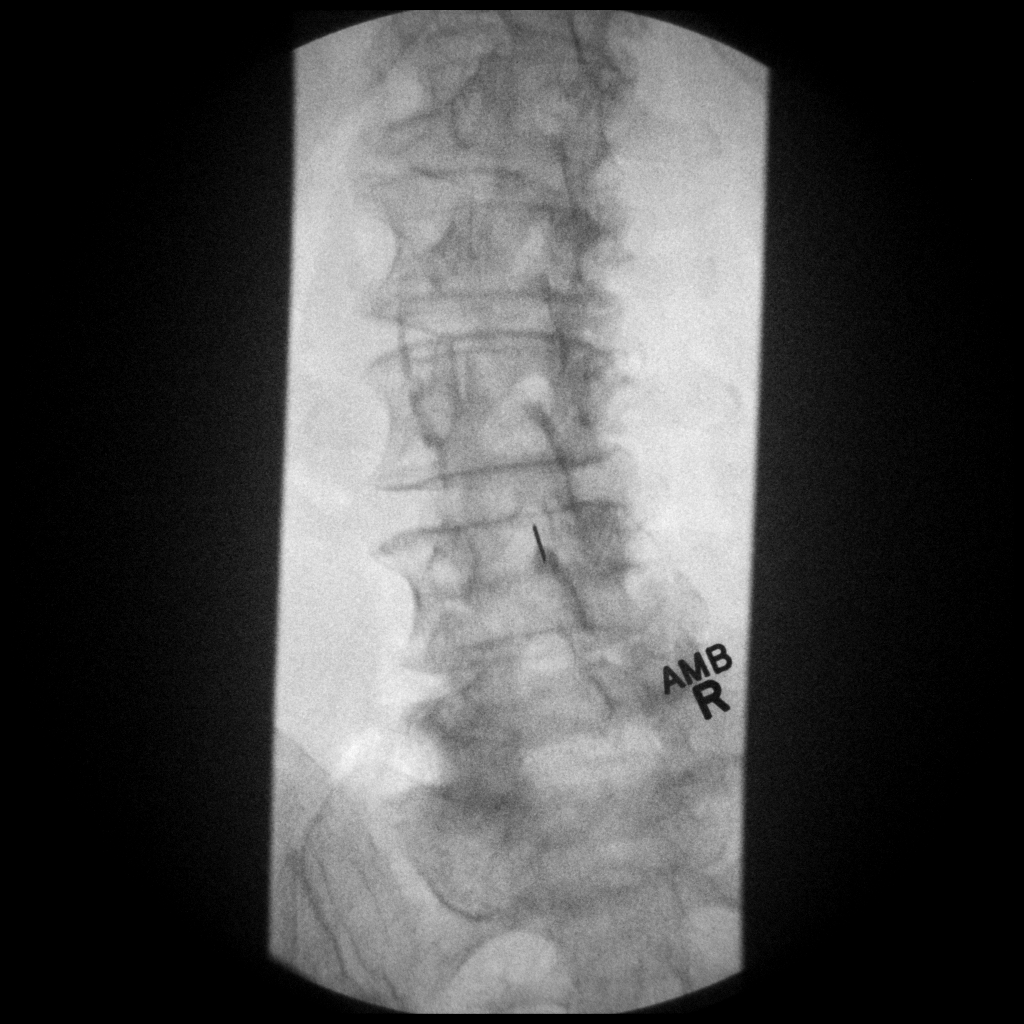

[1 of 1 positions shown; findings below may reference images not displayed]

With the patient prone, the lower back was prepped with Betadine.
1% Lidocaine was used for local anesthesia.  Lumbar puncture was
performed at the L3-4 level using a 22 gauge needle with return of
clear CSF with an opening pressure of 14 cm water.   8 ml of CSF
were obtained for laboratory studies.

The patient tolerated the procedure well and there were no apparent
complications.
IMPRESSION: Successful fluoroscopic guided lumbar puncture at L3-4.

Opening pressure 14 cm water.

## 2015-03-29 ENCOUNTER — Ambulatory Visit (INDEPENDENT_AMBULATORY_CARE_PROVIDER_SITE_OTHER): Payer: Medicare Other | Admitting: Adult Health

## 2015-03-29 ENCOUNTER — Encounter: Payer: Self-pay | Admitting: Adult Health

## 2015-03-29 VITALS — BP 104/64 | HR 62 | Ht 63.0 in | Wt 120.0 lb

## 2015-03-29 DIAGNOSIS — G40309 Generalized idiopathic epilepsy and epileptic syndromes, not intractable, without status epilepticus: Secondary | ICD-10-CM | POA: Diagnosis not present

## 2015-03-29 DIAGNOSIS — G309 Alzheimer's disease, unspecified: Secondary | ICD-10-CM

## 2015-03-29 DIAGNOSIS — F0280 Dementia in other diseases classified elsewhere without behavioral disturbance: Secondary | ICD-10-CM

## 2015-03-29 NOTE — Progress Notes (Signed)
I have read the note, and I agree with the clinical assessment and plan.  Ma Munoz KEITH   

## 2015-03-29 NOTE — Progress Notes (Signed)
PATIENT: Jaime Barry DOB: 11/27/1940    REASON FOR VISIT: foll ow up- Alzheimer's disease, seizures HISTORY FROM: patient    HISTORY OF PRESENT ILLNESS: Ms. Jaime Barry is a 74 year old female with a history of a progressive dementing illness and seizures. The patient returns today for follow-up. The patient is currently taking Namenda and tolerating it well. She states her memory has remained the same. She no longer operates a motor vehicle. She is able to complete all ADLs independently. Her husband states that there are some things that she can do but refuses. For example she can make her own coffee however she waits for her husband to do it for her. She is also taking Keppra for seizures. She denies any seizure events. Her psychiatrist has placed her on Depakote and carbamazepine for behavior issues. Husband feels that the medication has helped. She states that he checks her blood work regularly. Patient states that she has had several falls since the last visit. She states that she fell in her house twice due to tripping over objects. States that she is a Engineer, maintenance (IT)"horder." Her husband states that they don't even have a path to get through the house. Apparently they have someone that is helping them clean and organize their home. She returns today for follow-up.  HISTORY 08/26/14 (WILLIS): Ms. Jaime Barry is a 74 year old right-handed white female with a history of a progressive dementing illness. The patient has been followed through psychiatry, and Dr. Donell BeersPlovsky has recently added Depakote to her regimen. The patient is also on Namenda and Keppra. She has not had any further seizures. The husband indicates that there has been some progression her short-term memory, but the patient has not given up any activities of daily living since last seen. The last note from March 2015 incorrectly states that she is on Aricept, but she is not on this medication given the history of seizures. The patient does not operate a  motor vehicle. She manages her own medications, but she does need some help with keeping up with appointments. The patient has an unusual affect, frequently making inappropriate remarks. She returns to this office for an evaluation. The patient has had one minor fall since last seen, she fell when she stooped over to pick up something.  REVIEW OF SYSTEMS: Out of a complete 14 system review of symptoms, the patient complains only of the following symptoms, and all other reviewed systems are negative.   back pain, walking difficulty   ALLERGIES: No Known Allergies  HOME MEDICATIONS: Outpatient Prescriptions Prior to Visit  Medication Sig Dispense Refill  . divalproex (DEPAKOTE ER) 250 MG 24 hr tablet Take 250 mg by mouth. Three tabs in morning    . levETIRAcetam (KEPPRA) 500 MG tablet Take 1 tablet (500 mg total) by mouth 2 (two) times daily. 60 tablet 11  . Memantine HCl ER (NAMENDA XR) 28 MG CP24 Take 28 mg by mouth daily. 30 capsule 11  . ZINC PICOLINATE PO Take 50 mg by mouth daily.     No facility-administered medications prior to visit.    PAST MEDICAL HISTORY: Past Medical History  Diagnosis Date  . Dementia   . H/O cerebral aneurysm repair     in December of 2011  . Seizures 08/29/2012  . Altered mental state 08/29/2012  . Seizure   . Dementia     progressive     PAST SURGICAL HISTORY: Past Surgical History  Procedure Laterality Date  . Lumbar puncture  08/29/2012  .  Cerebral aneurysm repair  09/2010    paraophthalmic artery /note 08/29/2012  . Tonsillectomy    . Artery aneurysm clipping Right     FAMILY HISTORY: Family History  Problem Relation Age of Onset  . Alzheimer's disease Mother   . Leukemia Father     Died in his early 85s    SOCIAL HISTORY: History   Social History  . Marital Status: Unknown    Spouse Name: N/A  . Number of Children: 0  . Years of Education: college   Occupational History  .      Retired   Social History Main Topics    . Smoking status: Former Games developer  . Smokeless tobacco: Never Used     Comment: Quit [id number]  . Alcohol Use: No  . Drug Use: No  . Sexual Activity: Not on file   Other Topics Concern  . Not on file   Social History Narrative   ** Merged History Encounter **       The patient is married and lives with her husband.    Patient has her masters   Patient has no children.    Patient is retired.      PHYSICAL EXAM  Filed Vitals:   03/29/15 0826  BP: 104/64  Pulse: 62  Height:  (1.6 m)  Weight: 120 lb (54.432 kg)   Body mass index is 21.26 kg/(m^2).  Generalized: Well developed, in no acute distress   Neurological examination  Mentation: Alert oriented to time, place, history taking. Follows all commands speech and language fluent. MMSE 30/30. Patient rhymes as she talks.   Cranial nerve II-XII: Pupils were equal round reactive to light. Extraocular movements were full, visual field were full on confrontational test. Facial sensation and strength were normal. Uvula tongue midline. Head turning and shoulder shrug  were normal and symmetric. Motor: The motor testing reveals 5 over 5 strength of all 4 extremities. Good symmetric motor tone is noted throughout.  Sensory: Sensory testing is intact to soft touch on all 4 extremities. No evidence of extinction is noted.  Coordination: Cerebellar testing reveals good finger-nose-finger and heel-to-shin bilaterally.  Gait and station: Gait is normal. Tandem gait is normal. Romberg is negative. No drift is seen.  Reflexes: Deep tendon reflexes are symmetric and normal bilaterally.    DIAGNOSTIC DATA (LABS, IMAGING, TESTING) - I reviewed patient records, labs, notes, testing and imaging myself where available.      ASSESSMENT AND PLAN 74 y.o. year old female  has a past medical history of Dementia; H/O cerebral aneurysm repair; Seizures (08/29/2012); Altered mental state (08/29/2012); Seizure; and Dementia. here with:  1.  Alzheimer's disease 2. Seizures   Overall the patient is doing well. Her memory has remained stable. Her MMSE is 30/30 today. She will continue taking Namenda. Patient denies any seizure events. She will continue taking Keppra. Her psychiatrist checked blood work for Depakote and carbamazepine. The patient and her husband showed me pictures of their home. It is very cluttered with no path to get through rooms. I have encouraged the patient and her husband to organize their home for safety and hygienic purposes. They verbalized understanding. If the patient's symptoms worsen or she develops new symptoms she should let us know. Otherwise she will follow-up in 6 months or sooner if needed.  Butch Penny, MSN, NP-C 03/29/2015, 8:41 AM [facility] [address] [place], [place] [address] [phone]  Note: This document was prepared with digital dictation and  possible smart Company secretary. Any transcriptional errors that result from this process are unintentional.

## 2015-03-29 NOTE — Patient Instructions (Signed)
Continue Namenda Continue Keppra If your symptoms worsen please let us know.

## 2015-08-16 ENCOUNTER — Telehealth: Payer: Self-pay

## 2015-08-16 NOTE — Telephone Encounter (Signed)
Spoke to spouse. Scheduled patient for an earlier visit due to NP on vacation for that week.

## 2015-09-01 ENCOUNTER — Other Ambulatory Visit: Payer: Self-pay | Admitting: Neurology

## 2015-09-15 ENCOUNTER — Encounter: Payer: Self-pay | Admitting: Adult Health

## 2015-09-15 ENCOUNTER — Ambulatory Visit (INDEPENDENT_AMBULATORY_CARE_PROVIDER_SITE_OTHER): Payer: Medicare Other | Admitting: Adult Health

## 2015-09-15 VITALS — BP 107/61 | HR 77 | Ht 62.0 in | Wt 130.0 lb

## 2015-09-15 DIAGNOSIS — F0391 Unspecified dementia with behavioral disturbance: Secondary | ICD-10-CM

## 2015-09-15 DIAGNOSIS — R569 Unspecified convulsions: Secondary | ICD-10-CM | POA: Diagnosis not present

## 2015-09-15 DIAGNOSIS — F03918 Unspecified dementia, unspecified severity, with other behavioral disturbance: Secondary | ICD-10-CM

## 2015-09-15 NOTE — Progress Notes (Signed)
PATIENT: Jaime Barry DOB: August 12, 1942REASON FOR VISIT: follow up- memory, seizures HISTORY FROM: patient  HISTORY OF PRESENT ILLNESS: Jaime Barry is a 74 year old female with a history of progressive dementing illness and seizures. She returns today for follow-up. She continues to take Namenda and tolerates it well. She is able to complete all ADLs independently. She does not operate a motor vehicle. She continues to take Keppra for seizure. She denies any seizure events. She states that she never cooks however this was prior to any falls. Her husband completes the finances as he is a IT trainer. Husband states that she did not do as well on the memory test however he contributes this to her being "lazy" and not trying. The patient continues to see her psychiatrist. She currently takes Depakote to control her mood and behavior. She denies any new neurological symptoms. She returns today for an evaluation.  HISTORY 03/29/15: Jaime Barry is a 74 year old female with a history of a progressive dementing illness and seizures. The patient returns today for follow-up. The patient is currently taking Namenda and tolerating it well. She states her memory has remained the same. She no longer operates a motor vehicle. She is able to complete all ADLs independently. Her husband states that there are some things that she can do but refuses. For example she can make her own coffee however she waits for her husband to do it for her. She is also taking Keppra for seizures. She denies any seizure events. Her psychiatrist has placed her on Depakote and carbamazepine for behavior issues. Husband feels that the medication has helped. She states that he checks her blood work regularly. Patient states that she has had several falls since the last visit. She states that she fell in her house twice due to tripping over objects. States that she is a Engineer, maintenance (IT)." Her husband states that they don't even have a path to get through the house.  Apparently they have someone that is helping them clean and organize their home. She returns today for follow-up.  REVIEW OF SYSTEMS: Out of a complete 14 system review of symptoms, the patient complains only of the following symptoms, and all other reviewed systems are negative.  Incontinence of bowels, leg swelling  ALLERGIES: No Known Allergies  HOME MEDICATIONS: Outpatient Prescriptions Prior to Visit  Medication Sig Dispense Refill  . divalproex (DEPAKOTE ER) 250 MG 24 hr tablet Take 250 mg by mouth. Three tabs in morning    . divalproex (DEPAKOTE ER) 500 MG 24 hr tablet     . EQUETRO 200 MG CP12 12 hr capsule     . levETIRAcetam (KEPPRA) 500 MG tablet TAKE ONE TABLET BY MOUTH TWICE DAILY 60 tablet 0  . meloxicam (MOBIC) 15 MG tablet     . NAMENDA XR 28 MG CP24 24 hr capsule TAKE ONE CAPSULE BY MOUTH ONCE DAILY 30 capsule 0  . ZINC PICOLINATE PO Take 50 mg by mouth daily.     No facility-administered medications prior to visit.    PAST MEDICAL HISTORY: Past Medical History  Diagnosis Date  . Dementia   . H/O cerebral aneurysm repair     in December of 2011  . Seizures 08/29/2012  . Altered mental state 08/29/2012  . Seizure   . Dementia     progressive     PAST SURGICAL HISTORY: Past Surgical History  Procedure Laterality Date  . Lumbar puncture  08/29/2012  . Cerebral aneurysm repair  09/2010    paraophthalmic  artery /note 08/29/2012  . Tonsillectomy    . Artery aneurysm clipping Right     FAMILY HISTORY: Family History  Problem Relation Age of Onset  . Alzheimer's disease Mother   . Leukemia Father     Died in his early 6750s    SOCIAL HISTORY: Social History   Social History  . Marital Status: Unknown    Spouse Name: N/A  . Number of Children: 0  . Years of Education: college   Occupational History  .      Retired   Social History Main Topics  . Smoking status: Former Games developermoker  . Smokeless tobacco: Never Used     Comment: Quit [id number]  . Alcohol  Use: No  . Drug Use: No  . Sexual Activity: Not on file   Other Topics Concern  . Not on file   Social History Narrative   ** Merged History Encounter **       The patient is married and lives with her husband.    Patient has her masters   Patient has no children.    Patient is retired.      PHYSICAL EXAM  Filed Vitals:   09/15/15 0751  BP: 107/61  Pulse: 77  Height: 5\' 2"  (1.575 m)  Weight: 130 lb (58.968 kg)   Body mass index is 23.77 kg/(m^2).   MMSE - Mini Mental State Exam 09/15/2015 03/29/2015 08/26/2014  Orientation to time 4 5 5   Orientation to Place 5 5 5   Registration 3 3 3   Attention/ Calculation 2 5 2   Recall 2 3 2   Language- name 2 objects 2 2 2   Language- repeat 1 1 1   Language- follow 3 step command 3 3 3   Language- read & follow direction 1 1 1   Write a sentence 1 1 1   Copy design 1 1 1   Total score 25 30 26      Generalized: Well developed, in no acute distress   Neurological examination  Mentation: Alert oriented to time, place, history taking. Follows all commands speech and language fluent Cranial nerve II-XII: Pupils were equal round reactive to light. Extraocular movements were full, visual field were full on confrontational test. Facial sensation and strength were normal. Uvula tongue midline. Head turning and shoulder shrug  were normal and symmetric. Motor: The motor testing reveals 5 over 5 strength of all 4 extremities. Good symmetric motor tone is noted throughout.  Sensory: Sensory testing is intact to soft touch on all 4 extremities. No evidence of extinction is noted.  Coordination: Cerebellar testing reveals good finger-nose-finger and heel-to-shin bilaterally.  Gait and station: Gait is normal. Tandem gait is slightly unsteady. Romberg is negative. No drift is seen.  Reflexes: Deep tendon reflexes are symmetric and normal bilaterally.   DIAGNOSTIC DATA (LABS, IMAGING, TESTING) - I reviewed patient records, labs, notes, testing  and imaging myself where available.      ASSESSMENT AND PLAN 74 y.o. year old female  has a past medical history of Dementia; H/O cerebral aneurysm repair; Seizures (08/29/2012); Altered mental state (08/29/2012); Seizure; and Dementia. here with;  1. Dementia 2. Seizures  The patient's memory score is slightly decreased. MMSE today is 25/30 was previously 30/30. She will continue on Namenda. We will continue to monitor her memory.The patient has not had any additional seizure events. She will continue on Keppra. Patient advised that if her symptoms worsen or she develops any new symptoms she should let us know. She will follow-up in 6 months or  sooner if needed.     Aleiya Rye MilliButch PennyP-C 09/15/2015, 7:50 AM [facility] [address], [address] [place], [place] [address] [phone]

## 2015-09-15 NOTE — Progress Notes (Signed)
I have read the note, and I agree with the clinical assessment and plan.  Reynard Christoffersen KEITH   

## 2015-09-15 NOTE — Patient Instructions (Addendum)
Continue Namenda. Memory score is slightly decreased we will continue to monitor.  Continue Keppra. If your symptoms worsen or you develop new symptoms please let us know.

## 2015-09-27 ENCOUNTER — Other Ambulatory Visit: Payer: Self-pay | Admitting: Neurology

## 2015-09-28 ENCOUNTER — Ambulatory Visit: Payer: Medicare Other | Admitting: Adult Health

## 2015-10-14 ENCOUNTER — Telehealth: Payer: Self-pay

## 2015-10-14 NOTE — Telephone Encounter (Signed)
Called pt to reschedule apt due to Dr. Anne HahnWillis on vacation and Mrs. Mistry's husband Gery PrayBarry was very rude and agitated that I was even calling to inconvienced him to reschedule the apt.  He stated this is not the first time we have had to reschedule an apt and it's very disrupting!  This apt that I was rescheduling was for June 2017 we are in January 2017 now, 5 months from June!  Please be advised I was very apologenic to him and ended the phone call hoping he had a great rest of his day.Jaime Barry.Jaime Barry.He also does not want a remainder call for apts that bothers him too!

## 2016-03-07 ENCOUNTER — Ambulatory Visit: Payer: Medicare Other | Admitting: Neurology

## 2016-03-09 ENCOUNTER — Encounter: Payer: Self-pay | Admitting: Neurology

## 2016-03-09 ENCOUNTER — Ambulatory Visit (INDEPENDENT_AMBULATORY_CARE_PROVIDER_SITE_OTHER): Payer: Medicare Other | Admitting: Neurology

## 2016-03-09 VITALS — BP 102/58 | HR 78 | Resp 16 | Ht 62.0 in | Wt 123.0 lb

## 2016-03-09 DIAGNOSIS — F0280 Dementia in other diseases classified elsewhere without behavioral disturbance: Secondary | ICD-10-CM

## 2016-03-09 DIAGNOSIS — R569 Unspecified convulsions: Secondary | ICD-10-CM

## 2016-03-09 DIAGNOSIS — G301 Alzheimer's disease with late onset: Secondary | ICD-10-CM | POA: Diagnosis not present

## 2016-03-09 MED ORDER — LEVETIRACETAM 500 MG PO TABS: 500.0000 mg | ORAL_TABLET | Freq: Two times a day (BID) | ORAL | Status: DC

## 2016-03-09 MED ORDER — MEMANTINE HCL ER 28 MG PO CP24: 28.0000 mg | ORAL_CAPSULE | Freq: Every day | ORAL | Status: DC

## 2016-03-09 NOTE — Patient Instructions (Signed)
Fall Prevention in the Home  Falls can cause injuries and can affect people from all age groups. There are many simple things that you can do to make your home safe and to help prevent falls. WHAT CAN I DO ON THE OUTSIDE OF MY HOME?  Regularly repair the edges of walkways and driveways and fix any cracks.  Remove high doorway thresholds.  Trim any shrubbery on the main path into your home.  Use bright outdoor lighting.  Clear walkways of debris and clutter, including tools and rocks.  Regularly check that handrails are securely fastened and in good repair. Both sides of any steps should have handrails.  Install guardrails along the edges of any raised decks or porches.  Have leaves, snow, and ice cleared regularly.  Use sand or salt on walkways during winter months.  In the garage, clean up any spills right away, including grease or oil spills. WHAT CAN I DO IN THE BATHROOM?  Use night lights.  Install grab bars by the toilet and in the tub and shower. Do not use towel bars as grab bars.  Use non-skid mats or decals on the floor of the tub or shower.  If you need to sit down while you are in the shower, use a plastic, non-slip stool..  Keep the floor dry. Immediately clean up any water that spills on the floor.  Remove soap buildup in the tub or shower on a regular basis.  Attach bath mats securely with double-sided non-slip rug tape.  Remove throw rugs and other tripping hazards from the floor. WHAT CAN I DO IN THE BEDROOM?  Use night lights.  Make sure that a bedside light is easy to reach.  Do not use oversized bedding that drapes onto the floor.  Have a firm chair that has side arms to use for getting dressed.  Remove throw rugs and other tripping hazards from the floor. WHAT CAN I DO IN THE KITCHEN?   Clean up any spills right away.  Avoid walking on wet floors.  Place frequently used items in easy-to-reach places.  If you need to reach for something  above you, use a sturdy step stool that has a grab bar.  Keep electrical cables out of the way.  Do not use floor polish or wax that makes floors slippery. If you have to use wax, make sure that it is non-skid floor wax.  Remove throw rugs and other tripping hazards from the floor. WHAT CAN I DO IN THE STAIRWAYS?  Do not leave any items on the stairs.  Make sure that there are handrails on both sides of the stairs. Fix handrails that are broken or loose. Make sure that handrails are as long as the stairways.  Check any carpeting to make sure that it is firmly attached to the stairs. Fix any carpet that is loose or worn.  Avoid having throw rugs at the top or bottom of stairways, or secure the rugs with carpet tape to prevent them from moving.  Make sure that you have a light switch at the top of the stairs and the bottom of the stairs. If you do not have them, have them installed. WHAT ARE SOME OTHER FALL PREVENTION TIPS?  Wear closed-toe shoes that fit well and support your feet. Wear shoes that have rubber soles or low heels.  When you use a stepladder, make sure that it is completely opened and that the sides are firmly locked. Have someone hold the ladder while you   are using it. Do not climb a closed stepladder.  Add color or contrast paint or tape to grab bars and handrails in your home. Place contrasting color strips on the first and last steps.  Use mobility aids as needed, such as canes, walkers, scooters, and crutches.  Turn on lights if it is dark. Replace any light bulbs that burn out.  Set up furniture so that there are clear paths. Keep the furniture in the same spot.  Fix any uneven floor surfaces.  Choose a carpet design that does not hide the edge of steps of a stairway.  Be aware of any and all pets.  Review your medicines with your healthcare provider. Some medicines can cause dizziness or changes in blood pressure, which increase your risk of falling. Talk  with your health care provider about other ways that you can decrease your risk of falls. This may include working with a physical therapist or trainer to improve your strength, balance, and endurance.   This information is not intended to replace advice given to you by your health care provider. Make sure you discuss any questions you have with your health care provider.   Document Released: 09/08/2002 Document Revised: 02/02/2015 Document Reviewed: 10/23/2014 Elsevier Interactive Patient Education 2016 [employer]vier Inc.  

## 2016-03-09 NOTE — Progress Notes (Signed)
Reason for visit: Memory disturbance  Jaime Barry is an 75 y.o. female  History of present illness:  Jaime Barry is a 75 year old right-handed white female with a history of a progressive memory disturbance and a history of seizures. The patient is on Keppra which she is tolerating fairly well. She has not had any further seizures since last seen. The patient does not operate a motor vehicle. The patient is followed by Dr. Donell BeersPlovsky, and Depakote and Zoloft have been added to her regimen which seems to help her behavior. The patient has had 2 falls since last seen, one fall was 3 months ago when she tried to walk downtown and fell when she was crossing the street. She did not sustain injury. She fell again 2 weeks ago when she was out in the yard. The patient does not use a cane or a walker for ambulation. She has had improvement in her behavior with the treatment through her psychiatrist. The patient returns to the office today for an evaluation. No other new medical issues have come up since last seen.  Past Medical History  Diagnosis Date  . Dementia   . H/O cerebral aneurysm repair     in December of 2011  . Seizures (HCC) 08/29/2012  . Altered mental state 08/29/2012  . Seizure (HCC)   . Dementia     progressive   . Osteoporosis   . Spinal stenosis   . Lower leg edema     Past Surgical History  Procedure Laterality Date  . Lumbar puncture  08/29/2012  . Cerebral aneurysm repair  09/2010    paraophthalmic artery /note 08/29/2012  . Tonsillectomy    . Artery aneurysm clipping Right     Family History  Problem Relation Age of Onset  . Alzheimer's disease Mother   . Leukemia Father     Died in his early 6050s    Social history:  reports that she has quit smoking. She has never used smokeless tobacco. She reports that she does not drink alcohol or use illicit drugs.   No Known Allergies  Medications:  Prior to Admission medications   Medication Sig Start Date End Date  Taking? Authorizing Provider  divalproex (DEPAKOTE ER) 500 MG 24 hr tablet Take 500 mg by mouth 2 (two) times daily.  03/22/15  Yes Historical Provider, MD  levETIRAcetam (KEPPRA) 500 MG tablet Take 1 tablet (500 mg total) by mouth 2 (two) times daily. 03/09/16  Yes York Spanielharles K Willis, MD  meloxicam [facility](MOBIC) 15 MG tablet  03/09/15  Yes Historical Provider, MD  memantine (NAMENDA XR) 28 MG CP24 24 hr capsule Take 1 capsule (28 mg total) by mouth daily. 03/09/16  Yes York Spanielharles K Willis, MD  NON FORMULARY Tumeric   Yes Historical Provider, MD  sertraline (ZOLOFT) 100 MG tablet  09/09/15  Yes Historical Provider, MD  ZINC PICOLINATE PO Take 50 mg by mouth daily.   Yes Historical Provider, MD    ROS:  Out of a complete 14 system review of symptoms, the patient complains only of the following symptoms, and all other reviewed systems are negative.  Incontinence of bowel and bladder Back pain Memory loss  Blood pressure 102/58, pulse 78, resp. rate 16, height 5\' 2"  (1.575 m), weight 123 lb (55.792 kg).  Physical Exam  General: The patient is alert and cooperative at the time of the examination.  Skin: No significant peripheral edema is noted.   Neurologic Exam  Mental status: The patient is  alert and oriented x 3 at the time of the examination. The Mini-Mental Status Examination done today shows a total score 22/30.   Cranial nerves: Facial symmetry is present. Speech is normal, no aphasia or dysarthria is noted. Extraocular movements are full. Visual fields are full.  Motor: The patient has good strength in all 4 extremities.  Sensory examination: Soft touch sensation is symmetric on the face, arms, and legs.  Coordination: The patient has good finger-nose-finger and heel-to-shin bilaterally.  Gait and station: The patient has a normal gait. Tandem gait is unsteady. Romberg is negative. No drift is seen.  Reflexes: Deep tendon reflexes are symmetric.   Assessment/Plan:  1. Progressive  memory disorder  2. Seizures, well controlled  3. Mild gait disorder  The patient has had 2 falls since last seen, fortunately without injury. The patient is doing well with her seizures. The memory issue continues to decline. The patient remains on Namenda, a prescription was given for this medication as well as for Keppra. The addition of Depakote and Zoloft seems to have helped her behavior issue some. She will follow-up in 6 or 7 months.  Marlan Palau MD 03/09/2016 5:11 PM  [facility] [address] [place], [place] [address]  Phone [phone] Fax [phone]

## 2016-03-15 ENCOUNTER — Ambulatory Visit: Payer: Medicare Other | Admitting: Neurology

## 2016-11-08 ENCOUNTER — Telehealth: Payer: Self-pay

## 2016-11-08 NOTE — Telephone Encounter (Signed)
Pt's husband called back advised there was a msg left on their machine that the appt for tomorrow was needing to be r/s. I got the patient's information (I got a heavy sigh on the phone from him) and told him Aundra MilletMegan was sick and that was why it had to r/s. He became very stern, raised his voice and said " and that is the only reason why I am not sending you a bill for $135.00! Is that not what your company does to cancel or r/s within 24 hours of the appointment?!" I said sir, this is not directed toward me, this is office policy. He said "you are right, it is not directed toward you but I will be asking Dr Anne HahnWillis for a referral to another neurologist. I want Dr Anne HahnWillis to know". He then hung up.

## 2016-11-08 NOTE — Telephone Encounter (Signed)
I called and left a message. If the patient can be in the office before 4:30 today, I will be happy to see her.

## 2016-11-08 NOTE — Telephone Encounter (Signed)
I called pt to r/s pt's appt for 11/09/16 at 7:30am because Aundra MilletMegan, NP is out of the office sick. No answer. I left a message on her home number [phone], per DPR, advising her that her appt tomorrow with Aundra MilletMegan, NP will need to be rescheduled, and to call us back to reschedule.

## 2016-11-08 NOTE — Telephone Encounter (Addendum)
Dr Anne HahnWillis- How would you like to handle this? I was going to schedule with you but you are booked out until March, even with work in slots.   Did you want to double book your 430pm today? Marcelino DusterMichelle or I could stay until you are finished. OR Dr Marjory LiesPenumalli has an opening tomorrow at 2pm if you are ok and Dr Marjory LiesPenumalli is ok with seeing pt?

## 2016-11-09 ENCOUNTER — Ambulatory Visit: Payer: Medicare Other | Admitting: Adult Health

## 2016-11-10 NOTE — Telephone Encounter (Signed)
Dr Anne HahnWillis- this patient/husand did not call back, fyi

## 2017-02-21 ENCOUNTER — Other Ambulatory Visit: Payer: Self-pay | Admitting: Neurology

## 2017-03-05 ENCOUNTER — Encounter: Payer: Self-pay | Admitting: Adult Health

## 2017-03-05 ENCOUNTER — Ambulatory Visit (INDEPENDENT_AMBULATORY_CARE_PROVIDER_SITE_OTHER): Payer: Medicare Other | Admitting: Adult Health

## 2017-03-05 VITALS — BP 109/63 | HR 91 | Ht 62.0 in | Wt 131.0 lb

## 2017-03-05 DIAGNOSIS — R413 Other amnesia: Secondary | ICD-10-CM

## 2017-03-05 DIAGNOSIS — R569 Unspecified convulsions: Secondary | ICD-10-CM | POA: Diagnosis not present

## 2017-03-05 NOTE — Patient Instructions (Signed)
Continue Keppra and Namenda Memory score improved slightly If your symptoms worsen or you develop new symptoms please let us know.

## 2017-03-05 NOTE — Progress Notes (Signed)
PATIENT: Jaime Barry DOB: [birth year]  REASON FOR VISIT: follow up- memory disturbance, seizures HISTORY FROM: patient  HISTORY OF PRESENT ILLNESS:  Today 03/05/17: Jaime Barry is a 76 year old female with a history of progressive memory disturbance and seizures. She returns today for follow-up. She reports that she is not had any additional seizure events. She remains on Keppra and is tolerating this well. She lives at home with her husband. She is able to complete all ADLs independently. She does not operate a motor vehicle. Her husband handles finances. They typically do not prepare meals at home but rather eat out. Husband reports that she does have some difficulty with her gait. She has not had any recent falls. She uses a cane when she is ambulating. She returns today for an evaluation.    HISTORY 03/09/16: Jaime Barry is a 76 year old right-handed white female with a history of a progressive memory disturbance and a history of seizures. The patient is on Keppra which she is tolerating fairly well. She has not had any further seizures since last seen. The patient does not operate a motor vehicle. The patient is followed by Dr. Donell Beers, and Depakote and Zoloft have been added to her regimen which seems to help her behavior. The patient has had 2 falls since last seen, one fall was 3 months ago when she tried to walk downtown and fell when she was crossing the street. She did not sustain injury. She fell again 2 weeks ago when she was out in the yard. The patient does not use a cane or a walker for ambulation. She has had improvement in her behavior with the treatment through her psychiatrist. The patient returns to the office today for an evaluation. No other new medical issues have come up since last seen.  REVIEW OF SYSTEMS: Out of a complete 14 system review of symptoms, the patient complains only of the following symptoms, and all other reviewed systems are negative.  Back pain, walking  difficulty, comments or bladder, memory loss, incontinence of bowels  ALLERGIES: No Known Allergies  HOME MEDICATIONS: Outpatient Medications Prior to Visit  Medication Sig Dispense Refill  . divalproex (DEPAKOTE ER) 500 MG 24 hr tablet Take 500 mg by mouth 2 (two) times daily.     Marland Kitchen levETIRAcetam (KEPPRA) 500 MG tablet TAKE ONE TABLET BY MOUTH TWICE DAILY 60 tablet 0  . meloxicam (MOBIC) 15 MG tablet     . memantine (NAMENDA XR) 28 MG CP24 24 hr capsule TAKE ONE CAPSULE BY MOUTH ONCE DAILY 30 capsule 0  . NON FORMULARY Tumeric    . sertraline (ZOLOFT) 100 MG tablet     . ZINC PICOLINATE PO Take 50 mg by mouth daily.     No facility-administered medications prior to visit.     PAST MEDICAL HISTORY: Past Medical History:  Diagnosis Date  . Altered mental state 08/29/2012  . Dementia   . Dementia    progressive   . H/O cerebral aneurysm repair    in December of 2011  . Lower leg edema   . Osteoporosis   . Seizure (HCC)   . Seizures (HCC) 08/29/2012  . Spinal stenosis     PAST SURGICAL HISTORY: Past Surgical History:  Procedure Laterality Date  . artery aneurysm clipping Right   . CEREBRAL ANEURYSM REPAIR  09/2010   paraophthalmic artery /note 08/29/2012  . LUMBAR PUNCTURE  08/29/2012  . TONSILLECTOMY      FAMILY HISTORY: Family History  Problem Relation  Age of Onset  . Alzheimer's disease Mother   . Leukemia Father        Died in his early 6950s    SOCIAL HISTORY: Social History   Social History  . Marital status: Unknown    Spouse name: N/A  . Number of children: 0  . Years of education: college   Occupational History  .      Retired   Social History Main Topics  . Smoking status: Former Games developermoker  . Smokeless tobacco: Never Used     Comment: Quit [id number]  . Alcohol use No  . Drug use: No  . Sexual activity: Not on file   Other Topics Concern  . Not on file   Social History Narrative   ** Merged History Encounter **       The patient is married  and lives with her husband.    Patient has her masters   Patient has no children.    Patient is retired.      PHYSICAL EXAM  Vitals:   03/05/17 0735  BP: 109/63  Pulse: 91  Weight: 131 lb (59.4 kg)  Height: 5\' 2"  (1.575 m)   Body mass index is 23.96 kg/m.  MMSE - Mini Mental State Exam 03/05/2017 03/09/2016 09/15/2015  Orientation to time 4 5 4   Orientation to Place 4 4 5   Registration 3 3 3   Attention/ Calculation 5 0 2  Recall 2 1 2   Language- name 2 objects 2 2 2   Language- repeat 1 1 1   Language- follow 3 step command 3 3 3   Language- read & follow direction 1 1 1   Write a sentence 1 1 1   Copy design 1 1 1   Total score 27 22 25       Generalized: Well developed, in no acute distress   Neurological examination  Mentation: Alert oriented to time, place, history taking. Follows all commands speech and language fluent Cranial nerve II-XII: Pupils were equal round reactive to light. Extraocular movements were full, visual field were full on confrontational test. Facial sensation and strength were normal. Uvula tongue midline. Head turning and shoulder shrug  were normal and symmetric. Motor: The motor testing reveals 5 over 5 strength of all 4 extremities. Good symmetric motor tone is noted throughout.  Sensory: Sensory testing is intact to soft touch on all 4 extremities. No evidence of extinction is noted.  Coordination: Cerebellar testing reveals good finger-nose-finger and heel-to-shin bilaterally.  Gait and station: Gait is slightly unsteady. Tandem gait not attempted. She uses a cane when ambulating. Reflexes: Deep tendon reflexes are symmetric and normal bilaterally.   DIAGNOSTIC DATA (LABS, IMAGING, TESTING) - I reviewed patient records, labs, notes, testing and imaging myself where available.  Lab Results  Component Value Date   WBC 4.7 09/01/2012   HGB 11.7 (L) 09/01/2012   HCT 35.6 (L) 09/01/2012   MCV 99.4 09/01/2012   PLT 169 09/01/2012      Component  Value Date/Time   NA 142 09/01/2012 0612   K 3.5 09/01/2012 0612   CL 109 09/01/2012 0612   CO2 27 09/01/2012 0612   GLUCOSE 87 09/01/2012 0612   BUN 7 09/01/2012 0612   CREATININE 0.63 09/01/2012 0612   CALCIUM 8.5 09/01/2012 0612   PROT 6.4 08/29/2012 0534   ALBUMIN 3.7 08/29/2012 0534   AST 18 08/29/2012 0534   ALT 14 08/29/2012 0534   ALKPHOS 48 08/29/2012 0534   BILITOT 0.3 08/29/2012 0534   GFRNONAA 89 (L)  09/01/2012 0612   GFRAA >90 09/01/2012 0612    Lab Results  Component Value Date   HGBA1C 5.9 (H) 08/29/2012   No results found for: [id number] Lab Results  Component Value Date   TSH 1.215 08/29/2012      ASSESSMENT AND PLAN 76 y.o. year old female  has a past medical history of Altered mental state (08/29/2012); Dementia; Dementia; H/O cerebral aneurysm repair; Lower leg edema; Osteoporosis; Seizure (HCC); Seizures (HCC) (08/29/2012); and Spinal stenosis. here with:  1. Seizures 2. Memory disturbance  The patient's memory score has improved since the last visit. She will continue on Namenda. She has not had any additional seizure event she will continue on Keppra. She will continue follow-up with Dr. Donell Beers for behavior. She is currently on Depakote and Zoloft. Patient advised that if her symptoms worsen or she develops new symptoms she she'll let us know. She will return in 6 months or sooner if needed.     Butch Penny, MSN, NP-C 03/05/2017, 7:26 AM [facility] [address] [place], [place] [address] [phone]

## 2017-03-05 NOTE — Progress Notes (Signed)
I have read the note, and I agree with the clinical assessment and plan.  WILLIS,CHARLES KEITH   

## 2017-04-20 ENCOUNTER — Other Ambulatory Visit: Payer: Self-pay | Admitting: Neurology

## 2017-04-24 ENCOUNTER — Other Ambulatory Visit: Payer: Self-pay

## 2017-04-24 MED ORDER — LEVETIRACETAM 500 MG PO TABS: 500.0000 mg | ORAL_TABLET | Freq: Two times a day (BID) | ORAL | 6 refills | Status: DC

## 2017-05-07 ENCOUNTER — Other Ambulatory Visit: Payer: Self-pay | Admitting: Neurology

## 2017-09-04 ENCOUNTER — Other Ambulatory Visit: Payer: Self-pay

## 2017-09-04 ENCOUNTER — Encounter: Payer: Self-pay | Admitting: Adult Health

## 2017-09-04 ENCOUNTER — Ambulatory Visit: Payer: Medicare Other | Admitting: Adult Health

## 2017-09-04 VITALS — BP 90/55 | HR 88 | Resp 18 | Ht 62.0 in | Wt 134.0 lb

## 2017-09-04 DIAGNOSIS — R569 Unspecified convulsions: Secondary | ICD-10-CM | POA: Diagnosis not present

## 2017-09-04 DIAGNOSIS — R2689 Other abnormalities of gait and mobility: Secondary | ICD-10-CM | POA: Diagnosis not present

## 2017-09-04 DIAGNOSIS — R413 Other amnesia: Secondary | ICD-10-CM

## 2017-09-04 MED ORDER — MEMANTINE HCL ER 28 MG PO CP24: 28.0000 mg | ORAL_CAPSULE | Freq: Every day | ORAL | 3 refills | Status: DC

## 2017-09-04 MED ORDER — LEVETIRACETAM 500 MG PO TABS: 500.0000 mg | ORAL_TABLET | Freq: Two times a day (BID) | ORAL | 3 refills | Status: DC

## 2017-09-04 NOTE — Patient Instructions (Addendum)
Your Plan:  Continue Keppra for seizures Memory score is stable- Continue Namenda XR 28 mg daily Referral to PT If your symptoms worsen or you develop new symptoms please let us know.   Thank you for coming to see us at [facility]. I hope we have been able to provide you high quality care today. You may receive a patient satisfaction survey over the next few weeks. We would appreciate your feedback and comments so that we may continue to improve ourselves and the health of our patients.

## 2017-09-04 NOTE — Progress Notes (Signed)
PATIENT: Jaime Barry DOB: 07/31/1941  REASON FOR VISIT: follow up-seizures, memory disturbance HISTORY FROM: patient  HISTORY OF PRESENT ILLNESS: Today 09/04/17 Jaime Barry is a 76 year old female with a history of progressive memory disturbance and seizures.  She returns today for follow-up.  She continues on Keppra 500 mg twice a day.  She denies any recent seizure events.  She lives at home with her husband.  Denies any trouble sleeping.  Denies hallucinations.  She is able to complete all ADLs independently.  She no longer operates a motor vehicle.  She reports that she does have issues controlling her bladder.  They report that they have discussed this with her primary care and Dr. Donell Beersplovsky but they mutually decided that he did not want to try medications due to potential side effects.  The husband reports that after the beginning of the year they are considering trying acupuncture.  Her husband reports that she does have trouble with her balance.  He reports that she had a fall a couple weeks ago.  Fortunately she did not sustain any injuries.  She uses a cane when ambulating.  He reports that she continues to do the exercises that she learned from physical therapy several years ago.  She returns today for an evaluation.  HISTORY 03/05/17: Jaime Barry is a 76 year old female with a history of progressive memory disturbance and seizures. She returns today for follow-up. She reports that she is not had any additional seizure events. She remains on Keppra and is tolerating this well. She lives at home with her husband. She is able to complete all ADLs independently. She does not operate a motor vehicle. Her husband handles finances. They typically do not prepare meals at home but rather eat out. Husband reports that she does have some difficulty with her gait. She has not had any recent falls. She uses a cane when she is ambulating. She returns today for an evaluation.   REVIEW OF SYSTEMS: Out of  a complete 14 system review of symptoms, the patient complains only of the following symptoms, and all other reviewed systems are negative.  Incontinence of bowels, insomnia, joint pain, back pain, walking difficulty, incontinence of bladder, memory loss  ALLERGIES: No Known Allergies  HOME MEDICATIONS: Outpatient Medications Prior to Visit  Medication Sig Dispense Refill  . Cholecalciferol (VITAMIN D3 PO) Take by mouth.    . divalproex (DEPAKOTE ER) 500 MG 24 hr tablet Take 500 mg by mouth 2 (two) times daily.     Marland Kitchen. levETIRAcetam (KEPPRA) 500 MG tablet Take 1 tablet (500 mg total) by mouth 2 (two) times daily. 60 tablet 6  . meloxicam (MOBIC) 15 MG tablet     . memantine (NAMENDA XR) 28 MG CP24 24 hr capsule TAKE ONE CAPSULE BY MOUTH ONCE DAILY 90 capsule 3  . NON FORMULARY Tumeric    . polycarbophil (FIBERCON) 625 MG tablet Take 625 mg by mouth daily.    . sertraline (ZOLOFT) 100 MG tablet     . TURMERIC PO Take by mouth.    Marland Kitchen. ZINC PICOLINATE PO Take 50 mg by mouth daily.     No facility-administered medications prior to visit.     PAST MEDICAL HISTORY: Past Medical History:  Diagnosis Date  . Altered mental state 08/29/2012  . Dementia   . Dementia    progressive   . H/O cerebral aneurysm repair    in December of 2011  . Lower leg edema   . Osteoporosis   .  Seizure (HCC)   . Seizures (HCC) 08/29/2012  . Spinal stenosis     PAST SURGICAL HISTORY: Past Surgical History:  Procedure Laterality Date  . artery aneurysm clipping Right   . CEREBRAL ANEURYSM REPAIR  09/2010   paraophthalmic artery /note 08/29/2012  . LUMBAR PUNCTURE  08/29/2012  . TONSILLECTOMY      FAMILY HISTORY: Family History  Problem Relation Age of Onset  . Alzheimer's disease Mother   . Leukemia Father        Died in his early 22s    SOCIAL HISTORY: Social History   Socioeconomic History  . Marital status: Unknown    Spouse name: Not on file  . Number of children: 0  . Years of  education: college  . Highest education level: Not on file  Social Needs  . Financial resource strain: Not on file  . Food insecurity - worry: Not on file  . Food insecurity - inability: Not on file  . Transportation needs - medical: Not on file  . Transportation needs - non-medical: Not on file  Occupational History    Comment: Retired  Tobacco Use  . Smoking status: Former Games developer  . Smokeless tobacco: Never Used  . Tobacco comment: Quit 1965  Substance and Sexual Activity  . Alcohol use: No    Alcohol/week: 0.6 oz    Types: 1 Shots of liquor per week  . Drug use: No  . Sexual activity: Not on file  Other Topics Concern  . Not on file  Social History Narrative   ** Merged History Encounter **       The patient is married and lives with her husband.    Patient has her masters   Patient has no children.    Patient is retired.      PHYSICAL EXAM  Vitals:   09/04/17 0723  BP: (!) 90/55  Pulse: 88  Resp: 18  Weight: 134 lb (60.8 kg)  Height: 5\' 2"  (1.575 m)   Body mass index is 24.51 kg/m.   MMSE - Mini Mental State Exam 03/05/2017 03/09/2016 09/15/2015  Orientation to time 4 5 4   Orientation to Place 4 4 5   Registration 3 3 3   Attention/ Calculation 5 0 2  Recall 2 1 2   Language- name 2 objects 2 2 2   Language- repeat 1 1 1   Language- follow 3 step command 3 3 3   Language- read & follow direction 1 1 1   Write a sentence 1 1 1   Copy design 1 1 1   Total score 27 22 25     Generalized: Well developed, in no acute distress   Neurological examination  Mentation: Alert oriented to time, place, history taking. Follows all commands speech and language fluent Cranial nerve II-XII: Pupils were equal round reactive to light. Extraocular movements were full, visual field were full on confrontational test. Facial sensation and strength were normal. Uvula tongue midline. Head turning and shoulder shrug  were normal and symmetric. Motor: The motor testing reveals 5 over 5  strength of all 4 extremities. Good symmetric motor tone is noted throughout.  Sensory: Sensory testing is intact to soft touch on all 4 extremities. No evidence of extinction is noted.  Coordination: Cerebellar testing reveals good finger-nose-finger and heel-to-shin bilaterally.  Gait and station: Patient uses a cane when ambulating.  Posture is slightly stooped.  Gait is slow and cautious..  Reflexes: Deep tendon reflexes are symmetric and normal bilaterally.   DIAGNOSTIC DATA (LABS, IMAGING, TESTING) - I  reviewed patient records, labs, notes, testing and imaging myself where available.  Lab Results  Component Value Date   WBC 4.7 09/01/2012   HGB 11.7 (L) 09/01/2012   HCT 35.6 (L) 09/01/2012   MCV 99.4 09/01/2012   PLT 169 09/01/2012      Component Value Date/Time   NA 142 09/01/2012 0612   K 3.5 09/01/2012 0612   CL 109 09/01/2012 0612   CO2 27 09/01/2012 0612   GLUCOSE 87 09/01/2012 0612   BUN 7 09/01/2012 0612   CREATININE 0.63 09/01/2012 0612   CALCIUM 8.5 09/01/2012 0612   PROT 6.4 08/29/2012 0534   ALBUMIN 3.7 08/29/2012 0534   AST 18 08/29/2012 0534   ALT 14 08/29/2012 0534   ALKPHOS 48 08/29/2012 0534   BILITOT 0.3 08/29/2012 0534   GFRNONAA 89 (L) 09/01/2012 0612   GFRAA >90 09/01/2012 0612    Lab Results  Component Value Date   HGBA1C 5.9 (H) 08/29/2012   No results found for: [id number] Lab Results  Component Value Date   TSH 1.215 08/29/2012      ASSESSMENT AND PLAN 76 y.o. year old female  has a past medical history of Altered mental state (08/29/2012), Dementia, Dementia, H/O cerebral aneurysm repair, Lower leg edema, Osteoporosis, Seizure (HCC), Seizures (HCC) (08/29/2012), and Spinal stenosis. here with:  1.  Seizures 2.  Memory disturbance 3.  Abnormality of gait and balance  Overall the patient's memory has remained stable.  She will continue on Namenda XR 28 mg daily.  The patient has not had any additional seizure events.  She will  continue on Keppra 500 mg twice a day.  The patient reports some trouble with her balance and falls.  I will refer to physical therapy.  I have advised the patient and her husband that if her symptoms worsen or she develops new symptoms she should let us know.  She will follow-up in 6 months or sooner if needed.    Butch PennyMegan Rance Smithson, MSN, NP-C 09/04/2017, 7:12 AM [facility] [address], [address] [place], [place] [address] [phone]

## 2017-09-04 NOTE — Progress Notes (Signed)
I have read the note, and I agree with the clinical assessment and plan.  Garrie Woodin K Aneliese Beaudry   

## 2017-09-17 ENCOUNTER — Ambulatory Visit: Payer: Medicare Other | Attending: Adult Health | Admitting: Physical Therapy

## 2017-09-17 ENCOUNTER — Encounter: Payer: Self-pay | Admitting: Physical Therapy

## 2017-09-17 DIAGNOSIS — M6281 Muscle weakness (generalized): Secondary | ICD-10-CM | POA: Insufficient documentation

## 2017-09-17 DIAGNOSIS — R2681 Unsteadiness on feet: Secondary | ICD-10-CM | POA: Insufficient documentation

## 2017-09-17 DIAGNOSIS — R2689 Other abnormalities of gait and mobility: Secondary | ICD-10-CM

## 2017-09-18 NOTE — Therapy (Signed)
[facility] [address] [place], [place], [address] Phone: [phone]   Fax:  [phone]  Physical Therapy Evaluation  Patient Details  Name: Jaime Barry MRN: [id number] Date of Birth: [birth year] Referring Provider: Butch Penny, NP   Encounter Date: 09/17/2017  PT End of Session - 09/18/17 0958    Visit Number  1    Number of Visits  9    Date for PT Re-Evaluation  11/01/17    Authorization Type  UHC Medicare - G code and progress note every 10th visit    Authorization Time Period  09-17-17 - 11-16-17    PT Start Time  1315    PT Stop Time  1401    PT Time Calculation (min)  46 min       Past Medical History:  Diagnosis Date  . Altered mental state 08/29/2012  . Dementia   . Dementia    progressive   . H/O cerebral aneurysm repair    in December of 2011  . Lower leg edema   . Osteoporosis   . Seizure (HCC)   . Seizures (HCC) 08/29/2012  . Spinal stenosis     Past Surgical History:  Procedure Laterality Date  . artery aneurysm clipping Right   . CEREBRAL ANEURYSM REPAIR  09/2010   paraophthalmic artery /note 08/29/2012  . LUMBAR PUNCTURE  08/29/2012  . TONSILLECTOMY      There were no vitals filed for this visit.   Subjective Assessment - 09/18/17 0944    Subjective  Pt accompanied to PT by her husband; he reports pt has had difficulty walking for approximately the past year - she is using a quad cane, has RW at home but does not use it: she feels she does not need it at this time    Patient is accompained by:  Family member    Patient Stated Goals  "to be able to function longer and improve balance"    Currently in Pain?  No/denies         [facility] PT Assessment - 09/18/17 0001      Assessment   Medical Diagnosis  Gait Abnormality due to balance impairment    Referring Provider  Butch Penny, NP    Onset Date/Surgical Date  -- progressive decline in balance for past year per husband      Prior Therapy  -- eval only at this facility (in 2013)       Precautions   Precautions  Fall      Restrictions   Weight Bearing Restrictions  No      Balance Screen   Has the patient fallen in the past 6 months  Yes    How many times?  2 most recent fall in Nov. 2018    Has the patient had a decrease in activity level because of a fear of falling?   No    Is the patient reluctant to leave their home because of a fear of falling?   No      Home Environment   Living Environment  Private residence    Type of Home  House    Home Access  Stairs to enter    Entrance Stairs-Number of Steps  1 garage into kitchen - 1 step to get into kitchen    Entrance Stairs-Rails  None    Home Layout  One level      Prior Function   Level of Independence  Independent with basic ADLs;Independent  with household mobility without device;Independent with community mobility with device;Needs assistance with homemaking      Transfers   Transfers  Sit to Stand      Ambulation/Gait   Ambulation/Gait  Yes    Ambulation/Gait Assistance  5: Supervision    Ambulation Distance (Feet)  100 Feet    Assistive device  Small based quad cane    Gait Pattern  Trunk flexed;Decreased step length - right;Decreased step length - left    Ambulation Surface  Level;Indoor    Gait velocity  17.16 secs = 1.91 ft/sec      Standardized Balance Assessment   Standardized Balance Assessment  Berg Balance Test;Timed Up and Go Test      Berg Balance Test   Sit to Stand  Able to stand without using hands and stabilize independently    Standing Unsupported  Able to stand safely 2 minutes    Sitting with Back Unsupported but Feet Supported on Floor or Stool  Able to sit safely and securely 2 minutes    Stand to Sit  Sits safely with minimal use of hands    Transfers  Able to transfer safely, minor use of hands    Standing Unsupported with Eyes Closed  Able to stand 10 seconds safely    Standing Ubsupported with Feet Together   Able to place feet together independently and stand 1 minute safely    From Standing, Reach Forward with Outstretched Arm  Can reach confidently >25 cm (10")    From Standing Position, Pick up Object from Floor  Able to pick up shoe safely and easily    From Standing Position, Turn to Look Behind Over each Shoulder  Looks behind one side only/other side shows less weight shift    Turn 360 Degrees  Able to turn 360 degrees safely but slowly    Standing Unsupported, Alternately Place Feet on Step/Stool  Able to complete >2 steps/needs minimal assist    Standing Unsupported, One Foot in Front  Able to plae foot ahead of the other independently and hold 30 seconds    Standing on One Leg  Tries to lift leg/unable to hold 3 seconds but remains standing independently    Total Score  46      Timed Up and Go Test   Normal TUG (seconds)  20.9 no device: 21.62 secs with cane       Strength is WFL's with LLE noted to be slightly weaker than RLE      Objective measurements completed on examination: See above findings.                   PT Long Term Goals - 09/18/17 1117      PT LONG TERM GOAL #1   Title  Increase Berg balance test score from 46/56 to >/= 50/56 to reduce fall risk.    Baseline  46/56 on 09-17-17    Time  4    Period  Weeks    Status  New    Target Date  11/01/17 due to pt not starting PT until early Jan. 2019 due to holidays      PT LONG TERM GOAL #2   Title  Improve TUG score from 20.90 secs without device to </= 16.0 secs without device to demo improved functional mobility.    Baseline  20.90 secs without device:  21.62 secs with cane    Time  4    Period  Weeks    Status  New    Target Date  11/01/17      PT LONG TERM GOAL #3   Title  Increase gait velocity from 1.91 ft/sec to >/= 2.4 ft/sec with cane for incr. gait efficiency.      Baseline  17.16 secs = 1.91 ft/sec on 09-17-17    Time  4    Period  Weeks    Status  New    Target Date  11/01/17       PT LONG TERM GOAL #4   Title  Pt will perform floor to stand transfer with UE support with SBA.    Time  4    Period  Weeks    Status  New    Target Date  11/01/17      PT LONG TERM GOAL #5   Title  Pt will perform HEP for balance and functional strengthening with husband's assistance.    Time  4    Period  Weeks    Status  New    Target Date  11/01/17             Plan - 09/18/17 0959    Clinical Impression Statement  Pt is a 76 year old lady with progressive decline in memory, gait and balance, and with dementia who presents to PT eval accompanied by her husband.  She is using a quad cane for assistance with ambulation. PMH includes dementia/Alzheimers disease, spinal stenosis, osteoporosis,, seizures and h/o cerebral aneurysm repair in 09/2010. Pt would benefit from use of RW but declines use of this device at this time.                                                                                                                                                                               History and Personal Factors relevant to plan of care:  Dementia/ Alzheimers; decr. safety awareness    Clinical Presentation  Stable    Clinical Presentation due to:  dementia/Alzheimers - gait and balance dysfunction with decr. cognition and decr. safety awareness    Rehab Potential  Fair    Clinical Impairments Affecting Rehab Potential  decreased cognition due to dementia/ Alzheimers    PT Frequency  2x / week    PT Duration  8 weeks    PT Treatment/Interventions  ADLs/Self Care Home Management;Gait training;Stair training;Patient/family education;Neuromuscular re-education;Balance training;Therapeutic exercise;Therapeutic activities    PT Next Visit Plan  instruct in balance and functional strengthening program for HEP    PT Home Exercise Plan  see above     Consulted and Agree with Plan of Care  Patient;Family member/caregiver    Family Member Consulted  husband  Patient  will benefit from skilled therapeutic intervention in order to improve the following deficits and impairments:  Abnormal gait, Decreased balance, Decreased cognition, Decreased safety awareness, Decreased strength, Postural dysfunction  Visit Diagnosis: Other abnormalities of gait and mobility - Plan: PT plan of care cert/re-cert  Muscle weakness (generalized) - Plan: PT plan of care cert/re-cert  Unsteadiness on feet - Plan: PT plan of care cert/re-cert  G-Codes - 09/17/17 1126    Functional Assessment Tool Used (Outpatient Only)  Berg score 46/56:  gait velocity 1.91 ft/sec with SBQC:  TUG score 20.90 secs without device    Functional Limitation  Mobility: Walking and moving around    Mobility: Walking and Moving Around Current Status ([id number])  At least 40 percent but less than 60 percent impaired, limited or restricted    Mobility: Walking and Moving Around Goal Status [phone]([id number])  At least 20 percent but less than 40 percent impaired, limited or restricted        Problem List Patient Active Problem List   Diagnosis Date Noted  . Generalized convulsive epilepsy (HCC) 06/18/2013  . Alzheimer's disease 06/18/2013  . Hypotension 08/31/2012  . Seizure (HCC) 08/29/2012  . Dementia 08/29/2012  . H/O cerebral aneurysm repair 08/29/2012    DildayDonavan Burnet, Leona Pressly Suzanne, PT 09/18/2017, 11:30 AM  [facility] [address] [place], [place], [address] Phone: [phone]   Fax:  [phone]  Name: Jaime Barry MRN: [id number] Date of Birth: 03/24/1941

## 2017-09-24 ENCOUNTER — Ambulatory Visit: Payer: Medicare Other | Admitting: Physical Therapy

## 2017-09-24 ENCOUNTER — Encounter: Payer: Self-pay | Admitting: Physical Therapy

## 2017-09-24 DIAGNOSIS — R2681 Unsteadiness on feet: Secondary | ICD-10-CM

## 2017-09-24 DIAGNOSIS — M6281 Muscle weakness (generalized): Secondary | ICD-10-CM

## 2017-09-24 DIAGNOSIS — R2689 Other abnormalities of gait and mobility: Secondary | ICD-10-CM | POA: Diagnosis not present

## 2017-09-24 NOTE — Therapy (Signed)
[facility] [address] [place], [place], [address] Phone: [phone]   Fax:  [phone]  Physical Therapy Treatment  Patient Details  Name: Jaime Barry MRN: [id number] Date of Birth: [birth year] Referring Provider: Butch Penny, NP   Encounter Date: 09/24/2017  PT End of Session - 09/24/17 0806    Visit Number  2    Number of Visits  9    Date for PT Re-Evaluation  11/01/17    Authorization Type  UHC Medicare - G code and progress note every 10th visit    Authorization Time Period  09-17-17 - 11-16-17    PT Start Time  0803    PT Stop Time  0845    PT Time Calculation (min)  42 min    Activity Tolerance  Patient tolerated treatment well    Behavior During Therapy  [facility] for tasks assessed/performed       Past Medical History:  Diagnosis Date  . Altered mental state 08/29/2012  . Dementia   . Dementia    progressive   . H/O cerebral aneurysm repair    in December of 2011  . Lower leg edema   . Osteoporosis   . Seizure (HCC)   . Seizures (HCC) 08/29/2012  . Spinal stenosis     Past Surgical History:  Procedure Laterality Date  . artery aneurysm clipping Right   . CEREBRAL ANEURYSM REPAIR  09/2010   paraophthalmic artery /note 08/29/2012  . LUMBAR PUNCTURE  08/29/2012  . TONSILLECTOMY      There were no vitals filed for this visit.  Subjective Assessment - 09/24/17 0805    Subjective  No new complaints. No falls since evaluation. Did have some hip pain when she woke up, however it's gone now.     Patient is accompained by:  Family member    Patient Stated Goals  "to be able to function longer and improve balance"         [facility] Adult PT Treatment/Exercise - 09/24/17 0854      Transfers   Transfers  Sit to Stand;Stand to Sit    Sit to Stand  5: Supervision    Stand to Sit  5: Supervision      Ambulation/Gait   Ambulation/Gait  Yes    Ambulation/Gait Assistance  5: Supervision    Ambulation/Gait Assistance Details  cues for more upright posture, increased/equal step length and to maintain wider base of support to prevent scissoring of feet with swing phase    Ambulation Distance (Feet)  100 Feet x3    Assistive device  Small based quad cane    Gait Pattern  Trunk flexed;Decreased step length - right;Decreased step length - left    Ambulation Surface  Level;Indoor          Balance Exercises - 09/24/17 0829      OTAGO PROGRAM   Head Movements  Sitting;5 reps    Neck Movements  Sitting;5 reps    Back Extension  Standing;5 reps    Trunk Movements  Standing;5 reps    Ankle Movements  Sitting;10 reps    Knee Extensor  10 reps;Weight (comment) 2 pounds    Knee Flexor  10 reps;Weight (comment) 2 pounds    Hip ABductor  10 reps;Weight (comment) 2 pounds    Ankle Plantorflexors  20 reps, support        PT Education - 09/24/17 0854    Education provided  Yes    Education Details  initated OTAGO for HEP    Person(s) Educated  Patient;Spouse    Methods  Explanation;Demonstration;Verbal cues;Handout    Comprehension  Verbalized understanding;Verbal cues required;Need further instruction          PT Long Term Goals - 09/18/17 1117      PT LONG TERM GOAL #1   Title  Increase Berg balance test score from 46/56 to >/= 50/56 to reduce fall risk.    Baseline  46/56 on 09-17-17    Time  4    Period  Weeks    Status  New    Target Date  11/01/17 due to pt not starting PT until early Jan. 2019 due to holidays      PT LONG TERM GOAL #2   Title  Improve TUG score from 20.90 secs without device to </= 16.0 secs without device to demo improved functional mobility.    Baseline  20.90 secs without device:  21.62 secs with cane    Time  4    Period  Weeks    Status  New    Target Date  11/01/17      PT LONG TERM GOAL #3   Title  Increase gait velocity from 1.91 ft/sec to >/= 2.4 ft/sec with cane for incr. gait efficiency.      Baseline  17.16 secs = 1.91 ft/sec  on 09-17-17    Time  4    Period  Weeks    Status  New    Target Date  11/01/17      PT LONG TERM GOAL #4   Title  Pt will perform floor to stand transfer with UE support with SBA.    Time  4    Period  Weeks    Status  New    Target Date  11/01/17      PT LONG TERM GOAL #5   Title  Pt will perform HEP for balance and functional strengthening with husband's assistance.    Time  4    Period  Weeks    Status  New    Target Date  11/01/17           Plan - 09/24/17 0807    Clinical Impression Statement  Today's skilled session focused on gait training with quad cane and initiation of OTAGO program for HEP. Pt is progressing toward goals and should benefit from continued PT to progress toward unmet goals.     Rehab Potential  Fair    Clinical Impairments Affecting Rehab Potential  decreased cognition due to dementia/ Alzheimers    PT Frequency  2x / week    PT Duration  8 weeks    PT Treatment/Interventions  ADLs/Self Care Home Management;Gait training;Stair training;Patient/family education;Neuromuscular re-education;Balance training;Therapeutic exercise;Therapeutic activities    PT Next Visit Plan  continue with OTAGO program, try rollator with gait for potential use with a walking program    PT Home Exercise Plan  see above     Consulted and Agree with Plan of Care  Patient;Family member/caregiver    Family Member Consulted  husband       Patient will benefit from skilled therapeutic intervention in order to improve the following deficits and impairments:  Abnormal gait, Decreased balance, Decreased cognition, Decreased safety awareness, Decreased strength, Postural dysfunction  Visit Diagnosis: Muscle weakness (generalized)  Unsteadiness on feet  Other abnormalities of gait and mobility     Problem List Patient Active Problem List   Diagnosis Date Noted  . Generalized  convulsive epilepsy (HCC) 06/18/2013  . Alzheimer's disease 06/18/2013  . Hypotension  08/31/2012  . Seizure (HCC) 08/29/2012  . Dementia 08/29/2012  . H/O cerebral aneurysm repair 08/29/2012    Sallyanne KusterKathy Ethanjames Fontenot, PTA, [facility] [address], [address] [place], [place] [address] [phone] 09/24/17, 9:06 AM   Name: Jaime CoveMary S Barry MRN: [id number] Date of Birth: 03/04/1941

## 2017-10-01 ENCOUNTER — Encounter: Payer: Self-pay | Admitting: Physical Therapy

## 2017-10-01 ENCOUNTER — Ambulatory Visit: Payer: Medicare Other | Admitting: Physical Therapy

## 2017-10-01 DIAGNOSIS — R2681 Unsteadiness on feet: Secondary | ICD-10-CM

## 2017-10-01 DIAGNOSIS — R2689 Other abnormalities of gait and mobility: Secondary | ICD-10-CM

## 2017-10-01 DIAGNOSIS — M6281 Muscle weakness (generalized): Secondary | ICD-10-CM

## 2017-10-01 NOTE — Therapy (Signed)
[facility] [address] [place], [place], [address] Phone: [phone]   Fax:  [phone]  Physical Therapy Treatment  Patient Details  Name: Jaime Barry MRN: [id number] Date of Birth: 02/23/1941 Referring Provider: Butch PennyMegan Millikan, NP   Encounter Date: 10/01/2017  PT End of Session - 10/01/17 0817    Visit Number  3    Number of Visits  9    Date for PT Re-Evaluation  11/01/17    Authorization Type  [employer]HC Medicare - G code and progress note every 10th visit    Authorization Time Period  09-17-17 - 11-16-17    PT Start Time  0815 pt late for appt and then to bathroom when arrived    PT Stop Time  0845    PT Time Calculation (min)  30 min    Equipment Utilized During Treatment  Gait belt    Activity Tolerance  Patient tolerated treatment well    Behavior During Therapy  [facility] for tasks assessed/performed       Past Medical History:  Diagnosis Date  . Altered mental state 08/29/2012  . Dementia   . Dementia    progressive   . H/O cerebral aneurysm repair    in December of 2011  . Lower leg edema   . Osteoporosis   . Seizure (HCC)   . Seizures (HCC) 08/29/2012  . Spinal stenosis     Past Surgical History:  Procedure Laterality Date  . artery aneurysm clipping Right   . CEREBRAL ANEURYSM REPAIR  09/2010   paraophthalmic artery /note 08/29/2012  . LUMBAR PUNCTURE  08/29/2012  . TONSILLECTOMY      There were no vitals filed for this visit.  Subjective Assessment - 10/01/17 0816    Subjective  No new complaints. Having some low back pain today, unsure of cause. No falls. Has done the HEP (OTAGO), spouse reports they also ordered ankle weights from [employer].     Patient is accompained by:  Family member    Patient Stated Goals  "to be able to function longer and improve balance"    Currently in Pain?  No/denies           [facility] Adult PT Treatment/Exercise - 10/01/17 0844      Transfers   Transfers   Sit to Stand;Stand to Sit    Sit to Stand  5: Supervision    Stand to Sit  5: Supervision      Ambulation/Gait   Ambulation/Gait  Yes    Ambulation/Gait Assistance  5: Supervision    Ambulation/Gait Assistance Details  pt with more upright posture, step through gait with improved knee extension with use of walker    Ambulation Distance (Feet)  115 Feet    Assistive device  Rolling walker pt's personal walker    Gait Pattern  Step-through pattern    Ambulation Surface  Level;Indoor          Balance Exercises - 10/01/17 0836      OTAGO PROGRAM   Ankle Plantorflexors  20 reps, support    Ankle Dorsiflexors  20 reps, support    Knee Bends  10 reps, support    Backwards Walking  Support    Sideways Walking  Assistive device counter top support    Tandem Stance  10 seconds, support    Tandem Walk  Support    One Leg Stand  10 seconds, support    Heel Walking  Support    Toe Walk  Support    Sit to Stand  10 reps, no support        PT Education - 10/01/17 1151    Education provided  Yes    Education Details  completed OTAGO program for home    Person(s) Educated  Patient;Spouse    Methods  Explanation;Demonstration;Verbal cues;Handout    Comprehension  Verbalized understanding;Returned demonstration;Need further instruction          PT Long Term Goals - 09/18/17 1117      PT LONG TERM GOAL #1   Title  Increase Berg balance test score from 46/56 to >/= 50/56 to reduce fall risk.    Baseline  46/56 on 09-17-17    Time  4    Period  Weeks    Status  New    Target Date  11/01/17 due to pt not starting PT until early Jan. 2019 due to holidays      PT LONG TERM GOAL #2   Title  Improve TUG score from 20.90 secs without device to </= 16.0 secs without device to demo improved functional mobility.    Baseline  20.90 secs without device:  21.62 secs with cane    Time  4    Period  Weeks    Status  New    Target Date  11/01/17      PT LONG TERM GOAL #3   Title   Increase gait velocity from 1.91 ft/sec to >/= 2.4 ft/sec with cane for incr. gait efficiency.      Baseline  17.16 secs = 1.91 ft/sec on 09-17-17    Time  4    Period  Weeks    Status  New    Target Date  11/01/17      PT LONG TERM GOAL #4   Title  Pt will perform floor to stand transfer with UE support with SBA.    Time  4    Period  Weeks    Status  New    Target Date  11/01/17      PT LONG TERM GOAL #5   Title  Pt will perform HEP for balance and functional strengthening with husband's assistance.    Time  4    Period  Weeks    Status  New    Target Date  11/01/17            Plan - 10/01/17 0818    Clinical Impression Statement  Today's skilled session completed issuing of OTAGO for home program with notebook provided. Remainder of session addressed gait with pt's personal foldable RW. Pt demo's improved posture, knee extension and step length with use of RW vs SBQC. Pt however does not like the RW. Will need continued instruction and encouragement for RW use. Pt is progressing toward goals and should benefit from continued PT to progress toward unmet goals.     Rehab Potential  Fair    Clinical Impairments Affecting Rehab Potential  decreased cognition due to dementia/ Alzheimers    PT Frequency  2x / week    PT Duration  8 weeks    PT Treatment/Interventions  ADLs/Self Care Home Management;Gait training;Stair training;Patient/family education;Neuromuscular re-education;Balance training;Therapeutic exercise;Therapeutic activities    PT Next Visit Plan  continue to work on gait with RW, balance in standing and LE strengthening    PT Home Exercise Plan  see above     Consulted and Agree with Plan of Care  Patient;Family member/caregiver    Family Member Consulted  husband       Patient will benefit from skilled therapeutic intervention in order to improve the following deficits and impairments:  Abnormal gait, Decreased balance, Decreased cognition, Decreased safety  awareness, Decreased strength, Postural dysfunction  Visit Diagnosis: Muscle weakness (generalized)  Unsteadiness on feet  Other abnormalities of gait and mobility     Problem List Patient Active Problem List   Diagnosis Date Noted  . Generalized convulsive epilepsy (HCC) 06/18/2013  . Alzheimer's disease 06/18/2013  . Hypotension 08/31/2012  . Seizure (HCC) 08/29/2012  . Dementia 08/29/2012  . H/O cerebral aneurysm repair 08/29/2012    Sallyanne Kuster, PTA, [facility] [address] [place], [place] [address] [phone] 10/01/17, 11:53 AM   Name: TANZANIA BASHAM MRN: [id number] Date of Birth: 29-Apr-1941

## 2017-10-04 ENCOUNTER — Ambulatory Visit: Payer: BC Managed Care – PPO | Admitting: Rehabilitation

## 2017-10-05 ENCOUNTER — Encounter: Payer: Self-pay | Admitting: Rehabilitation

## 2017-10-05 ENCOUNTER — Ambulatory Visit: Payer: Medicare Other | Attending: Adult Health | Admitting: Rehabilitation

## 2017-10-05 DIAGNOSIS — M6281 Muscle weakness (generalized): Secondary | ICD-10-CM | POA: Diagnosis present

## 2017-10-05 DIAGNOSIS — R2681 Unsteadiness on feet: Secondary | ICD-10-CM | POA: Insufficient documentation

## 2017-10-05 DIAGNOSIS — R2689 Other abnormalities of gait and mobility: Secondary | ICD-10-CM | POA: Diagnosis present

## 2017-10-05 NOTE — Therapy (Signed)
[facility] [address] [place], [place], [address] Phone: [phone]   Fax:  [phone]  Physical Therapy Treatment  Patient Details  Name: Jaime Barry MRN: [id number] Date of Birth: 03/13/1941 Referring Provider: Butch PennyMegan Millikan, NP   Encounter Date: 10/05/2017  PT End of Session - 10/05/17 1046    Visit Number  4    Number of Visits  9    Date for PT Re-Evaluation  11/01/17    Authorization Type  [employer] Medicare - G code and progress note every 10th visit    Authorization Time Period  09-17-17 - 11-16-17    PT Start Time  0845    PT Stop Time  0930    PT Time Calculation (min)  45 min    Equipment Utilized During Treatment  Gait belt    Activity Tolerance  Patient tolerated treatment well    Behavior During Therapy  [facility]/performed       Past Medical History:  Diagnosis Date  . Altered mental state 08/29/2012  . Dementia   . Dementia    progressive   . H/O cerebral aneurysm repair    in December of 2011  . Lower leg edema   . Osteoporosis   . Seizure (HCC)   . Seizures (HCC) 08/29/2012  . Spinal stenosis     Past Surgical History:  Procedure Laterality Date  . artery aneurysm clipping Right   . CEREBRAL ANEURYSM REPAIR  09/2010   paraophthalmic artery /note 08/29/2012  . LUMBAR PUNCTURE  08/29/2012  . TONSILLECTOMY      There were no vitals filed for this visit.  Subjective Assessment - 10/05/17 0849    Subjective  Pt and spouse bring exercises and 1 lb ankle weights with them.      Patient is accompained by:  Family member    Patient Stated Goals  "to be able to function longer and improve balance"    Currently in Pain?  No/denies                      [facility] Adult PT Treatment/Exercise - 10/05/17 0001      Transfers   Transfers  Sit to Stand;Stand to Sit    Sit to Stand  5: Supervision    Stand to Sit  5: Supervision    Comments  Performed during session  for BLE strengthening x 10 reps with cues for slow controlled descent and scooting to edge of mat prior to standing.        Ambulation/Gait   Ambulation/Gait  Yes    Ambulation/Gait Assistance  5: Supervision    Ambulation/Gait Assistance Details  Continue to work on gait with RW for improved endurance and quality.  Discussed using RW for walking program.  Husband states that she likes to walk around [employer] or [employer].  Discussed that she could use shopping cart to improve efficiency and quality, but that her walking around the store needs to be for exercise and that walking should be consecutively to address endurance.  Pt and husband verbalized understanding.      Ambulation Distance (Feet)  230 Feet    Assistive device  Rolling walker    Gait Pattern  Step-through pattern    Ambulation Surface  Level;Indoor    Stairs  Yes    Stairs Assistance  5: Supervision    Stairs Assistance Details (indicate cue type and reason)  Had pt perform stairs  x 4 reps for BLE strengthening in step to fashion x 1 (how she normally performs stairs) and alternating pattern for remaining reps for improved BLE strengthening.      Stair Management Technique  Two rails;Alternating pattern;Step to pattern;Forwards    Number of Stairs  4 x4 reps    Height of Stairs  6          Balance Exercises - 10/05/17 0901      OTAGO PROGRAM   Knee Extensor  10 reps;Weight (comment) 2lbs (1lb on each side)    Knee Flexor  10 reps;Weight (comment) 2lbs    Hip ABductor  10 reps;Weight (comment) 2lbs    Ankle Plantorflexors  20 reps, support w/ 2lbs        PT Education - 10/05/17 1045    Education provided  Yes    Education Details  walking program, performing OTAGO with ankle weights    Person(s) Educated  Patient;Spouse    Methods  Explanation;Demonstration    Comprehension  Verbalized understanding;Returned demonstration;Need further instruction          PT Long Term Goals - 09/18/17 1117      PT LONG  TERM GOAL #1   Title  Increase Berg balance test score from 46/56 to >/= 50/56 to reduce fall risk.    Baseline  46/56 on 09-17-17    Time  4    Period  Weeks    Status  New    Target Date  11/01/17 due to pt not starting PT until early Jan. 2019 due to holidays      PT LONG TERM GOAL #2   Title  Improve TUG score from 20.90 secs without device to </= 16.0 secs without device to demo improved functional mobility.    Baseline  20.90 secs without device:  21.62 secs with cane    Time  4    Period  Weeks    Status  New    Target Date  11/01/17      PT LONG TERM GOAL #3   Title  Increase gait velocity from 1.91 ft/sec to >/= 2.4 ft/sec with cane for incr. gait efficiency.      Baseline  17.16 secs = 1.91 ft/sec on 09-17-17    Time  4    Period  Weeks    Status  New    Target Date  11/01/17      PT LONG TERM GOAL #4   Title  Pt will perform floor to stand transfer with UE support with SBA.    Time  4    Period  Weeks    Status  New    Target Date  11/01/17      PT LONG TERM GOAL #5   Title  Pt will perform HEP for balance and functional strengthening with husband's assistance.    Time  4    Period  Weeks    Status  New    Target Date  11/01/17            Plan - 10/05/17 1046    Clinical Impression Statement  Skilled session went over standing and seated exercises from OTAGO using new ankle weights.  Pt tolerated well and educated to continue these at home.  Also continue to use RW during session for improved quality and endurance.  Discussed walking program using RW, however husband states that she likes to walk around [employer] and [employer].  Verbalized that these places would be  ok and that she can use shopping cart as "walker" but that walking needs to be consecutive to address endurance.  Both verbalized understanding.      Rehab Potential  Fair    Clinical Impairments Affecting Rehab Potential  decreased cognition due to dementia/ Alzheimers    PT Frequency  2x /  week    PT Duration  8 weeks    PT Treatment/Interventions  ADLs/Self Care Home Management;Gait training;Stair training;Patient/family education;Neuromuscular re-education;Balance training;Therapeutic exercise;Therapeutic activities    PT Next Visit Plan  continue to work on gait with RW for endurance, balance in standing and LE strengthening, add hamstring stretch to HEP    PT Home Exercise Plan  see above     Consulted and Agree with Plan of Care  Patient;Family member/caregiver    Family Member Consulted  husband       Patient will benefit from skilled therapeutic intervention in order to improve the following deficits and impairments:  Abnormal gait, Decreased balance, Decreased cognition, Decreased safety awareness, Decreased strength, Postural dysfunction  Visit Diagnosis: Muscle weakness (generalized)  Unsteadiness on feet  Other abnormalities of gait and mobility     Problem List Patient Active Problem List   Diagnosis Date Noted  . Generalized convulsive epilepsy (HCC) 06/18/2013  . Alzheimer's disease 06/18/2013  . Hypotension 08/31/2012  . Seizure (HCC) 08/29/2012  . Dementia 08/29/2012  . H/O cerebral aneurysm repair 08/29/2012    Harriet Butte, PT, MPT [facility] [address] [place], [place], [address] Phone: [phone]   Fax:  [phone] 10/05/17, 10:49 AM  Name: Jaime Barry MRN: [id number] Date of Birth: 1941/04/17

## 2017-10-09 ENCOUNTER — Ambulatory Visit: Payer: Medicare Other | Admitting: Physical Therapy

## 2017-10-09 DIAGNOSIS — R2681 Unsteadiness on feet: Secondary | ICD-10-CM

## 2017-10-09 DIAGNOSIS — R2689 Other abnormalities of gait and mobility: Secondary | ICD-10-CM

## 2017-10-09 DIAGNOSIS — M6281 Muscle weakness (generalized): Secondary | ICD-10-CM | POA: Diagnosis not present

## 2017-10-10 ENCOUNTER — Encounter: Payer: Self-pay | Admitting: Physical Therapy

## 2017-10-10 NOTE — Therapy (Signed)
[facility] [address] [place], [place], [address] Phone: [phone]   Fax:  [phone]  Physical Therapy Treatment  Patient Details  Name: Jaime Barry MRN: [id number] Date of Birth: [birth year] Referring Provider: Butch Penny, NP   Encounter Date: 10/09/2017  PT End of Session - 10/10/17 1044    Visit Number  5    Number of Visits  9    Date for PT Re-Evaluation  11/01/17    Authorization Type  [employer] Medicare - G code and progress note every 10th visit    Authorization Time Period  09-17-17 - 11-16-17    PT Start Time  0849    PT Stop Time  0934    PT Time Calculation (min)  45 min       Past Medical History:  Diagnosis Date  . Altered mental state 08/29/2012  . Dementia   . Dementia    progressive   . H/O cerebral aneurysm repair    in December of 2011  . Lower leg edema   . Osteoporosis   . Seizure (HCC)   . Seizures (HCC) 08/29/2012  . Spinal stenosis     Past Surgical History:  Procedure Laterality Date  . artery aneurysm clipping Right   . CEREBRAL ANEURYSM REPAIR  09/2010   paraophthalmic artery /note 08/29/2012  . LUMBAR PUNCTURE  08/29/2012  . TONSILLECTOMY      There were no vitals filed for this visit.  Subjective Assessment - 10/10/17 1036    Subjective  Pt's husband reports pt had a fall Friday night in a restaurant when her cane got caught on the rug/carpet - states she fell face first but no serious injury; pt using RW today    Patient is accompained by:  Family member husband    Patient Stated Goals  "to be able to function longer and improve balance"    Currently in Pain?  No/denies                RW was adjusted correctly for pt (was raised for more upright posture during gait)      [facility] Adult PT Treatment/Exercise - 10/10/17 0001      Transfers   Transfers  Sit to Stand;Stand to Sit    Sit to Stand  5: Supervision    Stand to Sit  4: Min guard    Number  of Reps  10 reps    Comments  5 reps w/feet on floor;  5 reps with feet on blue Airex - no UE support used      Ambulation/Gait   Ambulation/Gait  Yes    Ambulation/Gait Assistance  5: Supervision;4: Min guard    Ambulation/Gait Assistance Details  RW raised to appropriate height for patient;    Ambulation Distance (Feet)  200 Feet outside on pavement; 150' inside    Assistive device  Rolling walker    Gait Pattern  Step-through pattern    Ambulation Surface  Level;Unlevel;Indoor;Outdoor;Paved    Stairs  Yes    Stairs Assistance  5: Supervision    Stair Management Technique  Two rails;Alternating pattern    Number of Stairs  4    Height of Stairs  6    Curb  5: Supervision outside curb - used [employer] Details (indicate cue type and reason)  cues given for pt to place feet close to curb with ascension and descension of curb outside ; pt able to place RW  on and off curb independently for curb negotiation          Balance Exercises - 10/10/17 1043      Balance Exercises: Standing   Standing Eyes Opened  Wide (BOA);Foam/compliant surface;1 rep;10 secs    Other Standing Exercises  marching on blue Airex 10 reps each LE with CGA; standing on foam - alternating UE flexion for trunk stabilization - CGA for safety      Pt performed cone taps and touching balance bubbles with CGA to min assist for safety with LOB (objects on floor)  Pt performed stepping over orange hurdle x 2 reps with use of RW and performed Fig. 8's around cones with use of RW With CGA for improved balance with turning and for incr. Safety with use of RW - cues given for pt to stay close to RW  With this activity       PT Long Term Goals - 09/18/17 1117      PT LONG TERM GOAL #1   Title  Increase Berg balance test score from 46/56 to >/= 50/56 to reduce fall risk.    Baseline  46/56 on 09-17-17    Time  4    Period  Weeks    Status  New    Target Date  11/01/17 due to pt not starting PT until early Jan.  2019 due to holidays      PT LONG TERM GOAL #2   Title  Improve TUG score from 20.90 secs without device to </= 16.0 secs without device to demo improved functional mobility.    Baseline  20.90 secs without device:  21.62 secs with cane    Time  4    Period  Weeks    Status  New    Target Date  11/01/17      PT LONG TERM GOAL #3   Title  Increase gait velocity from 1.91 ft/sec to >/= 2.4 ft/sec with cane for incr. gait efficiency.      Baseline  17.16 secs = 1.91 ft/sec on 09-17-17    Time  4    Period  Weeks    Status  New    Target Date  11/01/17      PT LONG TERM GOAL #4   Title  Pt will perform floor to stand transfer with UE support with SBA.    Time  4    Period  Weeks    Status  New    Target Date  11/01/17      PT LONG TERM GOAL #5   Title  Pt will perform HEP for balance and functional strengthening with husband's assistance.    Time  4    Period  Weeks    Status  New    Target Date  11/01/17            Plan - 10/10/17 1045    Clinical Impression Statement  Pt's cognitive deficits/dementia limits progress and carryover; pt demonstrates decr. safety awareness and needs cues with stand to/from sit transfers with use of RW; she has tendency to leave RW several feet away from where she is going to sit down and takes steps without RW without cues to keep it close to her;      Clinical Impairments Affecting Rehab Potential  decreased cognition due to dementia/ Alzheimers    PT Frequency  2x / week    PT Duration  4 weeks    PT Treatment/Interventions  ADLs/Self Care Home  Management;Gait training;Stair training;Patient/family education;Neuromuscular re-education;Balance training;Therapeutic exercise;Therapeutic activities    PT Next Visit Plan  cont gait with RW; cont balance training - plan D/C 10-18-17    PT Home Exercise Plan  see above     Consulted and Agree with Plan of Care  Patient;Family member/caregiver    Family Member Consulted  husband       Patient  will benefit from skilled therapeutic intervention in order to improve the following deficits and impairments:  Abnormal gait, Decreased balance, Decreased cognition, Decreased safety awareness, Decreased strength, Postural dysfunction  Visit Diagnosis: Other abnormalities of gait and mobility  Unsteadiness on feet     Problem List Patient Active Problem List   Diagnosis Date Noted  . Generalized convulsive epilepsy (HCC) 06/18/2013  . Alzheimer's disease 06/18/2013  . Hypotension 08/31/2012  . Seizure (HCC) 08/29/2012  . Dementia 08/29/2012  . H/O cerebral aneurysm repair 08/29/2012    DildayDonavan Burnet, PT 10/10/2017, 10:51 AM  [facility] [address] [place], [place], [address] Phone: [phone]   Fax:  [phone]  Name: HONESTI SEABERG MRN: [id number] Date of Birth: 01-23-1941

## 2017-10-11 ENCOUNTER — Ambulatory Visit: Payer: Medicare Other | Admitting: Physical Therapy

## 2017-10-11 ENCOUNTER — Encounter: Payer: Self-pay | Admitting: Physical Therapy

## 2017-10-11 DIAGNOSIS — R2689 Other abnormalities of gait and mobility: Secondary | ICD-10-CM

## 2017-10-11 DIAGNOSIS — R2681 Unsteadiness on feet: Secondary | ICD-10-CM

## 2017-10-11 DIAGNOSIS — M6281 Muscle weakness (generalized): Secondary | ICD-10-CM | POA: Diagnosis not present

## 2017-10-11 NOTE — Therapy (Signed)
[facility] [address] [place], [place], [address] Phone: [phone]   Fax:  [phone]  Physical Therapy Treatment  Patient Details  Name: Jaime Barry MRN: [id number] Date of Birth: 1940-10-09 Referring Provider: Butch Penny, NP   Encounter Date: 10/11/2017  PT End of Session - 10/11/17 0853    Visit Number  6    Number of Visits  9    Date for PT Re-Evaluation  11/01/17    Authorization Type  [employer] Medicare - G code and progress note every 10th visit    Authorization Time Period  09-17-17 - 11-16-17    PT Start Time  0849    PT Stop Time  0928    PT Time Calculation (min)  39 min    Equipment Utilized During Treatment  Gait belt    Activity Tolerance  Patient tolerated treatment well    Behavior During Therapy  [facility] for tasks assessed/performed       Past Medical History:  Diagnosis Date  . Altered mental state 08/29/2012  . Dementia   . Dementia    progressive   . H/O cerebral aneurysm repair    in December of 2011  . Lower leg edema   . Osteoporosis   . Seizure (HCC)   . Seizures (HCC) 08/29/2012  . Spinal stenosis     Past Surgical History:  Procedure Laterality Date  . artery aneurysm clipping Right   . CEREBRAL ANEURYSM REPAIR  09/2010   paraophthalmic artery /note 08/29/2012  . LUMBAR PUNCTURE  08/29/2012  . TONSILLECTOMY      There were no vitals filed for this visit.  Subjective Assessment - 10/11/17 0852    Subjective  No new falls. Spouse reports she saw her chiropracter this am for an adjustment to her spine. Pt reports feeling less pain afterwards, no pain currently.     Patient is accompained by:  Family member spouse    Patient Stated Goals  "to be able to function longer and improve balance"    Currently in Pain?  No/denies          [facility] Adult PT Treatment/Exercise - 10/11/17 0854      Transfers   Transfers  Sit to Stand;Stand to Sit    Sit to Stand  5: Supervision     Sit to Stand Details  Verbal cues for precautions/safety;Verbal cues for safe use of DME/AE    Sit to Stand Details (indicate cue type and reason)  cues for hand placement for safety with standing to RW    Stand to Sit  4: Min guard    Stand to Sit Details (indicate cue type and reason)  Verbal cues for precautions/safety;Verbal cues for safe use of DME/AE    Stand to Sit Details  cues to keep RW with her when approaching surface to sit down (pt tends to park and try to walk away from it) and to reach back when sitting down      Ambulation/Gait   Ambulation/Gait  Yes    Ambulation/Gait Assistance  5: Supervision;4: Min guard    Ambulation/Gait Assistance Details  had pt negotiating narrow spaces and around furniture/barriers during both gait reps with cues to focus on task at hand (pt distractable both internally and externally). cues on posture and position with RW as well.     Ambulation Distance (Feet)  220 Feet x2 reps    Assistive device  Rolling walker    Gait Pattern  Step-through pattern;Decreased stride length;Trunk flexed    Ambulation Surface  Level;Indoor    Stairs  Yes    Stairs Assistance  5: Supervision    Stairs Assistance Details (indicate cue type and reason)  supervision for safety, no cues or assistance needed    Stair Management Technique  Two rails;Alternating pattern;Step to pattern;Forwards    Number of Stairs  4    Height of Stairs  6    Ramp  4: Min assist    Ramp Details (indicate cue type and reason)  cues to stay closer to walker and assist to control walker speed with descent     Curb  Other (comment) min guard assist    Curb Details (indicate cue type and reason)  cues for safety with adavancement of RW and to get closer before moving walker so not to be walking with only 2 feet of walker in contact with ground/curb          Balance Exercises - 10/11/17 0912      Balance Exercises: Standing   Standing Eyes Opened  Narrow base of support (BOS);Wide  (BOA);Head turns;Foam/compliant surface;Other reps (comment);30 secs;Limitations    SLS with Vectors  Solid surface;Limitations      Balance Exercises: Standing   Standing Eyes Opened Limitations  on airex in corner with chair in front for safety: EC no head movements with narrow base of support, progressing to EC head movements left<>right, up<>down and diagonals both ways with wide base of support. No UE support with min guard to min assist for balance. cues on posture and weight shifting to assist with balance. cues to stay on task.     SLS with Vectors Limitations  2 foam bubbles on floor: alternating fwd toe taps with no UE support, min guard to min assist for balance. cues on posture, stance position and weight shifting.             PT Long Term Goals - 09/18/17 1117      PT LONG TERM GOAL #1   Title  Increase Berg balance test score from 46/56 to >/= 50/56 to reduce fall risk.    Baseline  46/56 on 09-17-17    Time  4    Period  Weeks    Status  New    Target Date  11/01/17 due to pt not starting PT until early Jan. 2019 due to holidays      PT LONG TERM GOAL #2   Title  Improve TUG score from 20.90 secs without device to </= 16.0 secs without device to demo improved functional mobility.    Baseline  20.90 secs without device:  21.62 secs with cane    Time  4    Period  Weeks    Status  New    Target Date  11/01/17      PT LONG TERM GOAL #3   Title  Increase gait velocity from 1.91 ft/sec to >/= 2.4 ft/sec with cane for incr. gait efficiency.      Baseline  17.16 secs = 1.91 ft/sec on 09-17-17    Time  4    Period  Weeks    Status  New    Target Date  11/01/17      PT LONG TERM GOAL #4   Title  Pt will perform floor to stand transfer with UE support with SBA.    Time  4    Period  Weeks    Status  New  Target Date  11/01/17      PT LONG TERM GOAL #5   Title  Pt will perform HEP for balance and functional strengthening with husband's assistance.    Time  4     Period  Weeks    Status  New    Target Date  11/01/17            Plan - 10/11/17 0853    Clinical Impression Statement  Today's skilled session continued to focus on safe use of RW with gait and transfers with limited carryover noted without cues for safety. Also continued to address balance deficits with up to min assist needed for balance recovery. Pt should benefit from continued PT to progress as much as possible toward goals.     Clinical Impairments Affecting Rehab Potential  decreased cognition due to dementia/ Alzheimers    PT Frequency  2x / week    PT Duration  4 weeks    PT Treatment/Interventions  ADLs/Self Care Home Management;Gait training;Stair training;Patient/family education;Neuromuscular re-education;Balance training;Therapeutic exercise;Therapeutic activities    PT Next Visit Plan  cont gait with RW; cont balance training - plan D/C 10-18-17    PT Home Exercise Plan  has OTAGO and walking program for HEP.     Consulted and Agree with Plan of Care  Patient;Family member/caregiver    Family Member Consulted  husband       Patient will benefit from skilled therapeutic intervention in order to improve the following deficits and impairments:  Abnormal gait, Decreased balance, Decreased cognition, Decreased safety awareness, Decreased strength, Postural dysfunction  Visit Diagnosis: Other abnormalities of gait and mobility  Unsteadiness on feet  Muscle weakness (generalized)     Problem List Patient Active Problem List   Diagnosis Date Noted  . Generalized convulsive epilepsy (HCC) 06/18/2013  . Alzheimer's disease 06/18/2013  . Hypotension 08/31/2012  . Seizure (HCC) 08/29/2012  . Dementia 08/29/2012  . H/O cerebral aneurysm repair 08/29/2012    Sallyanne Kuster, PTA, [facility] [address] [place], [place] [address] [phone] 10/11/17, 11:49 AM  Name: Jaime Barry MRN: [id number] Date of Birth: [birth year]

## 2017-10-15 ENCOUNTER — Ambulatory Visit: Payer: Medicare Other | Admitting: Physical Therapy

## 2017-10-15 ENCOUNTER — Encounter: Payer: Self-pay | Admitting: Physical Therapy

## 2017-10-15 DIAGNOSIS — R2689 Other abnormalities of gait and mobility: Secondary | ICD-10-CM

## 2017-10-15 DIAGNOSIS — R2681 Unsteadiness on feet: Secondary | ICD-10-CM

## 2017-10-15 DIAGNOSIS — M6281 Muscle weakness (generalized): Secondary | ICD-10-CM | POA: Diagnosis not present

## 2017-10-15 NOTE — Therapy (Signed)
Jaime Barry [address] [place], [place], [address] Phone: [phone]   Fax:  [phone]  Physical Therapy Treatment  Patient Details  Name: Jaime Barry MRN: [id number] Date of Birth: [birth year] Referring Provider: Butch Penny, NP   Encounter Date: 10/15/2017  PT End of Session - 10/15/17 1034    Visit Number  7    Number of Visits  9    Date for PT Re-Evaluation  11/01/17    Authorization Type  [employer] Medicare - G code and progress note every 10th visit    Authorization Time Period  09-17-17 - 11-16-17    PT Start Time  0801    PT Stop Time  0835 pt requested to end session early due to not feeling well    PT Time Calculation (min)  34 min       Past Medical History:  Diagnosis Date  . Altered mental state 08/29/2012  . Dementia   . Dementia    progressive   . H/O cerebral aneurysm repair    in December of 2011  . Lower leg edema   . Osteoporosis   . Seizure (HCC)   . Seizures (HCC) 08/29/2012  . Spinal stenosis     Past Surgical History:  Procedure Laterality Date  . artery aneurysm clipping Right   . CEREBRAL ANEURYSM REPAIR  09/2010   paraophthalmic artery /note 08/29/2012  . LUMBAR PUNCTURE  08/29/2012  . TONSILLECTOMY      There were no vitals filed for this visit.  Subjective Assessment - 10/15/17 1026    Subjective  Pt states she did not sleep well last night due to frequent urination during the night - also c/o "hurting all over this morning"    Patient is accompained by:  Family member    Patient Stated Goals  "to be able to function longer and improve balance"                      [facility] Adult PT Treatment/Exercise - 10/15/17 0814      Ambulation/Gait   Ambulation/Gait  Yes    Ambulation/Gait Assistance  5: Supervision    Ambulation/Gait Assistance Details  cues to stand erect and stay close to RW    Ambulation Distance (Feet)  75 Feet    Assistive device  Rolling  walker    Gait Pattern  Step-through pattern;Decreased stride length;Trunk flexed    Ambulation Surface  Level;Indoor      Exercises   Exercises  Knee/Hip      Knee/Hip Exercises: Aerobic   Recumbent Bike  SciFit level 1.5 x 3" pt c/o neck pain with this ex - stopped at 3"          Balance Exercises - 10/15/17 1031      Balance Exercises: Standing   Other Standing Exercises  Pt performed standing hip flexion, abduction and marching x 10 reps each; pt stated she was unable to do hip extension due to c/o low back pain in standing         PT Education - 10/15/17 1033    Education provided  Yes    Education Details  added 3 kicks (forward, back and side) to balance HEP and also added marching in place    Person(s) Educated  Patient;Spouse    Methods  Explanation;Demonstration;Handout    Comprehension  Verbalized understanding;Returned demonstration          PT Long Term Goals - 09/18/17  1117      PT LONG TERM GOAL #1   Title  Increase Berg balance test score from 46/56 to >/= 50/56 to reduce fall risk.    Baseline  46/56 on 09-17-17    Time  4    Period  Weeks    Status  New    Target Date  11/01/17 due to pt not starting PT until early Jan. 2019 due to holidays      PT LONG TERM GOAL #2   Title  Improve TUG score from 20.90 secs without device to </= 16.0 secs without device to demo improved functional mobility.    Baseline  20.90 secs without device:  21.62 secs with cane    Time  4    Period  Weeks    Status  New    Target Date  11/01/17      PT LONG TERM GOAL #3   Title  Increase gait velocity from 1.91 ft/sec to >/= 2.4 ft/sec with cane for incr. gait efficiency.      Baseline  17.16 secs = 1.91 ft/sec on 09-17-17    Time  4    Period  Weeks    Status  New    Target Date  11/01/17      PT LONG TERM GOAL #4   Title  Pt will perform floor to stand transfer with UE support with SBA.    Time  4    Period  Weeks    Status  New    Target Date  11/01/17       PT LONG TERM GOAL #5   Title  Pt will perform HEP for balance and functional strengthening with husband's assistance.    Time  4    Period  Weeks    Status  New    Target Date  11/01/17            Plan - 10/15/17 1035    Clinical Impression Statement  Pt had limited standing tolerance today due to c/o low back/hip pain and also due to soiled Depends due to urine incontinence.  Pt very distracted during PT with focus on back pain and incontinence - requested to end session early today due to not feeling well.      Rehab Potential  Fair    Clinical Impairments Affecting Rehab Potential  decreased cognition due to dementia/ Alzheimers    PT Frequency  2x / week    PT Duration  4 weeks    PT Treatment/Interventions  ADLs/Self Care Home Management;Gait training;Stair training;Patient/family education;Neuromuscular re-education;Balance training;Therapeutic exercise;Therapeutic activities    PT Next Visit Plan  D/C next session - check LTG's    PT Home Exercise Plan  has OTAGO and walking program for HEP.  - added kicks (3 directions and marching) to HEP    Consulted and Agree with Plan of Care  Patient;Family member/caregiver    Family Member Consulted  husband       Patient will benefit from skilled therapeutic intervention in order to improve the following deficits and impairments:  Abnormal gait, Decreased balance, Decreased cognition, Decreased safety awareness, Decreased strength, Postural dysfunction  Visit Diagnosis: Other abnormalities of gait and mobility  Unsteadiness on feet     Problem List Patient Active Problem List   Diagnosis Date Noted  . Generalized convulsive epilepsy (HCC) 06/18/2013  . Alzheimer's disease 06/18/2013  . Hypotension 08/31/2012  . Seizure (HCC) 08/29/2012  . Dementia 08/29/2012  . H/O cerebral aneurysm  repair 08/29/2012    Kary Kos, PT 10/15/2017, 10:40 AM  [facility] [address] [place], [place], [address] Phone: [phone]   Fax:  [phone]  Name: Jaime Barry MRN: [id number] Date of Birth: Aug 14, 1941

## 2017-10-15 NOTE — Patient Instructions (Signed)
HIP: Abduction - Standing    Squeeze glutes. Raise leg out and slightly back. _10__ reps per set, _1__ sets per day, _5__ days per week Hold onto a support.  ALTERNATE SIDE KICKS - 10 reps each leg   HIP / KNEE: Extension - Standing    Squeeze glutes. Raise and lift leg backward. Keep knee straight or slightly bent. _10__ reps per set, _1__ sets per day, _5__ days per week Hold onto a support.  ALTERNATE BACKWARD KICKS - 10 reps each leg   HIP: Flexion / KNEE: Extension, Heel Strike - Standing    Reach leg out to take step, knee straight. Land with heel on floor, toes up. __10_ reps per set, _1__ sets per day, _5__ days per week Hold onto a support.  ALTERNATE FORWARD KICKS - 10 reps each leg  Copyright  VHI. All rights reserved.  Hip Flexion (Standing)    Stand with support. Squeeze pelvic floor and hold. Lift right knee upward. Hold for _2_ seconds. Relax for _1__ seconds. Repeat _10__ times. Do _1__ times a day.  MARCHING  IN PLACE- 10 reps each leg    Copyright  VHI. All rights reserved.

## 2017-10-18 ENCOUNTER — Encounter: Payer: Self-pay | Admitting: Physical Therapy

## 2017-10-18 ENCOUNTER — Ambulatory Visit: Payer: Medicare Other | Admitting: Physical Therapy

## 2017-10-18 DIAGNOSIS — R2681 Unsteadiness on feet: Secondary | ICD-10-CM

## 2017-10-18 DIAGNOSIS — R2689 Other abnormalities of gait and mobility: Secondary | ICD-10-CM

## 2017-10-18 DIAGNOSIS — M6281 Muscle weakness (generalized): Secondary | ICD-10-CM | POA: Diagnosis not present

## 2017-10-18 NOTE — Patient Instructions (Signed)
Fall Prevention in the Home Falls can cause injuries and can affect people from all age groups. There are many simple things that you can do to make your home safe and to help prevent falls. What can I do on the outside of my home?  Regularly repair the edges of walkways and driveways and fix any cracks.  Remove high doorway thresholds.  Trim any shrubbery on the main path into your home.  Use bright outdoor lighting.  Clear walkways of debris and clutter, including tools and rocks.  Regularly check that handrails are securely fastened and in good repair. Both sides of any steps should have handrails.  Install guardrails along the edges of any raised decks or porches.  Have leaves, snow, and ice cleared regularly.  Use sand or salt on walkways during winter months.  In the garage, clean up any spills right away, including grease or oil spills. What can I do in the bathroom?  Use night lights.  Install grab bars by the toilet and in the tub and shower. Do not use towel bars as grab bars.  Use non-skid mats or decals on the floor of the tub or shower.  If you need to sit down while you are in the shower, use a plastic, non-slip stool.  Keep the floor dry. Immediately clean up any water that spills on the floor.  Remove soap buildup in the tub or shower on a regular basis.  Attach bath mats securely with double-sided non-slip rug tape.  Remove throw rugs and other tripping hazards from the floor. What can I do in the bedroom?  Use night lights.  Make sure that a bedside light is easy to reach.  Do not use oversized bedding that drapes onto the floor.  Have a firm chair that has side arms to use for getting dressed.  Remove throw rugs and other tripping hazards from the floor. What can I do in the kitchen?  Clean up any spills right away.  Avoid walking on wet floors.  Place frequently used items in easy-to-reach places.  If you need to reach for something above  you, use a sturdy step stool that has a grab bar.  Keep electrical cables out of the way.  Do not use floor polish or wax that makes floors slippery. If you have to use wax, make sure that it is non-skid floor wax.  Remove throw rugs and other tripping hazards from the floor. What can I do in the stairways?  Do not leave any items on the stairs.  Make sure that there are handrails on both sides of the stairs. Fix handrails that are broken or loose. Make sure that handrails are as long as the stairways.  Check any carpeting to make sure that it is firmly attached to the stairs. Fix any carpet that is loose or worn.  Avoid having throw rugs at the top or bottom of stairways, or secure the rugs with carpet tape to prevent them from moving.  Make sure that you have a light switch at the top of the stairs and the bottom of the stairs. If you do not have them, have them installed. What are some other fall prevention tips?  Wear closed-toe shoes that fit well and support your feet. Wear shoes that have rubber soles or low heels.  When you use a stepladder, make sure that it is completely opened and that the sides are firmly locked. Have someone hold the ladder while you are using   it. Do not climb a closed stepladder.  Add color or contrast paint or tape to grab bars and handrails in your home. Place contrasting color strips on the first and last steps.  Use mobility aids as needed, such as canes, walkers, scooters, and crutches.  Turn on lights if it is dark. Replace any light bulbs that burn out.  Set up furniture so that there are clear paths. Keep the furniture in the same spot.  Fix any uneven floor surfaces.  Choose a carpet design that does not hide the edge of steps of a stairway.  Be aware of any and all pets.  Review your medicines with your healthcare provider. Some medicines can cause dizziness or changes in blood pressure, which increase your risk of falling. Talk with  your health care provider about other ways that you can decrease your risk of falls. This may include working with a physical therapist or trainer to improve your strength, balance, and endurance. This information is not intended to replace advice given to you by your health care provider. Make sure you discuss any questions you have with your health care provider. Document Released: 09/08/2002 Document Revised: 02/15/2016 Document Reviewed: 10/23/2014 Elsevier Interactive Patient Education  2018 Elsevier Inc.  

## 2017-10-18 NOTE — Therapy (Signed)
Jaime Barry [address] [place], [place], [address] Phone: [phone]   Fax:  [phone]  Physical Therapy Treatment  Patient Details  Name: Jaime Barry MRN: [id number] Date of Birth: 01-05-1941 Referring Provider: Ward Givens, NP   Encounter Date: 10/18/2017  PT End of Session - 10/18/17 2019    Visit Number  8    Number of Visits  9    Date for PT Re-Evaluation  11/01/17    Authorization Type  [employer] Medicare - G code and progress note every 10th visit    Authorization Time Period  09-17-17 - 11-16-17    PT Start Time  0802    PT Stop Time  0841    PT Time Calculation (min)  39 min       Past Medical History:  Diagnosis Date  . Altered mental state 08/29/2012  . Dementia   . Dementia    progressive   . H/O cerebral aneurysm repair    in December of 2011  . Lower leg edema   . Osteoporosis   . Seizure ([place])   . Seizures ([place]) 08/29/2012  . Spinal stenosis     Past Surgical History:  Procedure Laterality Date  . artery aneurysm clipping Right   . CEREBRAL ANEURYSM REPAIR  09/2010   paraophthalmic artery /note 08/29/2012  . LUMBAR PUNCTURE  08/29/2012  . TONSILLECTOMY      There were no vitals filed for this visit.  Subjective Assessment - 10/18/17 2007    Subjective  Pt states frequent urination (incontinence) is worse today than it was on Monday this week;  states she wants to call her MD to get medication for this problem    Patient is accompained by:  Family member    Patient Stated Goals  "to be able to function longer and improve balance"                      [facility] Adult PT Treatment/Exercise - 10/18/17 0824      Transfers   Transfers  Sit to Stand    Sit to Stand  5: Supervision    Sit to Stand Details  Verbal cues for precautions/safety;Verbal cues for safe use of DME/AE    Sit to Stand Details (indicate cue type and reason)  cues to reach back prior to sitting      Ambulation/Gait   Ambulation/Gait  Yes    Ambulation/Gait Assistance  5: Supervision    Ambulation/Gait Assistance Details  cues to stand erect and to stay close to RW    Ambulation Distance (Feet)  115 Feet 2 reps    Assistive device  Rolling walker    Gait Pattern  Step-through pattern;Decreased stride length;Trunk flexed    Ambulation Surface  Level;Indoor    Ramp  5: Supervision    Ramp Details (indicate cue type and reason)  cues to stay close to RW    Curb  4: Min assist    Curb Details (indicate cue type and reason)  cues for proper placement of RW       Self-Care   Self-Care  Other Self-Care Comments Fall prevention handout; home safety checklist      TUG score 35.22 secs 1st trial:  31.59 secs 2nd rep with RW  Gait velocity 14.78 secs = 2.22 ft/sec with RW       PT Education - 10/18/17 2018    Education provided  Yes    Education  Details  handout of fall prevention techniques/home safety checklist    Person(s) Educated  Patient;Spouse    Methods  Explanation;Handout    Comprehension  Verbalized understanding          PT Long Term Goals - 10/18/17 2020      PT LONG TERM GOAL #1   Title  Increase Berg balance test score from 46/56 to >/= 50/56 to reduce fall risk.    Baseline  46/56 on 09-17-17:  Jaime Barry not reassessed due to pt's c/o's discomfort with urinary incontinence which she states is getting worse; no significant changes in balance noted since initial eval    Status  Not Met      PT LONG TERM GOAL #2   Title  Improve TUG score from 20.90 secs without device to </= 16.0 secs without device to demo improved functional mobility.    Baseline  20.90 secs without device:  21.62 secs with cane           35.22, 31.59 secs with RW    Status  Not Met      PT LONG TERM GOAL #3   Title  Increase gait velocity from 1.91 ft/sec to >/= 2.4 ft/sec with cane for incr. gait efficiency.      Baseline  14.78 secs = 2.22 ft/sec with RW    Status  Not Met      PT LONG  TERM GOAL #4   Title  Pt will perform floor to stand transfer with UE support with SBA.    Baseline  pt declined performing this activity on 10-18-17 due to urinary incontinence    Status  Deferred      PT LONG TERM GOAL #5   Title  Pt will perform HEP for balance and functional strengthening with husband's assistance.    Baseline  met 10-18-17    Status  Achieved            Plan - 10/18/17 2029    Clinical Impression Statement  Pt very distracted in PT session today due to urinary incontinence - repeated throughout session that she was having "leakage" and stated her Depends was becoming saturated. Husband reported that pt was urinating every 30".  Pt has only met LTG #5 - pt is using RW for assistance with amb. but does not like using this device, as she states she would use quad cane.  Safety has been stressed to pt with recommendation for use of RW at all times with community ambulation - pt states she does not have room to use this device in her home.  Pt's dementia limits progress and safety awareness.  Pt is discharged due to lack of progress in combination with completion of program.      Clinical Impairments Affecting Rehab Potential  decreased cognition due to dementia/ Alzheimers    PT Duration  6 weeks    PT Treatment/Interventions  ADLs/Self Care Home Management;Gait training;Stair training;Patient/family education;Neuromuscular re-education;Balance training;Therapeutic exercise;Therapeutic activities    PT Next Visit Plan  D/C today 10-18-17    PT Home Exercise Plan  has OTAGO and walking program for HEP.  - added kicks (3 directions and marching) to HEP    Consulted and Agree with Plan of Care  Patient;Family member/caregiver    Family Member Consulted  husband       Patient will benefit from skilled therapeutic intervention in order to improve the following deficits and impairments:  Abnormal gait, Decreased balance, Decreased cognition, Decreased safety awareness, Decreased  strength, Postural dysfunction  Visit Diagnosis: Other abnormalities of gait and mobility  Unsteadiness on feet     Problem List Patient Active Problem List   Diagnosis Date Noted  . Generalized convulsive epilepsy ([place]) 06/18/2013  . Alzheimer's disease 06/18/2013  . Hypotension 08/31/2012  . Seizure ([place]) 08/29/2012  . Dementia 08/29/2012  . H/O cerebral aneurysm repair 08/29/2012     PHYSICAL THERAPY DISCHARGE SUMMARY  Visits from Start of Care:  8  Current functional level related to goals / functional outcomes: See above for progress towards goals; only LTG #5 has been achieved   Pt is now using RW rather than [facility] for assistance with ambulation - therefore, she is safer with ambulation due to incr. stability provided by the RW   Remaining deficits: Continued decreased standing balance Continued decr. Independence and safety with gait with pt requiring use of RW for safety   Education / Equipment: Pt and husband have been instructed in HEP for balance and LE strengthening.  Pt requires assistance to perform HEP due to dementia/cognitive deficits.  Plan: Patient agrees to discharge.  Patient goals were not met. Patient is being discharged due to lack of progress.  ?????       Alda Lea, PT 10/18/2017, 8:38 PM  [facility] [address] [place], [place], [address] Phone: [phone]   Fax:  [phone]  Name: Jaime Barry MRN: [id number] Date of Birth: 11/11/1940

## 2018-03-05 ENCOUNTER — Ambulatory Visit: Payer: Medicare Other | Admitting: Neurology

## 2018-03-05 ENCOUNTER — Encounter: Payer: Self-pay | Admitting: Neurology

## 2018-03-05 VITALS — BP 96/59 | HR 89 | Ht 62.0 in | Wt 128.0 lb

## 2018-03-05 DIAGNOSIS — G40309 Generalized idiopathic epilepsy and epileptic syndromes, not intractable, without status epilepticus: Secondary | ICD-10-CM | POA: Diagnosis not present

## 2018-03-05 DIAGNOSIS — F0280 Dementia in other diseases classified elsewhere without behavioral disturbance: Secondary | ICD-10-CM | POA: Diagnosis not present

## 2018-03-05 DIAGNOSIS — G301 Alzheimer's disease with late onset: Secondary | ICD-10-CM

## 2018-03-05 NOTE — Progress Notes (Signed)
Reason for visit: Memory disturbance, gait disturbance  Jaime Barry is an 77 y.o. female  History of present illness:  Jaime Barry is a 77 year old right-handed white female with a history of well-controlled seizures, she has a memory disturbance and a gait disturbance.  The patient reports that she also has significant issues with urinary incontinence and she wears adult diapers for this.  The patient does not sleep well at nighttime, she generally will nap off and on during the day.  She has significant psychiatric disease and is followed by Dr. Donell BeersPlovsky for this.  The patient is on Namenda, she tolerates this fairly well and she takes Keppra for her seizures.  She is not on Aricept or Exelon secondary to the history of seizures.  The patient is not operating a motor vehicle.  She has not given up any activities of daily living since last seen.  She does crossword puzzles regularly.  She does keep up with her own medications but she needs help with her appointments.  The patient has been using a quad cane for ambulation, she does not use the cane inside the house, she will fall on occasion, the last fall was yesterday.  The patient has some low back pain, she is stooped with walking.  She did have physical therapy for gait training in early 2019.  The patient returns for an evaluation.  Overall, the memory issue has been relatively stable.  Past Medical History:  Diagnosis Date  . Altered mental state 08/29/2012  . Dementia   . Dementia    progressive   . H/O cerebral aneurysm repair    in December of 2011  . Lower leg edema   . Osteoporosis   . Seizure (HCC)   . Seizures (HCC) 08/29/2012  . Spinal stenosis     Past Surgical History:  Procedure Laterality Date  . artery aneurysm clipping Right   . CEREBRAL ANEURYSM REPAIR  09/2010   paraophthalmic artery /note 08/29/2012  . LUMBAR PUNCTURE  08/29/2012  . TONSILLECTOMY      Family History  Problem Relation Age of Onset  .  Alzheimer's disease Mother   . Leukemia Father        Died in his early 1750s    Social history:  reports that she has quit smoking. She has never used smokeless tobacco. She reports that she does not drink alcohol or use drugs.   No Known Allergies  Medications:  Prior to Admission medications   Medication Sig Start Date End Date Taking? Authorizing Provider  Cholecalciferol (VITAMIN D3 PO) Take by mouth.   Yes [provider]  denosumab (PROLIA) 60 MG/ML SOLN injection Inject 60 mg into the skin every 6 (six) months. Administer in upper arm, thigh, or abdomen   Yes [provider]  divalproex (DEPAKOTE ER) 500 MG 24 hr tablet Take 500 mg by mouth 2 (two) times daily.  03/22/15  Yes [provider]  levETIRAcetam (KEPPRA) 500 MG tablet Take 1 tablet (500 mg total) by mouth 2 (two) times daily. 09/04/17  Yes Butch PennyMillikan, Megan, NP  meloxicam (MOBIC) 15 MG tablet  03/09/15  Yes [provider]  memantine (NAMENDA XR) 28 MG CP24 24 hr capsule Take 1 capsule (28 mg total) by mouth daily. 09/04/17  Yes Butch PennyMillikan, Megan, NP  NON FORMULARY Tumeric   Yes [provider]  polycarbophil (FIBERCON) 625 MG tablet Take 625 mg by mouth daily.   Yes [provider]  sertraline (ZOLOFT)  100 MG tablet  09/09/15  Yes [provider]  TURMERIC PO Take by mouth.   Yes [provider]  ZINC PICOLINATE PO Take 50 mg by mouth daily.   Yes [provider]    ROS:  Out of a complete 14 system review of symptoms, the patient complains only of the following symptoms, and all other reviewed systems are negative.  Incontinence of the bladder Back pain, walking difficulty Memory loss  Blood pressure (!) 96/59, pulse 89, height 5\' 2"  (1.575 m), weight 128 lb (58.1 kg).  Physical Exam  General: The patient is alert and cooperative at the time of the examination.  Skin: 2+ edema below the knees is seen.   Neurologic Exam  Mental status:  The patient is alert and oriented x 3 at the time of the examination. The Mini-Mental status examination done today shows a total score of 27/30.   Cranial nerves: Facial symmetry is present. Speech is normal, no aphasia or dysarthria is noted. Extraocular movements are full. Visual fields are full.  Motor: The patient has good strength in all 4 extremities.  Sensory examination: Soft touch sensation is symmetric on the face, arms, and legs.  Coordination: The patient has good finger-nose-finger and heel-to-shin bilaterally.  Gait and station: The patient has a slightly wide-based gait, the patient is stooped with walking.  She usually uses a quad cane for ambulation.  Tandem gait was not attempted.  Romberg is negative. No drift is seen.  Reflexes: Deep tendon reflexes are symmetric.   Assessment/Plan:  1.  Memory disturbance  2.  History of seizures, well controlled  3.  Gait disturbance  The patient appears to have relative stability with her cognitive functioning.  She does have underlying psychiatric disease.  The patient has had some issues with ambulation, she has undergone physical therapy recently.  She will remain on the Keppra and the Namenda, she will follow-up in 6 months.  Marlan Palau MD 03/05/2018 8:19 AM  [facility] [address] [place], [place] [address]  Phone [phone] Fax [phone]

## 2018-08-15 ENCOUNTER — Other Ambulatory Visit: Payer: Self-pay | Admitting: Adult Health

## 2018-09-05 ENCOUNTER — Encounter: Payer: Self-pay | Admitting: Adult Health

## 2018-09-05 ENCOUNTER — Other Ambulatory Visit: Payer: Self-pay | Admitting: *Deleted

## 2018-09-05 ENCOUNTER — Ambulatory Visit (INDEPENDENT_AMBULATORY_CARE_PROVIDER_SITE_OTHER): Payer: Medicare Other | Admitting: Adult Health

## 2018-09-05 VITALS — BP 130/85 | HR 84 | Ht 63.0 in | Wt 142.0 lb

## 2018-09-05 DIAGNOSIS — G40309 Generalized idiopathic epilepsy and epileptic syndromes, not intractable, without status epilepticus: Secondary | ICD-10-CM

## 2018-09-05 DIAGNOSIS — R413 Other amnesia: Secondary | ICD-10-CM | POA: Diagnosis not present

## 2018-09-05 MED ORDER — MEMANTINE HCL ER 28 MG PO CP24: 28.0000 mg | ORAL_CAPSULE | Freq: Every day | ORAL | 3 refills | Status: DC

## 2018-09-05 NOTE — Progress Notes (Signed)
PATIENT: Jaime Barry DOB: 12/07/1940  REASON FOR VISIT: follow up HISTORY FROM: patient  HISTORY OF PRESENT ILLNESS: Today 09/05/18:  Ms. Jaime Barry is a 77 year old female with a history of seizures and memory disturbance.  She returns today for follow-up.  She has not had any seizure events.  She continues on Keppra.  Patient feels that her memory has remained relatively stable.  She continues on Namenda.  She continues to live at home with her husband.  He is able to complete all ADLs independently.  She does not operate a motor vehicle.  She returns today for evaluation.  HISTORY Ms. Jaime Barry is a 77 year old right-handed white female with a history of well-controlled seizures, she has a memory disturbance and a gait disturbance.  The patient reports that she also has significant issues with urinary incontinence and she wears adult diapers for this.  The patient does not sleep well at nighttime, she generally will nap off and on during the day.  She has significant psychiatric disease and is followed by Dr. Donell BeersPlovsky for this.  The patient is on Namenda, she tolerates this fairly well and she takes Keppra for her seizures.  She is not on Aricept or Exelon secondary to the history of seizures.  The patient is not operating a motor vehicle.  She has not given up any activities of daily living since last seen.  She does crossword puzzles regularly.  She does keep up with her own medications but she needs help with her appointments.  The patient has been using a quad cane for ambulation, she does not use the cane inside the house, she will fall on occasion, the last fall was yesterday.  The patient has some low back pain, she is stooped with walking.  She did have physical therapy for gait training in early 2019.  The patient returns for an evaluation.  Overall, the memory issue has been relatively stable.  REVIEW OF SYSTEMS: Out of a complete 14 system review of symptoms, the patient complains only of  the following symptoms, and all other reviewed systems are negative.  See HPI  ALLERGIES: No Known Allergies  HOME MEDICATIONS: Outpatient Medications Prior to Visit  Medication Sig Dispense Refill  . Cholecalciferol (VITAMIN D3 PO) Take by mouth.    . denosumab (PROLIA) 60 MG/ML SOLN injection Inject 60 mg into the skin every 6 (six) months. Administer in upper arm, thigh, or abdomen    . divalproex (DEPAKOTE ER) 500 MG 24 hr tablet Take 500 mg by mouth 2 (two) times daily.     Marland Kitchen. levETIRAcetam (KEPPRA) 500 MG tablet TAKE 1 TABLET(500 MG) BY MOUTH TWICE DAILY 180 tablet 1  . meloxicam (MOBIC) 15 MG tablet     . memantine (NAMENDA XR) 28 MG CP24 24 hr capsule Take 1 capsule (28 mg total) by mouth daily. 90 capsule 3  . NON FORMULARY Tumeric    . polycarbophil (FIBERCON) 625 MG tablet Take 625 mg by mouth daily.    . sertraline (ZOLOFT) 100 MG tablet     . TURMERIC PO Take by mouth.    Marland Kitchen. ZINC PICOLINATE PO Take 50 mg by mouth daily.     No facility-administered medications prior to visit.     PAST MEDICAL HISTORY: Past Medical History:  Diagnosis Date  . Altered mental state 08/29/2012  . Dementia   . Dementia    progressive   . H/O cerebral aneurysm repair    in December of 2011  .  Lower leg edema   . Osteoporosis   . Seizure (HCC)   . Seizures (HCC) 08/29/2012  . Spinal stenosis     PAST SURGICAL HISTORY: Past Surgical History:  Procedure Laterality Date  . artery aneurysm clipping Right   . CEREBRAL ANEURYSM REPAIR  09/2010   paraophthalmic artery /note 08/29/2012  . LUMBAR PUNCTURE  08/29/2012  . TONSILLECTOMY      FAMILY HISTORY: Family History  Problem Relation Age of Onset  . Alzheimer's disease Mother   . Leukemia Father        Died in his early 27s    SOCIAL HISTORY: Social History   Socioeconomic History  . Marital status: Unknown    Spouse name: Not on file  . Number of children: 0  . Years of education: college  . Highest education level:  Not on file  Occupational History    Comment: Retired  Engineer, production  . Financial resource strain: Not on file  . Food insecurity:    Worry: Not on file    Inability: Not on file  . Transportation needs:    Medical: Not on file    Non-medical: Not on file  Tobacco Use  . Smoking status: Former Games developer  . Smokeless tobacco: Never Used  . Tobacco comment: Quit 1965  Substance and Sexual Activity  . Alcohol use: No    Alcohol/week: 1.0 standard drinks    Types: 1 Shots of liquor per week  . Drug use: No  . Sexual activity: Not on file  Lifestyle  . Physical activity:    Days per week: Not on file    Minutes per session: Not on file  . Stress: Not on file  Relationships  . Social connections:    Talks on phone: Not on file    Gets together: Not on file    Attends religious service: Not on file    Active member of club or organization: Not on file    Attends meetings of clubs or organizations: Not on file    Relationship status: Not on file  . Intimate partner violence:    Fear of current or ex partner: Not on file    Emotionally abused: Not on file    Physically abused: Not on file    Forced sexual activity: Not on file  Other Topics Concern  . Not on file  Social History Narrative   ** Merged History Encounter **       The patient is married and lives with her husband.    Patient has her masters   Patient has no children.    Patient is retired.      PHYSICAL EXAM  Vitals:   09/05/18 1032  BP: 130/85  Pulse: 84  Weight: 142 lb (64.4 kg)  Height: 5\' 3"  (1.6 m)   Body mass index is 25.15 kg/m.   MMSE - Mini Mental State Exam 09/05/2018 09/05/2018 03/05/2018  Not completed: (No Data) (No Data) -  Orientation to time 4 4 4   Orientation to Place 4 4 5   Registration 3 3 3   Attention/ Calculation 5 5 4   Recall 2 2 2   Language- name 2 objects 2 2 2   Language- repeat 1 1 1   Language- follow 3 step command 3 3 3   Language- read & follow direction 1 1 1   Write a  sentence 1 1 1   Copy design 1 1 1   Total score 27 27 27      Generalized: Well developed, in  no acute distress   Neurological examination  Mentation: Alert oriented to time, place, history taking. Follows all commands speech and language fluent Cranial nerve II-XII: Pupils were equal round reactive to light. Extraocular movements were full, visual field were full on confrontational test. Facial sensation and strength were normal. Uvula tongue midline. Head turning and shoulder shrug  were normal and symmetric. Motor: The motor testing reveals 5 over 5 strength of all 4 extremities. Good symmetric motor tone is noted throughout.  Sensory: Sensory testing is intact to soft touch on all 4 extremities. No evidence of extinction is noted.  Coordination: Cerebellar testing reveals good finger-nose-finger and heel-to-shin bilaterally.  Gait and station: Gait is normal.  Reflexes: Deep tendon reflexes are symmetric and normal bilaterally.   DIAGNOSTIC DATA (LABS, IMAGING, TESTING) - I reviewed patient records, labs, notes, testing and imaging myself where available.  Lab Results  Component Value Date   WBC 4.7 09/01/2012   HGB 11.7 (L) 09/01/2012   HCT 35.6 (L) 09/01/2012   MCV 99.4 09/01/2012   PLT 169 09/01/2012      Component Value Date/Time   NA 142 09/01/2012 0612   K 3.5 09/01/2012 0612   CL 109 09/01/2012 0612   CO2 27 09/01/2012 0612   GLUCOSE 87 09/01/2012 0612   BUN 7 09/01/2012 0612   CREATININE 0.63 09/01/2012 0612   CALCIUM 8.5 09/01/2012 0612   PROT 6.4 08/29/2012 0534   ALBUMIN 3.7 08/29/2012 0534   AST 18 08/29/2012 0534   ALT 14 08/29/2012 0534   ALKPHOS 48 08/29/2012 0534   BILITOT 0.3 08/29/2012 0534   GFRNONAA 89 (L) 09/01/2012 0612   GFRAA >90 09/01/2012 0612    Lab Results  Component Value Date   HGBA1C 5.9 (H) 08/29/2012   No results found for: [id number] Lab Results  Component Value Date   TSH 1.215 08/29/2012      ASSESSMENT AND PLAN 77  y.o. year old female  has a past medical history of Altered mental state (08/29/2012), Dementia, Dementia, H/O cerebral aneurysm repair, Lower leg edema, Osteoporosis, Seizure (HCC), Seizures (HCC) (08/29/2012), and Spinal stenosis. here with:  1.  Memory disturbance 2.  Seizures  The patient's memory score has remained stable.  She will continue on Namenda.  The patient seizures have been well controlled on Keppra.  She is advised that if she has any seizure events she should let us know.  We will follow-up in 6 months or sooner if needed.    Butch Penny, MSN, NP-C 09/05/2018, 9:02 AM [facility] [address], [address] [place], [place] [address] [phone]

## 2018-09-05 NOTE — Progress Notes (Signed)
I have read the note, and I agree with the clinical assessment and plan.  Charles K Willis   

## 2019-01-20 ENCOUNTER — Other Ambulatory Visit: Payer: Self-pay | Admitting: Neurology

## 2019-02-26 ENCOUNTER — Encounter: Payer: Self-pay | Admitting: Adult Health

## 2019-03-03 ENCOUNTER — Ambulatory Visit (INDEPENDENT_AMBULATORY_CARE_PROVIDER_SITE_OTHER): Payer: Medicare Other | Admitting: Adult Health

## 2019-03-03 ENCOUNTER — Encounter: Payer: Self-pay | Admitting: Adult Health

## 2019-03-03 ENCOUNTER — Other Ambulatory Visit: Payer: Self-pay

## 2019-03-03 DIAGNOSIS — R569 Unspecified convulsions: Secondary | ICD-10-CM

## 2019-03-03 DIAGNOSIS — W19XXXA Unspecified fall, initial encounter: Secondary | ICD-10-CM | POA: Diagnosis not present

## 2019-03-03 DIAGNOSIS — R413 Other amnesia: Secondary | ICD-10-CM | POA: Diagnosis not present

## 2019-03-03 NOTE — Progress Notes (Signed)
I have read the note, and I agree with the clinical assessment and plan.  Chanz Cahall K Marlyss Cissell   

## 2019-03-03 NOTE — Progress Notes (Signed)
PATIENT: Jaime Barry DOB: 04/18/1941  REASON FOR VISIT: follow up HISTORY FROM: patient  Virtual Visit via Video Note  I connected with Jaime Barry Codrington on 03/03/19 at  3:00 PM EDT by a video enabled telemedicine application [facility] and verified that I am speaking with the correct person using two identifiers who was located at their own home.   I discussed the limitations of evaluation and management by telemedicine and the availability of in person appointments. The patient expressed understanding and agreed to proceed.   PATIENT: Jaime Barry Nancarrow DOB: 04/09/1941  REASON FOR VISIT: follow up HISTORY FROM: patient  HISTORY OF PRESENT ILLNESS: Today 03/03/19:  Ms. Jaime Barry is a 78 year old female with a history of seizures and memory disturbance.  She returns today for a virtual visit her husband reports that her memory has remained stable.  She is able to complete all ADLs independently.  She states that she is moving slower.  She reports that she has had a fall at least daily for a week.  Reports that her last fall was yesterday.  She did see her primary care provider who diagnosed her with a urinary tract infection.  She was started on antibiotics.  She denies any seizure events.  Continues on Keppra.  Returns today for a virtual visit  HISTORY 09/05/18:  Ms. Jaime Barry is a 78 year old female with a history of seizures and memory disturbance.  She returns today for follow-up.  She has not had any seizure events.  She continues on Keppra.  Patient feels that her memory has remained relatively stable.  She continues on Namenda.  She continues to live at home with her husband.  He is able to complete all ADLs independently.  She does not operate a motor vehicle.  She returns today for evaluation.  REVIEW OF SYSTEMS: Out of a complete 14 system review of symptoms, the patient complains only of the following symptoms, and all other reviewed systems  are negative.  See HPI  ALLERGIES: No Known Allergies  HOME MEDICATIONS: Outpatient Medications Prior to Visit  Medication Sig Dispense Refill  . Cholecalciferol (VITAMIN D3 PO) Take by mouth.    . denosumab (PROLIA) 60 MG/ML SOLN injection Inject 60 mg into the skin every 6 (six) months. Administer in upper arm, thigh, or abdomen    . divalproex (DEPAKOTE ER) 500 MG 24 hr tablet Take 500 mg by mouth 2 (two) times daily.     Marland Kitchen. levETIRAcetam (KEPPRA) 500 MG tablet TAKE 1 TABLET(500 MG) BY MOUTH TWICE DAILY 180 tablet 1  . meloxicam (MOBIC) 7.5 MG tablet Take 7.5 mg by mouth daily.    . memantine (NAMENDA XR) 28 MG CP24 24 hr capsule Take 1 capsule (28 mg total) by mouth daily. 90 capsule 3  . MYRBETRIQ 50 MG TB24 tablet Take 50 mg by mouth daily.  5  . polycarbophil (FIBERCON) 625 MG tablet Take 625 mg by mouth daily.    . sertraline (ZOLOFT) 100 MG tablet Take 100 mg by mouth 2 (two) times daily.     . TURMERIC PO Take by mouth.    Marland Kitchen. ZINC PICOLINATE PO Take 50 mg by mouth daily.     No facility-administered medications prior to visit.     PAST MEDICAL HISTORY: Past Medical History:  Diagnosis Date  . Altered mental state 08/29/2012  . Dementia (HCC)   . Dementia (HCC)    progressive   . H/O cerebral aneurysm  repair    in December of 2011  . Lower leg edema   . Osteoporosis   . Seizure (HCC)   . Seizures (HCC) 08/29/2012  . Spinal stenosis     PAST SURGICAL HISTORY: Past Surgical History:  Procedure Laterality Date  . artery aneurysm clipping Right   . CEREBRAL ANEURYSM REPAIR  09/2010   paraophthalmic artery /note 08/29/2012  . LUMBAR PUNCTURE  08/29/2012  . TONSILLECTOMY      FAMILY HISTORY: Family History  Problem Relation Age of Onset  . Alzheimer'Barry disease Mother   . Leukemia Father        Died in his early 71s    SOCIAL HISTORY: Social History   Socioeconomic History  . Marital status: Unknown    Spouse name: Not on file  . Number of children: 0   . Years of education: college  . Highest education level: Not on file  Occupational History    Comment: Retired  Engineer, production  . Financial resource strain: Not on file  . Food insecurity:    Worry: Not on file    Inability: Not on file  . Transportation needs:    Medical: Not on file    Non-medical: Not on file  Tobacco Use  . Smoking status: Former Games developer  . Smokeless tobacco: Never Used  . Tobacco comment: Quit 1965  Substance and Sexual Activity  . Alcohol use: No    Alcohol/week: 1.0 standard drinks    Types: 1 Shots of liquor per week  . Drug use: No  . Sexual activity: Not on file  Lifestyle  . Physical activity:    Days per week: Not on file    Minutes per session: Not on file  . Stress: Not on file  Relationships  . Social connections:    Talks on phone: Not on file    Gets together: Not on file    Attends religious service: Not on file    Active member of club or organization: Not on file    Attends meetings of clubs or organizations: Not on file    Relationship status: Not on file  . Intimate partner violence:    Fear of current or ex partner: Not on file    Emotionally abused: Not on file    Physically abused: Not on file    Forced sexual activity: Not on file  Other Topics Concern  . Not on file  Social History Narrative   ** Merged History Encounter **       The patient is married and lives with her husband.    Patient has her masters   Patient has no children.    Patient is retired.      PHYSICAL EXAM  MMSE - Mini Mental State Exam 03/03/2019 09/05/2018 09/05/2018  Not completed: - (No Data) (No Data)  Orientation to time Orientation to Place Registration Attention/ Calculation Recall Language- name 2 objects Language- repeat Language- follow 3 step command Language- read & follow direction Write a sentence Copy design 0 1 1  Total score Generalized: Well  developed, in no acute distress   Neurological examination  Mentation: Alert oriented to time, place, history taking. Follows all commands speech and language fluent Cranial  nerve II-XII:Extraocular movements were full. Facial symmetry noted. uvula tongue midline. Head turning and shoulder shrug  were normal and symmetric. Motor: Good strength throughout subjectively per patient Sensory: Sensory testing is intact to soft touch on all 4 extremities subjectively per patient Coordination: Cerebellar testing reveals good finger-nose-finger  Gait and station: Patient has a slightly stooped gait.  Gait is slow and cautious.  She pushes from the chair in order to stand up. Reflexes: UTA  DIAGNOSTIC DATA (LABS, IMAGING, TESTING) - I reviewed patient records, labs, notes, testing and imaging myself where available.  Lab Results  Component Value Date   WBC 4.7 09/01/2012   HGB 11.7 (L) 09/01/2012   HCT 35.6 (L) 09/01/2012   MCV 99.4 09/01/2012   PLT 169 09/01/2012      Component Value Date/Time   NA 142 09/01/2012 0612   K 3.5 09/01/2012 0612   CL 109 09/01/2012 0612   CO2 27 09/01/2012 0612   GLUCOSE 87 09/01/2012 0612   BUN 7 09/01/2012 0612   CREATININE 0.63 09/01/2012 0612   CALCIUM 8.5 09/01/2012 0612   PROT 6.4 08/29/2012 0534   ALBUMIN 3.7 08/29/2012 0534   AST 18 08/29/2012 0534   ALT 14 08/29/2012 0534   ALKPHOS 48 08/29/2012 0534   BILITOT 0.3 08/29/2012 0534   GFRNONAA 89 (L) 09/01/2012 0612   GFRAA >90 09/01/2012 0612   No results found for: CHOL, HDL, LDLCALC, LDLDIRECT, TRIG, CHOLHDL Lab Results  Component Value Date   HGBA1C 5.9 (H) 08/29/2012   No results found for: VITAMINB12 Lab Results  Component Value Date   TSH 1.215 08/29/2012      ASSESSMENT AND PLAN 78 y.o. year old female  has a past medical history of Altered mental state (08/29/2012), Dementia (HCC), Dementia (HCC), H/O cerebral aneurysm repair, Lower leg edema, Osteoporosis, Seizure (HCC),  Seizures (HCC) (08/29/2012), and Spinal stenosis. here with:  1.  Seizures 2.  Memory disturbance 3. falls  The patient will continue on Keppra 500 mg twice a day.  The patients memory score has declined slightly since last visit however the memory test was done through a virtual visit which could affect scoring.  The patient was recently diagnosed with a urinary tract infection.  This could be the reason for falls.  I offered to refer to physical therapy for gait and balance training however her husband deferred for now.  He would like to see if the falling gets better once her urinary tract infection has resolved.  I have advised that if her symptoms worsen or she develops new symptoms she should let us know.  She will follow-up in 6 months or sooner if needed.   I spent 25 minutes with the patient this time was spent completing memory exam discussing symptoms and plan of care.   Butch Penny, MSN, NP-C 03/03/2019, 2:50 PM [facility] [address] [place], [place] [address] [phone]

## 2019-06-13 ENCOUNTER — Other Ambulatory Visit: Payer: Self-pay | Admitting: Neurology

## 2019-09-04 ENCOUNTER — Other Ambulatory Visit: Payer: Self-pay | Admitting: Adult Health

## 2019-09-08 ENCOUNTER — Other Ambulatory Visit: Payer: Self-pay

## 2019-09-08 ENCOUNTER — Ambulatory Visit: Payer: Medicare Other | Admitting: Adult Health

## 2019-09-08 ENCOUNTER — Encounter: Payer: Self-pay | Admitting: Adult Health

## 2019-09-08 VITALS — BP 130/78 | HR 93 | Temp 97.0°F | Ht 61.0 in | Wt 139.6 lb

## 2019-09-08 DIAGNOSIS — R413 Other amnesia: Secondary | ICD-10-CM

## 2019-09-08 DIAGNOSIS — R569 Unspecified convulsions: Secondary | ICD-10-CM | POA: Diagnosis not present

## 2019-09-08 NOTE — Progress Notes (Signed)
PATIENT: Jaime Barry DOB: 11/09/1940  REASON FOR VISIT: follow up HISTORY FROM: patient  HISTORY OF PRESENT ILLNESS: Today 09/08/19: Ms. Jaime Barry is a 78 year old female with a history of seizures and memory disturbance.  She returns today for follow-up.  She feels that her memory has remained the same.  She is able to complete all ADLs independently.  She no longer does any cooking.  Her husband manages the finances.  She denies any seizure events.  She remains on Keppra for seizures and Namenda for her memory.  She returns today for an evaluation.  HISTORY 03/03/19:  Ms. Jaime Barry is a 78 year old female with a history of seizures and memory disturbance.  She returns today for a virtual visit her husband reports that her memory has remained stable.  She is able to complete all ADLs independently.  She states that she is moving slower.  She reports that she has had a fall at least daily for a week.  Reports that her last fall was yesterday.  She did see her primary care provider who diagnosed her with a urinary tract infection.  She was started on antibiotics.  She denies any seizure events.  Continues on Keppra.  Returns today for a virtual visit  REVIEW OF SYSTEMS: Out of a complete 14 system review of symptoms, the patient complains only of the following symptoms, and all other reviewed systems are negative.  See HPI  ALLERGIES: No Known Allergies  HOME MEDICATIONS: Outpatient Medications Prior to Visit  Medication Sig Dispense Refill  . Cholecalciferol (VITAMIN D3 PO) Take by mouth.    . denosumab (PROLIA) 60 MG/ML SOLN injection Inject 60 mg into the skin every 6 (six) months. Administer in upper arm, thigh, or abdomen    . divalproex (DEPAKOTE ER) 500 MG 24 hr tablet Take 500 mg by mouth 2 (two) times daily.     Marland Kitchen. levETIRAcetam (KEPPRA) 500 MG tablet TAKE 1 TABLET(500 MG) BY MOUTH TWICE DAILY 180 tablet 1  . memantine (NAMENDA XR) 28 MG CP24 24 hr capsule Take 1 capsule (28 mg  total) by mouth daily. Must keep upcoming appointment for further refills. 30 capsule 0  . polycarbophil (FIBERCON) 625 MG tablet Take 625 mg by mouth daily.    . sertraline (ZOLOFT) 100 MG tablet Take 100 mg by mouth 2 (two) times daily.     . TURMERIC PO Take by mouth.    Marland Kitchen. ZINC PICOLINATE PO Take 50 mg by mouth daily.    . meloxicam (MOBIC) 7.5 MG tablet Take 7.5 mg by mouth daily.    Marland Kitchen. MYRBETRIQ 50 MG TB24 tablet Take 50 mg by mouth daily.  5   No facility-administered medications prior to visit.     PAST MEDICAL HISTORY: Past Medical History:  Diagnosis Date  . Altered mental state 08/29/2012  . Dementia (HCC)   . Dementia (HCC)    progressive   . H/O cerebral aneurysm repair    in December of 2011  . Lower leg edema   . Osteoporosis   . Seizure (HCC)   . Seizures (HCC) 08/29/2012  . Spinal stenosis     PAST SURGICAL HISTORY: Past Surgical History:  Procedure Laterality Date  . artery aneurysm clipping Right   . CEREBRAL ANEURYSM REPAIR  09/2010   paraophthalmic artery /note 08/29/2012  . LUMBAR PUNCTURE  08/29/2012  . TONSILLECTOMY      FAMILY HISTORY: Family History  Problem Relation Age of Onset  . Alzheimer's disease Mother   .  Leukemia Father        Died in his early 20s    SOCIAL HISTORY: Social History   Socioeconomic History  . Marital status: Unknown    Spouse name: Not on file  . Number of children: 0  . Years of education: college  . Highest education level: Not on file  Occupational History    Comment: Retired  Engineer, production  . Financial resource strain: Not on file  . Food insecurity    Worry: Not on file    Inability: Not on file  . Transportation needs    Medical: Not on file    Non-medical: Not on file  Tobacco Use  . Smoking status: Former Games developer  . Smokeless tobacco: Never Used  . Tobacco comment: Quit 1965  Substance and Sexual Activity  . Alcohol use: No    Alcohol/week: 1.0 standard drinks    Types: 1 Shots of liquor per  week  . Drug use: No  . Sexual activity: Not on file  Lifestyle  . Physical activity    Days per week: Not on file    Minutes per session: Not on file  . Stress: Not on file  Relationships  . Social Musician on phone: Not on file    Gets together: Not on file    Attends religious service: Not on file    Active member of club or organization: Not on file    Attends meetings of clubs or organizations: Not on file    Relationship status: Not on file  . Intimate partner violence    Fear of current or ex partner: Not on file    Emotionally abused: Not on file    Physically abused: Not on file    Forced sexual activity: Not on file  Other Topics Concern  . Not on file  Social History Narrative   ** Merged History Encounter **       The patient is married and lives with her husband.    Patient has her masters   Patient has no children.    Patient is retired.      PHYSICAL EXAM  Vitals:   09/08/19 1010  BP: 130/78  Pulse: 93  Temp: (!) 97 F (36.1 C)  Weight: 139 lb 9.6 oz (63.3 kg)  Height: 5\' 1"  (1.549 m)   Body mass index is 26.38 kg/m.   MMSE - Mini Mental State Exam 09/08/2019 03/03/2019 09/05/2018  Not completed: - - (No Data)  Orientation to time 5 5 4   Orientation to Place 5 5 4   Registration 3 3 3   Attention/ Calculation 2 2 5   Recall 3 2 2   Language- name 2 objects 2 2 2   Language- repeat 1 1 1   Language- follow 3 step command 3 2 3   Language- read & follow direction 1 1 1   Write a sentence 1 1 1   Copy design 1 0 1  Copy design-comments named 4 animals - -  Total score 27 24 27      Generalized: Well developed, in no acute distress   Neurological examination  Mentation: Alert oriented to time, place, history taking. Follows all commands speech and language fluent Cranial nerve II-XII: Pupils were equal round reactive to light. Extraocular movements were full, visual field were full on confrontational test. e. Head turning and shoulder  shrug  were normal and symmetric. Motor: The motor testing reveals 5 over 5 strength of all 4 extremities. Good symmetric motor tone  is noted throughout.  Sensory: Sensory testing is intact to soft touch on all 4 extremities. No evidence of extinction is noted.  Coordination: Cerebellar testing reveals good finger-nose-finger and heel-to-shin bilaterally.  Gait and station: Gait is normal.  Reflexes: Deep tendon reflexes are symmetric and normal bilaterally.   DIAGNOSTIC DATA (LABS, IMAGING, TESTING) - I reviewed patient records, labs, notes, testing and imaging myself where available.  Lab Results  Component Value Date   WBC 4.7 09/01/2012   HGB 11.7 (L) 09/01/2012   HCT 35.6 (L) 09/01/2012   MCV 99.4 09/01/2012   PLT 169 09/01/2012      Component Value Date/Time   NA 142 09/01/2012 0612   K 3.5 09/01/2012 0612   CL 109 09/01/2012 0612   CO2 27 09/01/2012 0612   GLUCOSE 87 09/01/2012 0612   BUN 7 09/01/2012 0612   CREATININE 0.63 09/01/2012 0612   CALCIUM 8.5 09/01/2012 0612   PROT 6.4 08/29/2012 0534   ALBUMIN 3.7 08/29/2012 0534   AST 18 08/29/2012 0534   ALT 14 08/29/2012 0534   ALKPHOS 48 08/29/2012 0534   BILITOT 0.3 08/29/2012 0534   GFRNONAA 89 (L) 09/01/2012 0612   GFRAA >90 09/01/2012 0612   No results found for: CHOL, HDL, LDLCALC, LDLDIRECT, TRIG, CHOLHDL Lab Results  Component Value Date   HGBA1C 5.9 (H) 08/29/2012   No results found for: VITAMINB12 Lab Results  Component Value Date   TSH 1.215 08/29/2012      ASSESSMENT AND PLAN 78 y.o. year old female  has a past medical history of Altered mental state (08/29/2012), Dementia ([place]), Dementia ([place]), H/O cerebral aneurysm repair, Lower leg edema, Osteoporosis, Seizure ([place]), Seizures ([place]) (08/29/2012), and Spinal stenosis. here with:  1.  Memory disturbance  -Memory score is stable MMSE 27 out of 30 -Continue Namenda XR 28 mg  2.  Seizures  -Stable no seizure events -Continue Keppra 500 mg  twice a day  Overall the patient is doing well.  Advised that if her symptoms worsen or she develops new symptoms she should let us know.  She will follow-up in 1 year or sooner if needed.  I spent 15 minutes with the patient. 50% of this time was spent   Ward Givens, MSN, NP-C 09/08/2019, 10:43 AM [facility] [address], [place], [place] [address] [phone]

## 2019-09-08 NOTE — Progress Notes (Signed)
I have read the note, and I agree with the clinical assessment and plan.  Latissa Frick K Kellyanne Ellwanger   

## 2019-09-08 NOTE — Patient Instructions (Signed)
Your Plan:  Continue Keppra for seizures Continue Namenda for memory Memory score is stable If your symptoms worsen or you develop new symptoms please let us know.   Thank you for coming to see [place] at [facility]. I hope we have been able to provide you high quality care today.  You may receive a patient satisfaction survey over the next few weeks. We would appreciate your feedback and comments so that we may continue to improve ourselves and the health of our patients.

## 2019-10-06 ENCOUNTER — Other Ambulatory Visit: Payer: Self-pay | Admitting: *Deleted

## 2019-10-06 MED ORDER — MEMANTINE HCL ER 28 MG PO CP24: 28.0000 mg | ORAL_CAPSULE | Freq: Every day | ORAL | 11 refills | Status: DC

## 2019-10-11 ENCOUNTER — Ambulatory Visit: Payer: Medicare PPO | Attending: Internal Medicine

## 2019-10-11 DIAGNOSIS — Z23 Encounter for immunization: Secondary | ICD-10-CM

## 2019-10-11 NOTE — Progress Notes (Signed)
   Covid-19 Vaccination Clinic  Name:  Jaime Barry    MRN: [id number] DOB: 29-Jun-1941  10/11/2019  Jaime Barry was observed post Covid-19 immunization for 15 minutes without incidence. She was provided with Vaccine Information Sheet and instruction to access the V-Safe system.   Jaime Barry was instructed to call 911 with any severe reactions post vaccine: Marland Kitchen Difficulty breathing  . Swelling of your face and throat  . A fast heartbeat  . A bad rash all over your body  . Dizziness and weakness    Immunizations Administered    Name Date Dose VIS Date Route   Pfizer COVID-19 Vaccine 10/11/2019 11:40 AM 0.3 mL 09/12/2019 Intramuscular   Manufacturer: [employer], [employer]   Lot: [id number]   NDC: [address]-2

## 2019-11-01 ENCOUNTER — Ambulatory Visit: Payer: BC Managed Care – PPO | Attending: Internal Medicine

## 2019-11-01 ENCOUNTER — Ambulatory Visit: Payer: BC Managed Care – PPO

## 2019-11-01 DIAGNOSIS — Z23 Encounter for immunization: Secondary | ICD-10-CM | POA: Insufficient documentation

## 2019-11-01 NOTE — Progress Notes (Signed)
   Covid-19 Vaccination Clinic  Name:  LARK LANGENFELD    MRN: [id number] DOB: 1941/04/03  11/01/2019  [name] was observed post Covid-19 immunization for 15 minutes without incidence. She was provided with Vaccine Information Sheet and instruction to access the V-Safe system.   [name] was instructed to call 911 with any severe reactions post vaccine: Marland Kitchen Difficulty breathing  . Swelling of your face and throat  . A fast heartbeat  . A bad rash all over your body  . Dizziness and weakness    Immunizations Administered    Name Date Dose VIS Date Route   Pfizer COVID-19 Vaccine 11/01/2019 12:49 PM 0.3 mL 09/12/2019 Intramuscular   Manufacturer: [employer], [employer]   Lot: [id number]   NDC: [address]-4

## 2020-01-29 ENCOUNTER — Other Ambulatory Visit: Payer: Self-pay | Admitting: Adult Health

## 2020-07-29 ENCOUNTER — Other Ambulatory Visit: Payer: Self-pay | Admitting: Adult Health

## 2020-09-03 DIAGNOSIS — Z79899 Other long term (current) drug therapy: Secondary | ICD-10-CM | POA: Diagnosis not present

## 2020-09-07 DIAGNOSIS — F3181 Bipolar II disorder: Secondary | ICD-10-CM | POA: Diagnosis not present

## 2020-10-06 DIAGNOSIS — Z85828 Personal history of other malignant neoplasm of skin: Secondary | ICD-10-CM | POA: Diagnosis not present

## 2020-10-06 DIAGNOSIS — L720 Epidermal cyst: Secondary | ICD-10-CM | POA: Diagnosis not present

## 2020-10-06 DIAGNOSIS — L821 Other seborrheic keratosis: Secondary | ICD-10-CM | POA: Diagnosis not present

## 2020-11-10 DIAGNOSIS — F3181 Bipolar II disorder: Secondary | ICD-10-CM | POA: Diagnosis not present

## 2020-11-24 DIAGNOSIS — Z79899 Other long term (current) drug therapy: Secondary | ICD-10-CM | POA: Diagnosis not present

## 2020-12-15 ENCOUNTER — Ambulatory Visit: Payer: Medicare PPO | Admitting: Adult Health

## 2020-12-15 VITALS — BP 96/53 | HR 84 | Ht 61.0 in | Wt 126.0 lb

## 2020-12-15 DIAGNOSIS — R569 Unspecified convulsions: Secondary | ICD-10-CM | POA: Diagnosis not present

## 2020-12-15 DIAGNOSIS — R413 Other amnesia: Secondary | ICD-10-CM

## 2020-12-15 MED ORDER — MEMANTINE HCL ER 7 MG PO CP24: 7.0000 mg | ORAL_CAPSULE | Freq: Every day | ORAL | 11 refills | Status: DC

## 2020-12-15 NOTE — Progress Notes (Signed)
PATIENT: Jaime Barry DOB: 06/25/1941  REASON FOR VISIT: follow up HISTORY FROM: patient  HISTORY OF PRESENT ILLNESS: Today 12/15/20: Jaime Barry is a 80 year old female with a history of seizures and memory disturbance.  She returns today for follow-up.  She is here with her husband.  Overall she feels that she has remained stable.  Husband states that a while back she stopped Keppra and the Namenda.  She felt that the medication was making her feel better.  Patient states that she did feel better initially however now she is not sure if it was due to the medication.  She remains on Depakote prescribed by Dr. Donell Beers.  She denies any seizure events.  Husband is noticing changes in her memory.  She is able to complete all ADLs independently.  She does not operate a motor vehicle.   09/08/19: Jaime Barry is a 80 year old female with a history of seizures and memory disturbance.  She returns today for follow-up.  She feels that her memory has remained the same.  She is able to complete all ADLs independently.  She no longer does any cooking.  Her husband manages the finances.  She denies any seizure events.  She remains on Keppra for seizures and Namenda for her memory.  She returns today for an evaluation.  HISTORY 03/03/19:  Jaime Barry is a 81 year old female with a history of seizures and memory disturbance.  She returns today for a virtual visit her husband reports that her memory has remained stable.  She is able to complete all ADLs independently.  She states that she is moving slower.  She reports that she has had a fall at least daily for a week.  Reports that her last fall was yesterday.  She did see her primary care provider who diagnosed her with a urinary tract infection.  She was started on antibiotics.  She denies any seizure events.  Continues on Keppra.  Returns today for a virtual visit  REVIEW OF SYSTEMS: Out of a complete 14 system review of symptoms, the patient complains only  of the following symptoms, and all other reviewed systems are negative.  See HPI  ALLERGIES: No Known Allergies  HOME MEDICATIONS: Outpatient Medications Prior to Visit  Medication Sig Dispense Refill  . denosumab (PROLIA) 60 MG/ML SOLN injection Inject 60 mg into the skin every 6 (six) months. Administer in upper arm, thigh, or abdomen    . divalproex (DEPAKOTE ER) 500 MG 24 hr tablet Take 500 mg by mouth 2 (two) times daily.     . polycarbophil (FIBERCON) 625 MG tablet Take 625 mg by mouth daily.    Marland Kitchen ZINC PICOLINATE PO Take 50 mg by mouth daily.    . Cholecalciferol (VITAMIN D3 PO) Take by mouth.    . levETIRAcetam (KEPPRA) 500 MG tablet TAKE 1 TABLET(500 MG) BY MOUTH TWICE DAILY 180 tablet 1  . memantine (NAMENDA XR) 28 MG CP24 24 hr capsule Take 1 capsule (28 mg total) by mouth daily. Must keep upcoming appointment for further refills. 30 capsule 11  . sertraline (ZOLOFT) 100 MG tablet Take 100 mg by mouth 2 (two) times daily.     . TURMERIC PO Take by mouth.     No facility-administered medications prior to visit.    PAST MEDICAL HISTORY: Past Medical History:  Diagnosis Date  . Altered mental state 08/29/2012  . Dementia (HCC)   . Dementia (HCC)    progressive   . H/O cerebral aneurysm repair  in December of 2011  . Lower leg edema   . Osteoporosis   . Seizure (HCC)   . Seizures (HCC) 08/29/2012  . Spinal stenosis     PAST SURGICAL HISTORY: Past Surgical History:  Procedure Laterality Date  . artery aneurysm clipping Right   . CEREBRAL ANEURYSM REPAIR  09/2010   paraophthalmic artery /note 08/29/2012  . LUMBAR PUNCTURE  08/29/2012  . TONSILLECTOMY      FAMILY HISTORY: Family History  Problem Relation Age of Onset  . Alzheimer's disease Mother   . Leukemia Father        Died in his early 67s    SOCIAL HISTORY: Social History   Socioeconomic History  . Marital status: Married    Spouse name: Not on file  . Number of children: 0  . Years of  education: college  . Highest education level: Not on file  Occupational History    Comment: Retired  Tobacco Use  . Smoking status: Former Games developer  . Smokeless tobacco: Never Used  . Tobacco comment: Quit 1965  Substance and Sexual Activity  . Alcohol use: No    Alcohol/week: 1.0 standard drink    Types: 1 Shots of liquor per week  . Drug use: No  . Sexual activity: Not on file  Other Topics Concern  . Not on file  Social History Narrative   ** Merged History Encounter **       The patient is married and lives with her husband.    Patient has her masters   Patient has no children.    Patient is retired.   Social Determinants of Health   Financial Resource Strain: Not on file  Food Insecurity: Not on file  Transportation Needs: Not on file  Physical Activity: Not on file  Stress: Not on file  Social Connections: Not on file  Intimate Partner Violence: Not on file      PHYSICAL EXAM  Vitals:   12/15/20 0944  BP: (!) 96/53  Pulse: 84  Weight: 126 lb (57.2 kg)  Height: 5\' 1"  (1.549 m)   Body mass index is 23.81 kg/m.   MMSE - Mini Mental State Exam 12/15/2020 09/08/2019 03/03/2019  Not completed: - - -  Orientation to time 5 5 5   Orientation to Place 5 5 5   Registration 3 3 3   Attention/ Calculation 1 2 2   Recall 3 3 2   Language- name 2 objects 2 2 2   Language- repeat 1 1 1   Language- follow 3 step command 3 3 2   Language- read & follow direction 1 1 1   Write a sentence 1 1 1   Copy design - 1 0  Copy design-comments - named 4 animals -  Total score - 27 24     Generalized: Well developed, in no acute distress   Neurological examination  Mentation: Alert oriented to time, place, history taking. Follows all commands speech and language fluent Cranial nerve II-XII: Pupils were equal round reactive to light. Extraocular movements were full, visual field were full on confrontational test. e. Head turning and shoulder shrug  were normal and symmetric. Motor:  The motor testing reveals 5 over 5 strength of all 4 extremities. Good symmetric motor tone is noted throughout.  Sensory: Sensory testing is intact to soft touch on all 4 extremities. No evidence of extinction is noted.  Coordination: Cerebellar testing reveals good finger-nose-finger and heel-to-shin bilaterally.  Gait and station: Gait is normal.  Reflexes: Deep tendon reflexes are symmetric and normal  bilaterally.   DIAGNOSTIC DATA (LABS, IMAGING, TESTING) - I reviewed patient records, labs, notes, testing and imaging myself where available.  Lab Results  Component Value Date   WBC 4.7 09/01/2012   HGB 11.7 (L) 09/01/2012   HCT 35.6 (L) 09/01/2012   MCV 99.4 09/01/2012   PLT 169 09/01/2012      Component Value Date/Time   NA 142 09/01/2012 0612   K 3.5 09/01/2012 0612   CL 109 09/01/2012 0612   CO2 27 09/01/2012 0612   GLUCOSE 87 09/01/2012 0612   BUN 7 09/01/2012 0612   CREATININE 0.63 09/01/2012 0612   CALCIUM 8.5 09/01/2012 0612   PROT 6.4 08/29/2012 0534   ALBUMIN 3.7 08/29/2012 0534   AST 18 08/29/2012 0534   ALT 14 08/29/2012 0534   ALKPHOS 48 08/29/2012 0534   BILITOT 0.3 08/29/2012 0534   GFRNONAA 89 (L) 09/01/2012 0612   GFRAA >90 09/01/2012 0612   No results found for: CHOL, HDL, LDLCALC, LDLDIRECT, TRIG, CHOLHDL Lab Results  Component Value Date   HGBA1C 5.9 (H) 08/29/2012   No results found for: VITAMINB12 Lab Results  Component Value Date   TSH 1.215 08/29/2012      ASSESSMENT AND PLAN 80 y.o. year old female  has a past medical history of Altered mental state (08/29/2012), Dementia (HCC), Dementia (HCC), H/O cerebral aneurysm repair, Lower leg edema, Osteoporosis, Seizure (HCC), Seizures (HCC) (08/29/2012), and Spinal stenosis. here with:  1.  Memory disturbance  -Memory score  MMSE 25 out of 30 -Restart Namenda XR 7 mg daily--we will try a lower dose to see if she tolerates it better.  Can continue to increase the dose if she does well  2.   Seizures  -Stable no seizure events -No longer on Keppra -Remains on Depakote 500 mg twice a day prescribed by Dr. Donell Beers  Overall the patient is doing well.  Advised that if her symptoms worsen or she develops new symptoms she should let us know.  She will follow-up in 6 months or sooner if needed  I spent 30 minutes of face-to-face and non-face-to-face time with patient.  This included previsit chart review, lab review, study review, order entry, electronic health record documentation, patient education.    Butch Penny, MSN, NP-C 12/15/2020, 9:54 AM [facility] [address], [address] [place], [place] [address] [phone]

## 2020-12-15 NOTE — Progress Notes (Signed)
I have read the note, and I agree with the clinical assessment and plan.  Oval Cavazos K Johniece Hornbaker   

## 2020-12-15 NOTE — Patient Instructions (Addendum)
Your Plan:  Continue to monitor memory Restart Namenda XR 7 mg  Continue follow up with Dr. Donell Beers If your symptoms worsen or you develop new symptoms please let us know.   Thank you for coming to see [place] at [facility]. I hope we have been able to provide you high quality care today.  You may receive a patient satisfaction survey over the next few weeks. We would appreciate your feedback and comments so that we may continue to improve ourselves and the health of our patients.

## 2021-01-04 DIAGNOSIS — M810 Age-related osteoporosis without current pathological fracture: Secondary | ICD-10-CM | POA: Diagnosis not present

## 2021-01-19 DIAGNOSIS — F3181 Bipolar II disorder: Secondary | ICD-10-CM | POA: Diagnosis not present

## 2021-05-10 DIAGNOSIS — D1801 Hemangioma of skin and subcutaneous tissue: Secondary | ICD-10-CM | POA: Diagnosis not present

## 2021-05-10 DIAGNOSIS — L853 Xerosis cutis: Secondary | ICD-10-CM | POA: Diagnosis not present

## 2021-05-10 DIAGNOSIS — L720 Epidermal cyst: Secondary | ICD-10-CM | POA: Diagnosis not present

## 2021-05-10 DIAGNOSIS — Z85828 Personal history of other malignant neoplasm of skin: Secondary | ICD-10-CM | POA: Diagnosis not present

## 2021-05-10 DIAGNOSIS — L821 Other seborrheic keratosis: Secondary | ICD-10-CM | POA: Diagnosis not present

## 2021-06-10 DIAGNOSIS — M810 Age-related osteoporosis without current pathological fracture: Secondary | ICD-10-CM | POA: Diagnosis not present

## 2021-06-10 DIAGNOSIS — Z79899 Other long term (current) drug therapy: Secondary | ICD-10-CM | POA: Diagnosis not present

## 2021-06-16 DIAGNOSIS — R5381 Other malaise: Secondary | ICD-10-CM | POA: Diagnosis not present

## 2021-06-16 DIAGNOSIS — M8580 Other specified disorders of bone density and structure, unspecified site: Secondary | ICD-10-CM | POA: Diagnosis not present

## 2021-06-16 DIAGNOSIS — M4800 Spinal stenosis, site unspecified: Secondary | ICD-10-CM | POA: Diagnosis not present

## 2021-06-16 DIAGNOSIS — L603 Nail dystrophy: Secondary | ICD-10-CM | POA: Diagnosis not present

## 2021-06-16 DIAGNOSIS — Z0000 Encounter for general adult medical examination without abnormal findings: Secondary | ICD-10-CM | POA: Diagnosis not present

## 2021-06-16 DIAGNOSIS — F0390 Unspecified dementia without behavioral disturbance: Secondary | ICD-10-CM | POA: Diagnosis not present

## 2021-06-16 DIAGNOSIS — R32 Unspecified urinary incontinence: Secondary | ICD-10-CM | POA: Diagnosis not present

## 2021-06-16 DIAGNOSIS — R159 Full incontinence of feces: Secondary | ICD-10-CM | POA: Diagnosis not present

## 2021-06-16 DIAGNOSIS — F918 Other conduct disorders: Secondary | ICD-10-CM | POA: Diagnosis not present

## 2021-06-23 ENCOUNTER — Encounter: Payer: Self-pay | Admitting: Adult Health

## 2021-06-23 ENCOUNTER — Ambulatory Visit: Payer: Medicare PPO | Admitting: Adult Health

## 2021-06-23 VITALS — BP 108/67 | HR 89 | Ht 61.0 in | Wt 123.8 lb

## 2021-06-23 DIAGNOSIS — R569 Unspecified convulsions: Secondary | ICD-10-CM | POA: Diagnosis not present

## 2021-06-23 DIAGNOSIS — R413 Other amnesia: Secondary | ICD-10-CM | POA: Diagnosis not present

## 2021-06-23 MED ORDER — MEMANTINE HCL ER 14 MG PO CP24: 14.0000 mg | ORAL_CAPSULE | Freq: Every day | ORAL | 5 refills | Status: DC

## 2021-06-23 NOTE — Progress Notes (Signed)
PATIENT: Jaime Barry DOB: 1941/03/24  REASON FOR VISIT: follow up HISTORY FROM: patient  HISTORY OF PRESENT ILLNESS: Today 06/23/21:  Jaime Barry is a 80 year old female with a history of seizures and memory disturbance.  She returns today for follow-up.  Seizures: Stable, continues on Depakote 500 mg twice a day.  Reports that she recently had a Depakote level checked with her PCP and her husband reports that it was not detectable but the patient states that she is taking the medication.  Memory: Patient currently on Namenda extended release 7 mg daily.  Reports that she is tolerating this well.  Denies any new symptoms.  She lives at home with her husband.  Able to complete all ADLs independently.  She does not operate a motor vehicle.  Returns today for an evaluation.  12/15/20: Jaime Barry is a 80 year old female with a history of seizures and memory disturbance.  She returns today for follow-up.  She is here with her husband.  Overall she feels that she has remained stable.  Husband states that a while back she stopped Keppra and the Namenda.  She felt that the medication was making her feel better.  Patient states that she did feel better initially however now she is not sure if it was due to the medication.  She remains on Depakote prescribed by Dr. Donell Beers.  She denies any seizure events.  Husband is noticing changes in her memory.  She is able to complete all ADLs independently.  She does not operate a motor vehicle.   09/08/19: Jaime Barry is a 80 year old female with a history of seizures and memory disturbance.  She returns today for follow-up.  She feels that her memory has remained the same.  She is able to complete all ADLs independently.  She no longer does any cooking.  Her husband manages the finances.  She denies any seizure events.  She remains on Keppra for seizures and Namenda for her memory.  She returns today for an evaluation.  HISTORY 03/03/19:   Jaime Barry is a  80 year old female with a history of seizures and memory disturbance.  She returns today for a virtual visit her husband reports that her memory has remained stable.  She is able to complete all ADLs independently.  She states that she is moving slower.  She reports that she has had a fall at least daily for a week.  Reports that her last fall was yesterday.  She did see her primary care provider who diagnosed her with a urinary tract infection.  She was started on antibiotics.  She denies any seizure events.  Continues on Keppra.  Returns today for a virtual visit  REVIEW OF SYSTEMS: Out of a complete 14 system review of symptoms, the patient complains only of the following symptoms, and all other reviewed systems are negative.  See HPI  ALLERGIES: No Known Allergies  HOME MEDICATIONS: Outpatient Medications Prior to Visit  Medication Sig Dispense Refill   Calcium 600-200 MG-UNIT tablet Take 1 tablet by mouth daily.     denosumab (PROLIA) 60 MG/ML SOLN injection Inject 60 mg into the skin every 6 (six) months. Administer in upper arm, thigh, or abdomen     divalproex (DEPAKOTE ER) 500 MG 24 hr tablet Take 500 mg by mouth 2 (two) times daily.      memantine (NAMENDA XR) 7 MG CP24 24 hr capsule Take 1 capsule (7 mg total) by mouth daily. 30 capsule 11   polycarbophil (FIBERCON)  625 MG tablet Take 625 mg by mouth daily.     ZINC PICOLINATE PO Take 50 mg by mouth daily.     No facility-administered medications prior to visit.    PAST MEDICAL HISTORY: Past Medical History:  Diagnosis Date   Altered mental state 08/29/2012   Dementia (HCC)    Dementia (HCC)    progressive    H/O cerebral aneurysm repair    in December of 2011   Lower leg edema    Osteoporosis    Seizure (HCC)    Seizures (HCC) 08/29/2012   Spinal stenosis     PAST SURGICAL HISTORY: Past Surgical History:  Procedure Laterality Date   artery aneurysm clipping Right    CEREBRAL ANEURYSM REPAIR  09/2010    paraophthalmic artery /note 08/29/2012   LUMBAR PUNCTURE  08/29/2012   TONSILLECTOMY      FAMILY HISTORY: Family History  Problem Relation Age of Onset   Alzheimer's disease Mother    Leukemia Father        Died in his early 76s    SOCIAL HISTORY: Social History   Socioeconomic History   Marital status: Married    Spouse name: Not on file   Number of children: 0   Years of education: college   Highest education level: Not on file  Occupational History    Comment: Retired  Tobacco Use   Smoking status: Former   Smokeless tobacco: Never   Tobacco comments:    Quit 1965  Substance and Sexual Activity   Alcohol use: No    Alcohol/week: 1.0 standard drink    Types: 1 Shots of liquor per week   Drug use: No   Sexual activity: Not on file  Other Topics Concern   Not on file  Social History Narrative   ** Merged History Encounter **       The patient is married and lives with her husband.    Patient has her masters   Patient has no children.    Patient is retired.   Social Determinants of Health   Financial Resource Strain: Not on file  Food Insecurity: Not on file  Transportation Needs: Not on file  Physical Activity: Not on file  Stress: Not on file  Social Connections: Not on file  Intimate Partner Violence: Not on file      PHYSICAL EXAM  Vitals:   06/23/21 0748  BP: 108/67  Pulse: 89  Weight: 123 lb 12.8 oz (56.2 kg)  Height: 5\' 1"  (1.549 m)   Body mass index is 23.39 kg/m.   MMSE - Mini Mental State Exam 06/23/2021 12/15/2020 09/08/2019  Not completed: - - -  Orientation to time 5 5 5   Orientation to Place 4 5 5   Registration 3 3 3   Attention/ Calculation 5 1 2   Recall 3 3 3   Language- name 2 objects 2 2 2   Language- repeat 1 1 1   Language- follow 3 step command 2 3 3   Language- read & follow direction 1 1 1   Write a sentence 1 1 1   Copy design 1 - 1  Copy design-comments - - named 4 animals  Total score 28 - 27     Generalized: Well  developed, in no acute distress   Neurological examination  Mentation: Alert oriented to time, place, history taking. Follows all commands speech and language fluent Cranial nerve II-XII: Pupils were equal round reactive to light. Extraocular movements were full, visual field were full on confrontational test. e.  Head turning and shoulder shrug  were normal and symmetric. Motor: The motor testing reveals 5 over 5 strength of all 4 extremities. Good symmetric motor tone is noted throughout.  Sensory: Sensory testing is intact to soft touch on all 4 extremities. No evidence of extinction is noted.  Coordination: Cerebellar testing reveals good finger-nose-finger and heel-to-shin bilaterally.  Gait and station: Gait is normal.  Reflexes: Deep tendon reflexes are symmetric and normal bilaterally.   DIAGNOSTIC DATA (LABS, IMAGING, TESTING) - I reviewed patient records, labs, notes, testing and imaging myself where available.  Lab Results  Component Value Date   WBC 4.7 09/01/2012   HGB 11.7 (L) 09/01/2012   HCT 35.6 (L) 09/01/2012   MCV 99.4 09/01/2012   PLT 169 09/01/2012      Component Value Date/Time   NA 142 09/01/2012 0612   K 3.5 09/01/2012 0612   CL 109 09/01/2012 0612   CO2 27 09/01/2012 0612   GLUCOSE 87 09/01/2012 0612   BUN 7 09/01/2012 0612   CREATININE 0.63 09/01/2012 0612   CALCIUM 8.5 09/01/2012 0612   PROT 6.4 08/29/2012 0534   ALBUMIN 3.7 08/29/2012 0534   AST 18 08/29/2012 0534   ALT 14 08/29/2012 0534   ALKPHOS 48 08/29/2012 0534   BILITOT 0.3 08/29/2012 0534   GFRNONAA 89 (L) 09/01/2012 0612   GFRAA >90 09/01/2012 0612   No results found for: CHOL, HDL, LDLCALC, LDLDIRECT, TRIG, CHOLHDL Lab Results  Component Value Date   HGBA1C 5.9 (H) 08/29/2012   No results found for: VITAMINB12 Lab Results  Component Value Date   TSH 1.215 08/29/2012      ASSESSMENT AND PLAN 80 y.o. year old female  has a past medical history of Altered mental state  (08/29/2012), Dementia (HCC), Dementia (HCC), H/O cerebral aneurysm repair, Lower leg edema, Osteoporosis, Seizure (HCC), Seizures (HCC) (08/29/2012), and Spinal stenosis. here with:  1.  Memory disturbance  -Memory score  MMSE 28 out of 30 -Increase Namenda XR 14 mg daily  2.  Seizures  -Stable no seizure events -Remains on Depakote 500 mg twice a day  -We will check a Depakote level today      Butch Penny, MSN, NP-C 06/23/2021, 8:06 AM [facility] [address], [address] [place], [place] [address] [phone]

## 2021-06-23 NOTE — Progress Notes (Signed)
I have read the note, and I agree with the clinical assessment and plan.  Paighton Godette K Vikkie Goeden   

## 2021-06-23 NOTE — Patient Instructions (Addendum)
Your Plan:  Continue Depakote 500 mg twice a day Increase Namenda ER 14 mg daily If your symptoms worsen or you develop new symptoms please let us know.   Thank you for coming to see [place] at [facility]. I hope we have been able to provide you high quality care today.  You may receive a patient satisfaction survey over the next few weeks. We would appreciate your feedback and comments so that we may continue to improve ourselves and the health of our patients.

## 2021-06-24 LAB — VALPROIC ACID LEVEL: Valproic Acid Lvl: 53 ug/mL (ref 50–100)

## 2021-06-27 ENCOUNTER — Encounter: Payer: Self-pay | Admitting: Adult Health

## 2021-06-29 DIAGNOSIS — F3181 Bipolar II disorder: Secondary | ICD-10-CM | POA: Diagnosis not present

## 2021-07-18 DIAGNOSIS — M810 Age-related osteoporosis without current pathological fracture: Secondary | ICD-10-CM | POA: Diagnosis not present

## 2021-10-26 DIAGNOSIS — F3181 Bipolar II disorder: Secondary | ICD-10-CM | POA: Diagnosis not present

## 2021-12-20 NOTE — Progress Notes (Signed)
? ? ?PATIENT: Jaime Barry ?DOB: 04/10/1941 ? ?REASON FOR VISIT: follow up ?HISTORY FROM: patient ? ?HISTORY OF PRESENT ILLNESS: ?Today 12/20/21: ? ?Ms. Jaime Barry is a 81 year old female with a history of seizures and memory disturbance. She returns today for follow-up. ? ?Seizures: Continue on Depakote 500 mg BID. No seizures.  ? ?Memory: Increased Namenda XR to 14 mg daily. Doing well. Able to complete all ADLS. Good appetite but having difficulty with digestion- trouble with beef and fried foods. Not driving. She manages her own medications.  ? ?06/23/21: Ms. Jaime Barry is a 81 year old female with a history of seizures and memory disturbance.  She returns today for follow-up. ? ?Seizures: Stable, continues on Depakote 500 mg twice a day.  Reports that she recently had a Depakote level checked with her PCP and her husband reports that it was not detectable but the patient states that she is taking the medication. ? ?Memory: Patient currently on Namenda extended release 7 mg daily.  Reports that she is tolerating this well.  Denies any new symptoms.  She lives at home with her husband.  Able to complete all ADLs independently.  She does not operate a motor vehicle.  Returns today for an evaluation. ? ?12/15/20: Ms. Jaime Barry is a 81 year old female with a history of seizures and memory disturbance.  She returns today for follow-up.  She is here with her husband.  Overall she feels that she has remained stable.  Husband states that a while back she stopped Keppra and the Namenda.  She felt that the medication was making her feel better.  Patient states that she did feel better initially however now she is not sure if it was due to the medication.  She remains on Depakote prescribed by Dr. Donell BeersPlovsky.  She denies any seizure events.  Husband is noticing changes in her memory.  She is able to complete all ADLs independently.  She does not operate a motor vehicle. ? ? ?09/08/19: Ms. Jaime Barry is a 81 year old female with a history of  seizures and memory disturbance.  She returns today for follow-up.  She feels that her memory has remained the same.  She is able to complete all ADLs independently.  She no longer does any cooking.  Her husband manages the finances.  She denies any seizure events.  She remains on Keppra for seizures and Namenda for her memory.  She returns today for an evaluation. ? ?HISTORY 03/03/19: ?  ?Ms. Jaime Barry is a 81 year old female with a history of seizures and memory disturbance.  She returns today for a virtual visit her husband reports that her memory has remained stable.  She is able to complete all ADLs independently.  She states that she is moving slower.  She reports that she has had a fall at least daily for a week.  Reports that her last fall was yesterday.  She did see her primary care provider who diagnosed her with a urinary tract infection.  She was started on antibiotics.  She denies any seizure events.  Continues on Keppra.  Returns today for a virtual visit ? ?REVIEW OF SYSTEMS: Out of a complete 14 system review of symptoms, the patient complains only of the following symptoms, and all other reviewed systems are negative. ? ?See HPI ? ?ALLERGIES: ?No Known Allergies ? ?HOME MEDICATIONS: ?Outpatient Medications Prior to Visit  ?Medication Sig Dispense Refill  ? Calcium 600-200 MG-UNIT tablet Take 1 tablet by mouth daily.    ? denosumab (PROLIA) 60  MG/ML SOLN injection Inject 60 mg into the skin every 6 (six) months. Administer in upper arm, thigh, or abdomen    ? divalproex (DEPAKOTE ER) 500 MG 24 hr tablet Take 500 mg by mouth 2 (two) times daily.     ? memantine (NAMENDA XR) 14 MG CP24 24 hr capsule Take 1 capsule (14 mg total) by mouth daily. 30 capsule 5  ? ?No facility-administered medications prior to visit.  ? ? ?PAST MEDICAL HISTORY: ?Past Medical History:  ?Diagnosis Date  ? Altered mental state 08/29/2012  ? Dementia (HCC)   ? Dementia (HCC)   ? progressive   ? H/O cerebral aneurysm repair   ? in  December of 2011  ? Lower leg edema   ? Osteoporosis   ? Seizure (HCC)   ? Seizures (HCC) 08/29/2012  ? Spinal stenosis   ? ? ?PAST SURGICAL HISTORY: ?Past Surgical History:  ?Procedure Laterality Date  ? artery aneurysm clipping Right   ? CEREBRAL ANEURYSM REPAIR  09/2010  ? paraophthalmic artery /note 08/29/2012  ? LUMBAR PUNCTURE  08/29/2012  ? TONSILLECTOMY    ? ? ?FAMILY HISTORY: ?Family History  ?Problem Relation Age of Onset  ? Alzheimer's disease Mother   ? Leukemia Father   ?     Died in his early 64s  ? ? ?SOCIAL HISTORY: ?Social History  ? ?Socioeconomic History  ? Marital status: Married  ?  Spouse name: Not on file  ? Number of children: 0  ? Years of education: college  ? Highest education level: Not on file  ?Occupational History  ?  Comment: Retired  ?Tobacco Use  ? Smoking status: Former  ? Smokeless tobacco: Never  ? Tobacco comments:  ?  Quit 1965  ?Substance and Sexual Activity  ? Alcohol use: No  ?  Alcohol/week: 1.0 standard drink  ?  Types: 1 Shots of liquor per week  ? Drug use: No  ? Sexual activity: Not on file  ?Other Topics Concern  ? Not on file  ?Social History Narrative  ? ** Merged History Encounter **  ?    ? The patient is married and lives with her husband.   ? Patient has her masters  ? Patient has no children.   ? Patient is retired.  ? ?Social Determinants of Health  ? ?Financial Resource Strain: Not on file  ?Food Insecurity: Not on file  ?Transportation Needs: Not on file  ?Physical Activity: Not on file  ?Stress: Not on file  ?Social Connections: Not on file  ?Intimate Partner Violence: Not on file  ? ? ? ? ?PHYSICAL EXAM ? ?Vitals:  ? 12/21/21 0835  ?BP: 110/68  ?Pulse: 96  ?Weight: 129 lb (58.5 kg)  ? ? ?Body mass index is 24.37 kg/m?.  ? ? ?  12/21/2021  ?  8:41 AM 06/23/2021  ?  8:03 AM 12/15/2020  ?  9:47 AM  ?MMSE - Mini Mental State Exam  ?Orientation to time 5 5 5   ?Orientation to Place 5 4 5   ?Registration 3 3 3   ?Attention/ Calculation 1 5 1   ?Recall 2 3 3   ?Language-  name 2 objects 2 2 2   ?Language- repeat 1 1 1   ?Language- follow 3 step command 3 2 3   ?Language- read & follow direction 1 1 1   ?Write a sentence 1 1 1   ?Copy design 1 1   ?Total score 25 28   ? ? ? ?Generalized: Well developed, in no acute distress  ? ?  Neurological examination  ?Mentation: Alert oriented to time, place, history taking. Follows all commands speech and language fluent ?Cranial nerve II-XII: Pupils were equal round reactive to light. Extraocular movements were full, visual field were full on confrontational test. e. Head turning and shoulder shrug  were normal and symmetric. ?Motor: The motor testing reveals 5 over 5 strength of all 4 extremities. Good symmetric motor tone is noted throughout.  ?Sensory: Sensory testing is intact to soft touch on all 4 extremities. No evidence of extinction is noted.  ?Coordination: Cerebellar testing reveals good finger-nose-finger and heel-to-shin bilaterally.  ?Gait and station: Gait is normal.  ?Reflexes: Deep tendon reflexes are symmetric and normal bilaterally.  ? ?DIAGNOSTIC DATA (LABS, IMAGING, TESTING) ?- I reviewed patient records, labs, notes, testing and imaging myself where available. ? ?Lab Results  ?Component Value Date  ? WBC 4.7 09/01/2012  ? HGB 11.7 (L) 09/01/2012  ? HCT 35.6 (L) 09/01/2012  ? MCV 99.4 09/01/2012  ? PLT 169 09/01/2012  ? ?   ?Component Value Date/Time  ? NA 142 09/01/2012 0612  ? K 3.5 09/01/2012 0612  ? CL 109 09/01/2012 0612  ? CO2 27 09/01/2012 0612  ? GLUCOSE 87 09/01/2012 0612  ? BUN 7 09/01/2012 0612  ? CREATININE 0.63 09/01/2012 0612  ? CALCIUM 8.5 09/01/2012 0612  ? PROT 6.4 08/29/2012 0534  ? ALBUMIN 3.7 08/29/2012 0534  ? AST 18 08/29/2012 0534  ? ALT 14 08/29/2012 0534  ? ALKPHOS 48 08/29/2012 0534  ? BILITOT 0.3 08/29/2012 0534  ? GFRNONAA 89 (L) 09/01/2012 0612  ? GFRAA >90 09/01/2012 0612  ? ?No results found for: CHOL, HDL, LDLCALC, LDLDIRECT, TRIG, CHOLHDL ?Lab Results  ?Component Value Date  ? HGBA1C 5.9 (H)  08/29/2012  ? ?No results found for: VITAMINB12 ?Lab Results  ?Component Value Date  ? TSH 1.215 08/29/2012  ? ? ? ? ?ASSESSMENT AND PLAN ?81 y.o. year old female  has a past medical history of Altered menta

## 2021-12-21 ENCOUNTER — Encounter: Payer: Self-pay | Admitting: Adult Health

## 2021-12-21 ENCOUNTER — Ambulatory Visit: Payer: Medicare PPO | Admitting: Adult Health

## 2021-12-21 VITALS — BP 110/68 | HR 96 | Wt 129.0 lb

## 2021-12-21 DIAGNOSIS — R413 Other amnesia: Secondary | ICD-10-CM

## 2021-12-21 DIAGNOSIS — R569 Unspecified convulsions: Secondary | ICD-10-CM | POA: Diagnosis not present

## 2021-12-21 MED ORDER — MEMANTINE HCL ER 21 MG PO CP24: 21.0000 mg | ORAL_CAPSULE | Freq: Every day | ORAL | 3 refills | Status: DC

## 2021-12-22 LAB — CBC WITH DIFFERENTIAL/PLATELET
Basophils Absolute: 0.1 10*3/uL (ref 0.0–0.2)
Basos: 1 %
EOS (ABSOLUTE): 0.1 10*3/uL (ref 0.0–0.4)
Eos: 3 %
Hematocrit: 36.8 % (ref 34.0–46.6)
Hemoglobin: 12.8 g/dL (ref 11.1–15.9)
Immature Grans (Abs): 0 10*3/uL (ref 0.0–0.1)
Immature Granulocytes: 0 %
Lymphocytes Absolute: 1.3 10*3/uL (ref 0.7–3.1)
Lymphs: 28 %
MCH: 34.5 pg — ABNORMAL HIGH (ref 26.6–33.0)
MCHC: 34.8 g/dL (ref 31.5–35.7)
MCV: 99 fL — ABNORMAL HIGH (ref 79–97)
Monocytes Absolute: 0.5 10*3/uL (ref 0.1–0.9)
Monocytes: 11 %
Neutrophils Absolute: 2.7 10*3/uL (ref 1.4–7.0)
Neutrophils: 57 %
Platelets: 166 10*3/uL (ref 150–450)
RBC: 3.71 x10E6/uL — ABNORMAL LOW (ref 3.77–5.28)
RDW: 12.1 % (ref 11.7–15.4)
WBC: 4.7 10*3/uL (ref 3.4–10.8)

## 2021-12-22 LAB — COMPREHENSIVE METABOLIC PANEL
ALT: 16 IU/L (ref 0–32)
AST: 25 IU/L (ref 0–40)
Albumin/Globulin Ratio: 2 (ref 1.2–2.2)
Albumin: 3.9 g/dL (ref 3.7–4.7)
Alkaline Phosphatase: 35 IU/L — ABNORMAL LOW (ref 44–121)
BUN/Creatinine Ratio: 15 (ref 12–28)
BUN: 14 mg/dL (ref 8–27)
Bilirubin Total: 0.4 mg/dL (ref 0.0–1.2)
CO2: 23 mmol/L (ref 20–29)
Calcium: 9.5 mg/dL (ref 8.7–10.3)
Chloride: 107 mmol/L — ABNORMAL HIGH (ref 96–106)
Creatinine, Ser: 0.93 mg/dL (ref 0.57–1.00)
Globulin, Total: 2 g/dL (ref 1.5–4.5)
Glucose: 91 mg/dL (ref 70–99)
Potassium: 4.4 mmol/L (ref 3.5–5.2)
Sodium: 147 mmol/L — ABNORMAL HIGH (ref 134–144)
Total Protein: 5.9 g/dL — ABNORMAL LOW (ref 6.0–8.5)
eGFR: 63 mL/min/{1.73_m2} (ref >59)

## 2021-12-22 LAB — VALPROIC ACID LEVEL: Valproic Acid Lvl: 82 ug/mL (ref 50–100)

## 2021-12-27 ENCOUNTER — Telehealth: Payer: Self-pay | Admitting: *Deleted

## 2021-12-27 DIAGNOSIS — E870 Hyperosmolality and hypernatremia: Secondary | ICD-10-CM

## 2021-12-27 NOTE — Telephone Encounter (Signed)
Spoke with NP. Order placed for Sodium redraw 1-2 months.  ?

## 2021-12-27 NOTE — Addendum Note (Signed)
Addended by: Bertram Savin on: 12/27/2021 11:49 AM ? ? Modules accepted: Orders ? ?

## 2021-12-27 NOTE — Telephone Encounter (Signed)
-----   Message from Butch Penny, NP sent at 12/27/2021  7:59 AM EDT ----- ?Sodium level slightly elevated. Does she have a pending appointment with PCP that labs could be rechecked if not- we will recheck in 1-2 months ?

## 2021-12-27 NOTE — Telephone Encounter (Signed)
Spoke with patient's husband and discussed lab results.  He verbalized understanding and he suggested that we do the lab recheck because patient will not be seeing primary care until summer.  He would like a reminder to bring patient in for labs when it gets closer to the 1 to 75-month mark.  ?

## 2022-01-09 ENCOUNTER — Other Ambulatory Visit: Payer: Self-pay | Admitting: Adult Health

## 2022-01-17 DIAGNOSIS — M810 Age-related osteoporosis without current pathological fracture: Secondary | ICD-10-CM | POA: Diagnosis not present

## 2022-01-26 ENCOUNTER — Other Ambulatory Visit (INDEPENDENT_AMBULATORY_CARE_PROVIDER_SITE_OTHER): Payer: Self-pay

## 2022-01-26 ENCOUNTER — Encounter: Payer: Self-pay | Admitting: *Deleted

## 2022-01-26 DIAGNOSIS — E870 Hyperosmolality and hypernatremia: Secondary | ICD-10-CM | POA: Diagnosis not present

## 2022-01-26 DIAGNOSIS — Z0289 Encounter for other administrative examinations: Secondary | ICD-10-CM

## 2022-01-26 NOTE — Telephone Encounter (Signed)
This was already taken care of.  Opened in error.  ?

## 2022-01-26 NOTE — Telephone Encounter (Signed)
-----   Message from Bertram Savin, RN sent at 12/27/2021 10:17 AM EDT ----- ?Regarding: need labs redrawn ?Need lab rechecked 1-2 months from 12/26/2021. Call patient's husband with reminder to come in and have the labs redrawn.  ? ?

## 2022-01-26 NOTE — Telephone Encounter (Signed)
Spoke with pt's husband Mr [place]i (on [place]). He scheduled Sodium level recheck for this afternoon at 430 pm.  ?

## 2022-01-27 LAB — SODIUM: Sodium: 144 mmol/L (ref 134–144)

## 2022-02-08 DIAGNOSIS — N3000 Acute cystitis without hematuria: Secondary | ICD-10-CM | POA: Diagnosis not present

## 2022-03-23 DIAGNOSIS — F3181 Bipolar II disorder: Secondary | ICD-10-CM | POA: Diagnosis not present

## 2022-05-18 DIAGNOSIS — L821 Other seborrheic keratosis: Secondary | ICD-10-CM | POA: Diagnosis not present

## 2022-05-18 DIAGNOSIS — Z85828 Personal history of other malignant neoplasm of skin: Secondary | ICD-10-CM | POA: Diagnosis not present

## 2022-05-18 DIAGNOSIS — D225 Melanocytic nevi of trunk: Secondary | ICD-10-CM | POA: Diagnosis not present

## 2022-06-22 DIAGNOSIS — M8580 Other specified disorders of bone density and structure, unspecified site: Secondary | ICD-10-CM | POA: Diagnosis not present

## 2022-06-22 DIAGNOSIS — Z79899 Other long term (current) drug therapy: Secondary | ICD-10-CM | POA: Diagnosis not present

## 2022-06-27 DIAGNOSIS — Z0000 Encounter for general adult medical examination without abnormal findings: Secondary | ICD-10-CM | POA: Diagnosis not present

## 2022-06-27 DIAGNOSIS — G40309 Generalized idiopathic epilepsy and epileptic syndromes, not intractable, without status epilepticus: Secondary | ICD-10-CM | POA: Diagnosis not present

## 2022-06-27 DIAGNOSIS — M810 Age-related osteoporosis without current pathological fracture: Secondary | ICD-10-CM | POA: Diagnosis not present

## 2022-06-27 DIAGNOSIS — Z6823 Body mass index (BMI) 23.0-23.9, adult: Secondary | ICD-10-CM | POA: Diagnosis not present

## 2022-06-27 DIAGNOSIS — Z79899 Other long term (current) drug therapy: Secondary | ICD-10-CM | POA: Diagnosis not present

## 2022-06-27 DIAGNOSIS — Z1331 Encounter for screening for depression: Secondary | ICD-10-CM | POA: Diagnosis not present

## 2022-06-27 DIAGNOSIS — Z23 Encounter for immunization: Secondary | ICD-10-CM | POA: Diagnosis not present

## 2022-06-27 DIAGNOSIS — F0390 Unspecified dementia without behavioral disturbance: Secondary | ICD-10-CM | POA: Diagnosis not present

## 2022-06-29 DIAGNOSIS — F3181 Bipolar II disorder: Secondary | ICD-10-CM | POA: Diagnosis not present

## 2022-07-20 DIAGNOSIS — M810 Age-related osteoporosis without current pathological fracture: Secondary | ICD-10-CM | POA: Diagnosis not present

## 2022-09-04 ENCOUNTER — Other Ambulatory Visit: Payer: Self-pay

## 2022-09-04 MED ORDER — COVID-19 MRNA 2023-2024 VACCINE (COMIRNATY) 0.3 ML INJECTION
0.3000 mL | Freq: Once | INTRAMUSCULAR | 0 refills | Status: AC
  Filled 2022-09-04: qty 0.3, 1d supply, fill #0

## 2022-09-26 ENCOUNTER — Other Ambulatory Visit: Payer: Self-pay

## 2022-09-27 ENCOUNTER — Other Ambulatory Visit: Payer: Self-pay

## 2022-11-02 DIAGNOSIS — F3181 Bipolar II disorder: Secondary | ICD-10-CM | POA: Diagnosis not present

## 2022-12-27 ENCOUNTER — Ambulatory Visit: Payer: Medicare PPO | Admitting: Adult Health

## 2022-12-27 ENCOUNTER — Encounter: Payer: Self-pay | Admitting: Adult Health

## 2022-12-27 VITALS — BP 115/66 | HR 89 | Ht 60.0 in | Wt 122.0 lb

## 2022-12-27 DIAGNOSIS — R413 Other amnesia: Secondary | ICD-10-CM

## 2022-12-27 DIAGNOSIS — R569 Unspecified convulsions: Secondary | ICD-10-CM

## 2022-12-27 NOTE — Progress Notes (Signed)
PATIENT: Jaime Barry DOB: 09-14-1941  REASON FOR VISIT: follow up HISTORY FROM: patient  Chief Complaint  Patient presents with   Follow-up    Pt in 19 with husband Pt states no seizures since last visit Pt states memory is worse since last visit      HISTORY OF PRESENT ILLNESS: Today 12/27/22:  Jaime Barry is a 82 y.o. female with a history of seizures and memory disturbance. Returns today for follow-up.   Seizures: Denies any seizure events.  Remains on Depakote 500 mg twice a day  Memory disturbance: Feels that her memory is worse.  She continues to live at home with her husband.  Able to complete all ADLs independently.  Remains on Namenda 21 mg daily.  She is able to manage her own medications and appointments.  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  States that last time EMS came they reported them and therefore they had to clean up their house.  Husband states that they took 7 truck loads of trash away.     12/21/21: Jaime Barry is a 82 year old female with a history of seizures and memory disturbance. She returns today for follow-up.  Seizures: Continue on Depakote 500 mg BID. No seizures.   Memory: Increased Namenda XR to 14 mg daily. Doing well. Able to complete all ADLS. Good appetite but having difficulty with digestion- trouble with beef and fried foods. Not driving. She manages her own medications.   06/23/21: Jaime Barry is a 82 year old female with a history of seizures and memory disturbance.  She returns today for follow-up.  Seizures: Stable, continues on Depakote 500 mg twice a day.  Reports that she recently had a Depakote level checked with her PCP and her husband reports that it was not detectable but the patient states that she is taking the medication.  Memory: Patient currently on Namenda extended release 7 mg daily.  Reports that she is tolerating this well.  Denies any new symptoms.  She lives at home with her husband.  Able to complete all ADLs  independently.  She does not operate a motor vehicle.  Returns today for an evaluation.  12/15/20: Jaime Barry is a 82 year old female with a history of seizures and memory disturbance.  She returns today for follow-up.  She is here with her husband.  Overall she feels that she has remained stable.  Husband states that a while back she stopped Keppra and the Namenda.  She felt that the medication was making her feel better.  Patient states that she did feel better initially however now she is not sure if it was due to the medication.  She remains on Depakote prescribed by Dr. Casimiro Needle.  She denies any seizure events.  Husband is noticing changes in her memory.  She is able to complete all ADLs independently.  She does not operate a motor vehicle.   09/08/19: [name] is a 82 year old female with a history of seizures and memory disturbance.  She returns today for follow-up.  She feels that her memory has remained the same.  She is able to complete all ADLs independently.  She no longer does any cooking.  Her husband manages the finances.  She denies any seizure events.  She remains on Keppra for seizures and Namenda for her memory.  She returns today for an evaluation.  HISTORY 03/03/19:   Jaime Barry is a 82 year old female with a history of seizures and memory disturbance.  She returns  today for a virtual visit her husband reports that her memory has remained stable.  She is able to complete all ADLs independently.  She states that she is moving slower.  She reports that she has had a fall at least daily for a week.  Reports that her last fall was yesterday.  She did see her primary care provider who diagnosed her with a urinary tract infection.  She was started on antibiotics.  She denies any seizure events.  Continues on Keppra.  Returns today for a virtual visit  REVIEW OF SYSTEMS: Out of a complete 14 system review of symptoms, the patient complains only of the following symptoms, and all other  reviewed systems are negative.  See HPI  ALLERGIES: No Known Allergies  HOME MEDICATIONS: Outpatient Medications Prior to Visit  Medication Sig Dispense Refill   Calcium 600-200 MG-UNIT tablet Take 1 tablet by mouth daily.     denosumab (PROLIA) 60 MG/ML SOLN injection Inject 60 mg into the skin every 6 (six) months. Administer in upper arm, thigh, or abdomen     divalproex (DEPAKOTE ER) 500 MG 24 hr tablet Take 500 mg by mouth 2 (two) times daily.      memantine (NAMENDA XR) 21 MG CP24 24 hr capsule Take 1 capsule (21 mg total) by mouth daily. 90 capsule 3   No facility-administered medications prior to visit.    PAST MEDICAL HISTORY: Past Medical History:  Diagnosis Date   Altered mental state 08/29/2012   Dementia ([place])    Dementia ([place])    progressive    H/O cerebral aneurysm repair    in December of 2011   Lower leg edema    Osteoporosis    Seizure ([facility])    Seizures ([place]) 08/29/2012   Spinal stenosis     PAST SURGICAL HISTORY: Past Surgical History:  Procedure Laterality Date   artery aneurysm clipping Right    CEREBRAL ANEURYSM REPAIR  09/2010   paraophthalmic artery /note 08/29/2012   LUMBAR PUNCTURE  08/29/2012   TONSILLECTOMY      FAMILY HISTORY: Family History  Problem Relation Age of Onset   Alzheimer's disease Mother    Leukemia Father        Died in his early 15s    SOCIAL HISTORY: Social History   Socioeconomic History   Marital status: Married    Spouse name: Not on file   Number of children: 0   Years of education: college   Highest education level: Not on file  Occupational History    Comment: Retired  Tobacco Use   Smoking status: Former   Smokeless tobacco: Never   Tobacco comments:    Quit 1965  Substance and Sexual Activity   Alcohol use: No    Alcohol/week: 1.0 standard drink of alcohol    Types: 1 Shots of liquor per week   Drug use: No   Sexual activity: Not on file  Other Topics Concern   Not on file  Social History  Narrative   ** Merged History Encounter **       The patient is married and lives with her husband.    Patient has her masters   Patient has no children.    Patient is retired.   Social Determinants of Health   Financial Resource Strain: Not on file  Food Insecurity: Not on file  Transportation Needs: Not on file  Physical Activity: Not on file  Stress: Not on file  Social Connections: Not on file  Intimate Partner Violence: Not on file      PHYSICAL EXAM  Vitals:   12/27/22 0833  BP: 115/66  Pulse: 89  Weight: 122 lb (55.3 kg)  Height: 5' (1.524 m)    Body mass index is 23.83 kg/m.      12/27/2022    8:33 AM 12/21/2021    8:41 AM 06/23/2021    8:03 AM  MMSE - Mini Mental State Exam  Orientation to time 4 5 5   Orientation to Place 5 5 4   Registration 3 3 3   Attention/ Calculation 1 1 5   Recall 3 2 3   Language- name 2 objects 2 2 2   Language- repeat 1 1 1   Language- follow 3 step command 3 3 2   Language- read & follow direction 1 1 1   Write a sentence 1 1 1   Copy design 1 1 1   Total score 25 25 28      Generalized: Well developed, in no acute distress   Neurological examination  Mentation: Alert oriented to time, place, history taking. Follows all commands speech and language fluent Cranial nerve II-XII: Pupils were equal round reactive to light. Extraocular movements were full, visual field were full on confrontational test.  Head turning and shoulder shrug  were normal and symmetric. Motor: The motor testing reveals 5 over 5 strength of all 4 extremities. Good symmetric motor tone is noted throughout.  Sensory: Sensory testing is intact to soft touch on all 4 extremities. No evidence of extinction is noted.  Coordination: Cerebellar testing reveals good finger-nose-finger and heel-to-shin bilaterally.  Gait and station: Gait is normal.    DIAGNOSTIC DATA (LABS, IMAGING, TESTING) - I reviewed patient records, labs, notes, testing and imaging myself where  available.  Lab Results  Component Value Date   WBC 4.7 12/21/2021   HGB 12.8 12/21/2021   HCT 36.8 12/21/2021   MCV 99 (H) 12/21/2021   PLT 166 12/21/2021      Component Value Date/Time   NA 144 01/26/2022 1625   K 4.4 12/21/2021 0913   CL 107 (H) 12/21/2021 0913   CO2 23 12/21/2021 0913   GLUCOSE 91 12/21/2021 0913   GLUCOSE 87 09/01/2012 0612   BUN 14 12/21/2021 0913   CREATININE 0.93 12/21/2021 0913   CALCIUM 9.5 12/21/2021 0913   PROT 5.9 (L) 12/21/2021 0913   ALBUMIN 3.9 12/21/2021 0913   AST 25 12/21/2021 0913   ALT 16 12/21/2021 0913   ALKPHOS 35 (L) 12/21/2021 0913   BILITOT 0.4 12/21/2021 0913   GFRNONAA 89 (L) 09/01/2012 0612   GFRAA >90 09/01/2012 0612   No results found for: "CHOL", "HDL", "LDLCALC", "LDLDIRECT", "TRIG", "CHOLHDL" Lab Results  Component Value Date   HGBA1C 5.9 (H) 08/29/2012   No results found for: "VITAMINB12" Lab Results  Component Value Date   TSH 1.215 08/29/2012      ASSESSMENT AND PLAN 82 y.o. year old female  has a past medical history of Altered mental state (08/29/2012), Dementia ([place]), Dementia ([place]), H/O cerebral aneurysm repair, Lower leg edema, Osteoporosis, Seizure ([place]), Seizures ([place]) (08/29/2012), and Spinal stenosis. here with:  1.  Memory disturbance  -Memory score  MMSE 25 out of 30 -Increase Namenda XR 21 mg daily  2.  Seizures  -Stable no seizure events -Remains on Depakote 500 mg twice a day  -We will check a Depakote level, CBC, CMP  today   FU 1 year or sooner if needed   Ward Givens, MSN, NP-C 12/27/2022, 8:40 AM [facility]  [place], [address] [place], [place] [address] [phone]

## 2022-12-28 ENCOUNTER — Telehealth: Payer: Self-pay | Admitting: *Deleted

## 2022-12-28 LAB — COMPREHENSIVE METABOLIC PANEL
ALT: 11 IU/L (ref 0–32)
AST: 15 IU/L (ref 0–40)
Albumin/Globulin Ratio: 1.6 (ref 1.2–2.2)
Albumin: 4.1 g/dL (ref 3.8–4.8)
Alkaline Phosphatase: 39 IU/L — ABNORMAL LOW (ref 44–121)
BUN/Creatinine Ratio: 14 (ref 12–28)
BUN: 12 mg/dL (ref 8–27)
Bilirubin Total: 0.4 mg/dL (ref 0.0–1.2)
CO2: 24 mmol/L (ref 20–29)
Calcium: 9.4 mg/dL (ref 8.7–10.3)
Chloride: 105 mmol/L (ref 96–106)
Creatinine, Ser: 0.83 mg/dL (ref 0.57–1.00)
Globulin, Total: 2.6 g/dL (ref 1.5–4.5)
Glucose: 99 mg/dL (ref 70–99)
Potassium: 4.2 mmol/L (ref 3.5–5.2)
Sodium: 142 mmol/L (ref 134–144)
Total Protein: 6.7 g/dL (ref 6.0–8.5)
eGFR: 72 mL/min/{1.73_m2} (ref >59)

## 2022-12-28 LAB — CBC WITH DIFFERENTIAL/PLATELET
Basophils Absolute: 0 10*3/uL (ref 0.0–0.2)
Basos: 1 %
EOS (ABSOLUTE): 0.1 10*3/uL (ref 0.0–0.4)
Eos: 1 %
Hematocrit: 39.5 % (ref 34.0–46.6)
Hemoglobin: 13 g/dL (ref 11.1–15.9)
Immature Grans (Abs): 0 10*3/uL (ref 0.0–0.1)
Immature Granulocytes: 0 %
Lymphocytes Absolute: 1 10*3/uL (ref 0.7–3.1)
Lymphs: 21 %
MCH: 31.9 pg (ref 26.6–33.0)
MCHC: 32.9 g/dL (ref 31.5–35.7)
MCV: 97 fL (ref 79–97)
Monocytes Absolute: 0.3 10*3/uL (ref 0.1–0.9)
Monocytes: 6 %
Neutrophils Absolute: 3.5 10*3/uL (ref 1.4–7.0)
Neutrophils: 71 %
Platelets: 226 10*3/uL (ref 150–450)
RBC: 4.07 x10E6/uL (ref 3.77–5.28)
RDW: 12.3 % (ref 11.7–15.4)
WBC: 5 10*3/uL (ref 3.4–10.8)

## 2022-12-28 LAB — VALPROIC ACID LEVEL: Valproic Acid Lvl: <4 ug/mL — ABNORMAL LOW (ref 50–100)

## 2022-12-28 NOTE — Telephone Encounter (Signed)
Patient's husband called me back.  I let him know the Depakote level was essentially nondetectable.  The normal range is 50-100 and hers is less than 4.He says that his wife tells tells him that she takes it but she does not like to take it on empty stomach so he makes sure to give her breakfast.  He will follow-up with her and discuss this and get back in touch with [place] via mychart if he can login.  He understands she is supposed to be taking 500 mg twice daily and that even 1 missed dose puts her at risk for seizure. He verbalized appreciation for the call.

## 2022-12-28 NOTE — Telephone Encounter (Signed)
Called pt's husband's phone number and his assistant answered at his office. He was with a client. She will have him call me back.

## 2022-12-28 NOTE — Telephone Encounter (Signed)
-----   Message from Ward Givens, NP sent at 12/28/2022  1:55 PM EDT ----- Depakote level shows nondetected.  Can we call her husband and verify that she is taking her medication correctly

## 2023-01-19 DIAGNOSIS — M810 Age-related osteoporosis without current pathological fracture: Secondary | ICD-10-CM | POA: Diagnosis not present

## 2023-01-28 ENCOUNTER — Other Ambulatory Visit: Payer: Self-pay | Admitting: Adult Health

## 2023-02-21 DIAGNOSIS — F3181 Bipolar II disorder: Secondary | ICD-10-CM | POA: Diagnosis not present

## 2023-04-01 ENCOUNTER — Other Ambulatory Visit: Payer: Self-pay

## 2023-04-01 ENCOUNTER — Inpatient Hospital Stay (HOSPITAL_COMMUNITY)
Admission: EM | Admit: 2023-04-01 | Discharge: 2023-04-04 | DRG: 101 | Disposition: A | Discharge status: Home Health | Payer: Medicare PPO | Attending: Internal Medicine | Admitting: Internal Medicine

## 2023-04-01 ENCOUNTER — Emergency Department (HOSPITAL_COMMUNITY): Payer: Medicare PPO

## 2023-04-01 DIAGNOSIS — G8194 Hemiplegia, unspecified affecting left nondominant side: Secondary | ICD-10-CM | POA: Diagnosis present

## 2023-04-01 DIAGNOSIS — Z820 Family history of epilepsy and other diseases of the nervous system: Secondary | ICD-10-CM | POA: Diagnosis not present

## 2023-04-01 DIAGNOSIS — Z87891 Personal history of nicotine dependence: Secondary | ICD-10-CM

## 2023-04-01 DIAGNOSIS — E876 Hypokalemia: Secondary | ICD-10-CM | POA: Diagnosis present

## 2023-04-01 DIAGNOSIS — G40909 Epilepsy, unspecified, not intractable, without status epilepticus: Secondary | ICD-10-CM | POA: Diagnosis present

## 2023-04-01 DIAGNOSIS — I672 Cerebral atherosclerosis: Secondary | ICD-10-CM | POA: Diagnosis not present

## 2023-04-01 DIAGNOSIS — E860 Dehydration: Secondary | ICD-10-CM | POA: Diagnosis present

## 2023-04-01 DIAGNOSIS — H4902 Third [oculomotor] nerve palsy, left eye: Secondary | ICD-10-CM | POA: Diagnosis present

## 2023-04-01 DIAGNOSIS — M810 Age-related osteoporosis without current pathological fracture: Secondary | ICD-10-CM | POA: Diagnosis present

## 2023-04-01 DIAGNOSIS — Z806 Family history of leukemia: Secondary | ICD-10-CM | POA: Diagnosis not present

## 2023-04-01 DIAGNOSIS — I959 Hypotension, unspecified: Secondary | ICD-10-CM | POA: Diagnosis present

## 2023-04-01 DIAGNOSIS — G40309 Generalized idiopathic epilepsy and epileptic syndromes, not intractable, without status epilepticus: Secondary | ICD-10-CM | POA: Diagnosis not present

## 2023-04-01 DIAGNOSIS — F0280 Dementia in other diseases classified elsewhere without behavioral disturbance: Secondary | ICD-10-CM | POA: Diagnosis present

## 2023-04-01 DIAGNOSIS — I6782 Cerebral ischemia: Secondary | ICD-10-CM | POA: Diagnosis not present

## 2023-04-01 DIAGNOSIS — I671 Cerebral aneurysm, nonruptured: Secondary | ICD-10-CM | POA: Diagnosis not present

## 2023-04-01 DIAGNOSIS — M4312 Spondylolisthesis, cervical region: Secondary | ICD-10-CM | POA: Diagnosis not present

## 2023-04-01 DIAGNOSIS — G8384 Todd's paralysis (postepileptic): Secondary | ICD-10-CM | POA: Diagnosis not present

## 2023-04-01 DIAGNOSIS — I469 Cardiac arrest, cause unspecified: Secondary | ICD-10-CM | POA: Diagnosis not present

## 2023-04-01 DIAGNOSIS — Z1152 Encounter for screening for COVID-19: Secondary | ICD-10-CM | POA: Diagnosis not present

## 2023-04-01 DIAGNOSIS — R569 Unspecified convulsions: Principal | ICD-10-CM

## 2023-04-01 DIAGNOSIS — Z79899 Other long term (current) drug therapy: Secondary | ICD-10-CM

## 2023-04-01 DIAGNOSIS — R414 Neurologic neglect syndrome: Secondary | ICD-10-CM | POA: Diagnosis present

## 2023-04-01 DIAGNOSIS — R29818 Other symptoms and signs involving the nervous system: Secondary | ICD-10-CM | POA: Diagnosis not present

## 2023-04-01 DIAGNOSIS — R531 Weakness: Secondary | ICD-10-CM | POA: Diagnosis not present

## 2023-04-01 DIAGNOSIS — M47812 Spondylosis without myelopathy or radiculopathy, cervical region: Secondary | ICD-10-CM | POA: Diagnosis not present

## 2023-04-01 DIAGNOSIS — M5031 Other cervical disc degeneration,  high cervical region: Secondary | ICD-10-CM | POA: Diagnosis not present

## 2023-04-01 DIAGNOSIS — G319 Degenerative disease of nervous system, unspecified: Secondary | ICD-10-CM | POA: Diagnosis not present

## 2023-04-01 DIAGNOSIS — M4802 Spinal stenosis, cervical region: Secondary | ICD-10-CM | POA: Diagnosis not present

## 2023-04-01 DIAGNOSIS — G309 Alzheimer's disease, unspecified: Secondary | ICD-10-CM | POA: Diagnosis present

## 2023-04-01 DIAGNOSIS — Z515 Encounter for palliative care: Secondary | ICD-10-CM | POA: Diagnosis not present

## 2023-04-01 DIAGNOSIS — R9431 Abnormal electrocardiogram [ECG] [EKG]: Secondary | ICD-10-CM | POA: Diagnosis not present

## 2023-04-01 LAB — CBC
HCT: 38 % (ref 36.0–46.0)
Hemoglobin: 11.7 g/dL — ABNORMAL LOW (ref 12.0–15.0)
MCH: 31.9 pg (ref 26.0–34.0)
MCHC: 30.8 g/dL (ref 30.0–36.0)
MCV: 103.5 fL — ABNORMAL HIGH (ref 80.0–100.0)
Platelets: 136 10*3/uL — ABNORMAL LOW (ref 150–400)
RBC: 3.67 MIL/uL — ABNORMAL LOW (ref 3.87–5.11)
RDW: 15.2 % (ref 11.5–15.5)
WBC: 4.6 10*3/uL (ref 4.0–10.5)
nRBC: 0 % (ref 0.0–0.2)

## 2023-04-01 LAB — COMPREHENSIVE METABOLIC PANEL
ALT: 8 U/L (ref 0–44)
AST: 24 U/L (ref 15–41)
Albumin: 3.2 g/dL — ABNORMAL LOW (ref 3.5–5.0)
Alkaline Phosphatase: 27 U/L — ABNORMAL LOW (ref 38–126)
Anion gap: 9 (ref 5–15)
BUN: 13 mg/dL (ref 8–23)
CO2: 20 mmol/L — ABNORMAL LOW (ref 22–32)
Calcium: 8.2 mg/dL — ABNORMAL LOW (ref 8.9–10.3)
Chloride: 109 mmol/L (ref 98–111)
Creatinine, Ser: 0.84 mg/dL (ref 0.44–1.00)
GFR, Estimated: >60 mL/min (ref >60)
Glucose, Bld: 129 mg/dL — ABNORMAL HIGH (ref 70–99)
Potassium: 3.9 mmol/L (ref 3.5–5.1)
Sodium: 138 mmol/L (ref 135–145)
Total Bilirubin: 0.8 mg/dL (ref 0.3–1.2)
Total Protein: 5.6 g/dL — ABNORMAL LOW (ref 6.5–8.1)

## 2023-04-01 LAB — I-STAT CHEM 8, ED
BUN: 13 mg/dL (ref 8–23)
Calcium, Ion: 0.99 mmol/L — ABNORMAL LOW (ref 1.15–1.40)
Chloride: 109 mmol/L (ref 98–111)
Creatinine, Ser: 0.7 mg/dL (ref 0.44–1.00)
Glucose, Bld: 127 mg/dL — ABNORMAL HIGH (ref 70–99)
HCT: 37 % (ref 36.0–46.0)
Hemoglobin: 12.6 g/dL (ref 12.0–15.0)
Potassium: 3.5 mmol/L (ref 3.5–5.1)
Sodium: 141 mmol/L (ref 135–145)
TCO2: 22 mmol/L (ref 22–32)

## 2023-04-01 LAB — PROTIME-INR
INR: 1 (ref 0.8–1.2)
Prothrombin Time: 13.5 seconds (ref 11.4–15.2)

## 2023-04-01 LAB — DIFFERENTIAL
Abs Immature Granulocytes: 0.07 10*3/uL (ref 0.00–0.07)
Basophils Absolute: 0 10*3/uL (ref 0.0–0.1)
Basophils Relative: 1 %
Eosinophils Absolute: 0 10*3/uL (ref 0.0–0.5)
Eosinophils Relative: 0 %
Immature Granulocytes: 2 %
Lymphocytes Relative: 19 %
Lymphs Abs: 0.9 10*3/uL (ref 0.7–4.0)
Monocytes Absolute: 0.2 10*3/uL (ref 0.1–1.0)
Monocytes Relative: 4 %
Neutro Abs: 3.5 10*3/uL (ref 1.7–7.7)
Neutrophils Relative %: 74 %

## 2023-04-01 LAB — ETHANOL: Alcohol, Ethyl (B): <10 mg/dL (ref <10)

## 2023-04-01 LAB — SARS CORONAVIRUS 2 BY RT PCR: SARS Coronavirus 2 by RT PCR: NEGATIVE

## 2023-04-01 LAB — APTT: aPTT: 25 seconds (ref 24–36)

## 2023-04-01 LAB — CBG MONITORING, ED: Glucose-Capillary: 115 mg/dL — ABNORMAL HIGH (ref 70–99)

## 2023-04-01 LAB — VALPROIC ACID LEVEL: Valproic Acid Lvl: <10 ug/mL — ABNORMAL LOW (ref 50.0–100.0)

## 2023-04-01 MED ORDER — ORAL CARE MOUTH RINSE: 15.0000 mL | OROMUCOSAL | Status: DC | PRN

## 2023-04-01 MED ORDER — VALPROATE SODIUM 100 MG/ML IV SOLN
1500.0000 mg | Freq: Once | INTRAVENOUS | Status: AC
  Administered 2023-04-01: 1500 mg via INTRAVENOUS
  Filled 2023-04-01: qty 15

## 2023-04-01 MED ORDER — ENOXAPARIN SODIUM 40 MG/0.4ML IJ SOSY
40.0000 mg | PREFILLED_SYRINGE | INTRAMUSCULAR | Status: DC
  Administered 2023-04-02 – 2023-04-03 (×2): 40 mg via SUBCUTANEOUS
  Filled 2023-04-01 (×3): qty 0.4

## 2023-04-01 MED ORDER — SODIUM CHLORIDE 0.9 % IV SOLN
75.0000 mL/h | INTRAVENOUS | Status: DC
  Administered 2023-04-01 – 2023-04-02 (×2): 75 mL/h via INTRAVENOUS

## 2023-04-01 MED ORDER — LORAZEPAM 2 MG/ML IJ SOLN: 4.0000 mg | INTRAMUSCULAR | Status: DC | PRN

## 2023-04-01 MED ORDER — ORAL CARE MOUTH RINSE: 15.0000 mL | OROMUCOSAL | Status: DC

## 2023-04-01 MED ORDER — IOHEXOL 350 MG/ML SOLN
100.0000 mL | Freq: Once | INTRAVENOUS | Status: AC | PRN
  Administered 2023-04-01: 100 mL via INTRAVENOUS

## 2023-04-01 MED ORDER — MEMANTINE HCL ER 7 MG PO CP24
21.0000 mg | ORAL_CAPSULE | Freq: Every day | ORAL | Status: DC
  Administered 2023-04-02 – 2023-04-04 (×3): 21 mg via ORAL
  Filled 2023-04-01 (×3): qty 1

## 2023-04-01 MED ORDER — VALPROATE SODIUM 100 MG/ML IV SOLN
500.0000 mg | Freq: Three times a day (TID) | INTRAVENOUS | Status: DC
  Administered 2023-04-02 – 2023-04-04 (×8): 500 mg via INTRAVENOUS
  Filled 2023-04-01 (×17): qty 5

## 2023-04-01 MED ORDER — MEMANTINE HCL ER 7 MG PO CP24: 21.0000 mg | ORAL_CAPSULE | Freq: Every day | ORAL | Status: DC

## 2023-04-01 NOTE — H&P (Addendum)
History and Physical    Jaime Barry YQM:[id number] DOB: 1940-11-08 DOA: 04/01/2023  PCP: Gweneth Dimitri, MD (Confirm with patient/family/NH records and if not entered, this has to be entered at [facility] point of entry) Patient coming from: Home  I have personally briefly reviewed patient's old medical records in [facility]  Chief Complaint: seizure  HPI: Jaime Barry is a 82 y.o. female with medical history significant of advanced dementia, seizure disorder on Depakote, cerebral aneurysm presented with seizure activity at home.  Husband heard that patient was screaming and went to see her found her on the floor with right-sided twitching movement and call EMS.  EMS arrived and found the patient still twitching on the right side and patient was given Versed which broke her seizing.  Husband reported that patient had sore throat and runny nose for the last few days and slept most of the day yesterday and unsure whether she took her usual medications yesterday.  Patient does not remember what has happened and reported no headache vision changes.  On arrival in the ED, patient was awake but neurological exam found patient has significant left-sided weakness and left-sided visual neglect, code stroke was called and CT head showed no acute findings CT neck negative for fracture or dislocation, CTA head and neck negative for LVO.  Blood work showed valproic acid level<10. UA pending   Review of Systems: As per HPI otherwise 14 point review of systems negative.    Past Medical History:  Diagnosis Date   Altered mental state 08/29/2012   Dementia (HCC)    Dementia (HCC)    progressive    H/O cerebral aneurysm repair    in December of 2011   Lower leg edema    Osteoporosis    Seizure (HCC)    Seizures (HCC) 08/29/2012   Spinal stenosis     Past Surgical History:  Procedure Laterality Date   artery aneurysm clipping Right    CEREBRAL ANEURYSM REPAIR  09/2010   paraophthalmic artery  /note 08/29/2012   LUMBAR PUNCTURE  08/29/2012   TONSILLECTOMY       reports that she has quit smoking. She has never used smokeless tobacco. She reports that she does not drink alcohol and does not use drugs.  No Known Allergies  Family History  Problem Relation Age of Onset   Alzheimer's disease Mother    Leukemia Father        Died in his early 8s     Prior to Admission medications   Medication Sig Start Date End Date Taking? Authorizing Provider  Calcium 600-200 MG-UNIT tablet Take 1 tablet by mouth daily.    [provider]  denosumab (PROLIA) 60 MG/ML SOLN injection Inject 60 mg into the skin every 6 (six) months. Administer in upper arm, thigh, or abdomen    [provider]  divalproex (DEPAKOTE ER) 500 MG 24 hr tablet Take 500 mg by mouth 2 (two) times daily.  03/22/15   [provider]  memantine (NAMENDA XR) 21 MG CP24 24 hr capsule TAKE 1 CAPSULE(21 MG) BY MOUTH DAILY 01/31/23   Butch Penny, NP    Physical Exam: Vitals:   04/01/23 1540 04/01/23 1600 04/01/23 1700 04/01/23 1800  BP: (!) 101/57 (!) 103/59 (!) 107/58 (!) 113/58  Pulse: 89 87 71 70  Resp: 18 16 (!) 9 16  SpO2: 93% 93% 95% 98%  Weight: 55.3 kg     Height: 5' (1.524 m)  Constitutional: NAD, calm, comfortable Vitals:   04/01/23 1540 04/01/23 1600 04/01/23 1700 04/01/23 1800  BP: (!) 101/57 (!) 103/59 (!) 107/58 (!) 113/58  Pulse: 89 87 71 70  Resp: 18 16 (!) 9 16  SpO2: 93% 93% 95% 98%  Weight: 55.3 kg     Height: 5' (1.524 m)      Eyes: PERRL, lids and conjunctivae normal ENMT: Mucous membranes are moist. Posterior pharynx clear of any exudate or lesions.Normal dentition.  Neck: normal, supple, no masses, no thyromegaly Respiratory: clear to auscultation bilaterally, no wheezing, no crackles. Normal respiratory effort. No accessory muscle use.  Cardiovascular: Regular rate and rhythm, no murmurs / rubs / gallops. No extremity edema. 2+ pedal pulses. No carotid  bruits.  Abdomen: no tenderness, no masses palpated. No hepatosplenomegaly. Bowel sounds positive.  Musculoskeletal: no clubbing / cyanosis. No joint deformity upper and lower extremities. Good ROM, no contractures. Normal muscle tone.  Skin: no rashes, lesions, ulcers. No induration Neurologic: CN 2-12 grossly intact. Sensation intact, DTR normal.  Strength 4/5 on left arm and left leg compared to 5/5 on the right side Psychiatric: Awake, oriented to person and place, confused about time    Labs on Admission: I have personally reviewed following labs and imaging studies  CBC: Recent Labs  Lab 04/01/23 1546 04/01/23 1549  WBC 4.6  --   NEUTROABS 3.5  --   HGB 11.7* 12.6  HCT 38.0 37.0  MCV 103.5*  --   PLT 136*  --    Basic Metabolic Panel: Recent Labs  Lab 04/01/23 1546 04/01/23 1549  NA 138 141  K 3.9 3.5  CL 109 109  CO2 20*  --   GLUCOSE 129* 127*  BUN 13 13  CREATININE 0.84 0.70  CALCIUM 8.2*  --    GFR: Estimated Creatinine Clearance: 44.5 mL/min (by C-G formula based on SCr of 0.7 mg/dL). Liver Function Tests: Recent Labs  Lab 04/01/23 1546  AST 24  ALT 8  ALKPHOS 27*  BILITOT 0.8  PROT 5.6*  ALBUMIN 3.2*   No results for input(s): "LIPASE", "AMYLASE" in the last 168 hours. No results for input(s): "AMMONIA" in the last 168 hours. Coagulation Profile: Recent Labs  Lab 04/01/23 1546  INR 1.0   Cardiac Enzymes: No results for input(s): "CKTOTAL", "CKMB", "CKMBINDEX", "TROPONINI" in the last 168 hours. BNP (last 3 results) No results for input(s): "PROBNP" in the last 8760 hours. HbA1C: No results for input(s): "HGBA1C" in the last 72 hours. CBG: Recent Labs  Lab 04/01/23 1545  GLUCAP 115*   Lipid Profile: No results for input(s): "CHOL", "HDL", "LDLCALC", "TRIG", "CHOLHDL", "LDLDIRECT" in the last 72 hours. Thyroid Function Tests: No results for input(s): "TSH", "T4TOTAL", "FREET4", "T3FREE", "THYROIDAB" in the last 72 hours. Anemia  Panel: No results for input(s): "VITAMINB12", "FOLATE", "FERRITIN", "TIBC", "IRON", "RETICCTPCT" in the last 72 hours. Urine analysis:    Component Value Date/Time   COLORURINE YELLOW 08/29/2012 1402   APPEARANCEUR CLOUDY (A) 08/29/2012 1402   LABSPEC 1.021 08/29/2012 1402   PHURINE 5.5 08/29/2012 1402   GLUCOSEU NEGATIVE 08/29/2012 1402   HGBUR NEGATIVE 08/29/2012 1402   BILIRUBINUR NEGATIVE 08/29/2012 1402   KETONESUR 15 (A) 08/29/2012 1402   PROTEINUR NEGATIVE 08/29/2012 1402   UROBILINOGEN 0.2 08/29/2012 1402   NITRITE NEGATIVE 08/29/2012 1402   LEUKOCYTESUR SMALL (A) 08/29/2012 1402    Radiological Exams on Admission: CT C-SPINE NO CHARGE  Result Date: 04/01/2023 CLINICAL DATA:  Seizures.  Found down. EXAM: CT CERVICAL  SPINE WITH CONTRAST TECHNIQUE: Multiplanar CT images of the cervical spine were reconstructed from contemporary CTA of the Neck. RADIATION DOSE REDUCTION: This exam was performed according to the departmental dose-optimization program which includes automated exposure control, adjustment of the mA and/or kV according to patient size and/or use of iterative reconstruction technique. CONTRAST:  No additional. COMPARISON:  None Available. FINDINGS: Alignment: Straightening of the normal cervical lordosis. Trace anterolisthesis of C3 on C4 and trace retrolisthesis of C5 on C6, likely degenerative. Skull base and vertebrae: No acute cervical spine fracture or suspicious osseous lesion. Mild compression deformity of the T3 superior endplate, favored to be chronic. Soft tissues and spinal canal: No prevertebral fluid or swelling. No visible canal hematoma. Disc levels: Moderate to severe disc degeneration from C3-4 through C6-7. Multilevel facet arthrosis, most severe on the left at C3-4 and C4-5. Mild-to-moderate spinal stenosis at C5-6 and C6-7. Moderate neural foraminal stenosis bilaterally at C5-6 and C6-7 and on the left at C3-4. Upper chest: No mass or consolidation in the  lung apices. Other: None. IMPRESSION: 1. No acute cervical spine fracture. 2. Advanced cervical disc and facet degeneration. Electronically Signed   By: Sebastian Ache M.D.   On: 04/01/2023 17:15   CT ANGIO HEAD NECK W WO CM W PERF (CODE STROKE)  Result Date: 04/01/2023 CLINICAL DATA:  Neuro deficit, acute, stroke suspected. Left-sided weakness. EXAM: CT ANGIOGRAPHY HEAD AND NECK CT PERFUSION BRAIN TECHNIQUE: Multidetector CT imaging of the head and neck was performed using the standard protocol during bolus administration of intravenous contrast. Multiplanar CT image reconstructions and MIPs were obtained to evaluate the vascular anatomy. Carotid stenosis measurements (when applicable) are obtained utilizing NASCET criteria, using the distal internal carotid diameter as the denominator. Multiphase CT imaging of the brain was performed following IV bolus contrast injection. Subsequent parametric perfusion maps were calculated using RAPID software. RADIATION DOSE REDUCTION: This exam was performed according to the departmental dose-optimization program which includes automated exposure control, adjustment of the mA and/or kV according to patient size and/or use of iterative reconstruction technique. CONTRAST:  OMNIPAQUE IOHEXOL 350 MG/ML SOLN COMPARISON:  CTA head 08/17/2010 FINDINGS: CTA NECK FINDINGS Aortic arch: Standard 3 vessel aortic arch with mild atherosclerotic plaque. No stenosis of the arch vessel origins. Right carotid system: Patent with predominantly calcified plaque at the carotid bifurcation resulting in severe stenosis of the ECA origin but no significant ICA or common carotid artery stenosis. Tortuous distal cervical ICA. Left carotid system: Patent with a small amount of calcified plaque at the carotid bifurcation. No significant stenosis. Vertebral arteries: Patent without evidence of stenosis or dissection. Mildly dominant right vertebral artery. Skeleton: Advanced cervical disc and facet  degeneration. Mild compression deformity of the T3 superior endplate, chronic in appearance. Other neck: No evidence of cervical lymphadenopathy or mass. Upper chest: No mass or consolidation in the included lung apices. Review of the MIP images confirms the above findings CTA HEAD FINDINGS Anterior circulation: The internal carotid arteries are widely patent from skull base to carotid termini with prominent tortuosity of the cavernous segments. There has been interval clipping of a right supraclinoid ICA aneurysm with some peripheral calcification of the treated aneurysm but no definite aneurysm opacification. ACAs and MCAs are patent without evidence of a proximal branch occlusion or significant proximal stenosis. Posterior circulation: The intracranial vertebral arteries are widely patent to the basilar. The basilar artery is widely patent. There is a large right posterior communicating artery with hypoplasia of the right P1 segment.  Both PCAs are patent without evidence of a significant proximal stenosis. No aneurysm is identified. Venous sinuses: As permitted by contrast timing, patent. Anatomic variants: Fetal right PCA.  Hypoplastic right A1 segment. Review of the MIP images confirms the above findings CT Brain Perfusion Findings: ASPECTS: 10 CBF (<30%) Volume: 0 mL Perfusion (Tmax>6.0s) volume: 0 mL Mismatch Volume: 0 mL Infarction Location: None identified by CTP. These results were communicated to Dr. Amada Jupiter at 4:37 pm on 04/01/2023 by text page via the [facility] messaging system. IMPRESSION: 1. No large vessel occlusion. 2. No evidence of acute infarct or ischemic penumbra on CTP. 3. Prior clipping of a right supraclinoid ICA aneurysm. 4. Cervical carotid atherosclerosis with severe stenosis of the right ECA origin but no significant common or internal carotid artery stenosis. 5.  Aortic Atherosclerosis (ICD10-I70.0). Electronically Signed   By: Sebastian Ache M.D.   On: 04/01/2023 16:48   EEG  adult  Result Date: 04/01/2023 Charlsie Quest, MD     04/01/2023  5:36 PM Patient Name: Jaime Barry MRN: [id number] Epilepsy Attending: Charlsie Quest Referring Physician/Provider: Mathews Argyle, NP Date: 04/01/2023 Duration: 22.40 mins Patient history: Patient BIB [facility] from home for focal seizures.EEG to evaluate for seizure Level of alertness: Awake AEDs during EEG study: VPA Technical aspects: This EEG study was done with scalp electrodes positioned according to the 10-20 International system of electrode placement. Electrical activity was reviewed with band pass filter of 1-70Hz , sensitivity of 7 uV/mm, display speed of 46mm/sec with a 60Hz  notched filter applied as appropriate. EEG data were recorded continuously and digitally stored.  Video monitoring was available and reviewed as appropriate. Description: The posterior dominant rhythm consists of 8.5 Hz activity of moderate voltage (25-35 uV) seen predominantly in posterior head regions, symmetric and reactive to eye opening and eye closing. EEG showed continuous 3 to 6 Hz theta-delta slowing with overriding 13-15hz  beta activity in right fronto-temporal region consistent with breach artifact.   Hyperventilation and photic stimulation were not performed.   ABNORMALITY - Breach artifact, right fronto-temporal region IMPRESSION: This study is suggestive of cortical dysfunction arising from right fronto-temporal region consistent with underlying craniotomy. No seizures or epileptiform discharges were seen throughout the recording. Charlsie Quest   CT HEAD CODE STROKE WO CONTRAST  Result Date: 04/01/2023 CLINICAL DATA:  Code stroke. Neuro deficit, acute, stroke suspected. Left-sided weakness. EXAM: CT HEAD WITHOUT CONTRAST TECHNIQUE: Contiguous axial images were obtained from the base of the skull through the vertex without intravenous contrast. RADIATION DOSE REDUCTION: This exam was performed according to the departmental dose-optimization  program which includes automated exposure control, adjustment of the mA and/or kV according to patient size and/or use of iterative reconstruction technique. COMPARISON:  Head CT 08/29/2012 and MRI 08/30/2012 FINDINGS: Brain: There is no evidence of an acute infarct, intracranial hemorrhage, mass, midline shift, or extra-axial fluid collection. Patchy hypodensities in the cerebral white matter bilaterally have progressed and are nonspecific but compatible with moderate chronic small vessel ischemic disease. There is progressive, mild-to-moderate cerebral atrophy. Vascular: Prior right ICA aneurysm clipping. Skull: Right frontotemporal craniotomy. Sinuses/Orbits: Minor mucosal thickening in the paranasal sinuses. Clear mastoid air cells. Unremarkable orbits. Other: None. ASPECTS (Alberta Stroke Program Early CT Score) - Ganglionic level infarction (caudate, lentiform nuclei, internal capsule, insula, M1-M3 cortex): 7 - Supraganglionic infarction (M4-M6 cortex): 3 Total score (0-10 with 10 being normal): 10 These results were communicated to Dr. Amada Jupiter at 4:07 pm on 04/01/2023 by text page via the [facility] messaging  system. IMPRESSION: 1. No evidence of acute intracranial abnormality. ASPECTS of 10. 2. Moderate chronic small vessel ischemic disease. Electronically Signed   By: Sebastian Ache M.D.   On: 04/01/2023 16:07    EKG: Independently reviewed.  Sinus, low voltage, no acute ST changes.  Assessment/Plan Principal Problem:   Seizure [facility]) Active Problems:   Alzheimer's disease (HCC)  (please populate well all problems here in Problem List. (For example, if patient is on BP meds at home and you resume or decide to hold them, it is a problem that needs to be her. Same for CAD, COPD, HLD and so on)  Seizure -Likely secondary to noncoherent with antiseizure medications -EEG result reviewed which showed cortical dysfunction arising from the right frontotemporal region correlated with underlying craniotomy  but no seizure activity detected. -Neurology consultation appreciated, agreed with increasing current antiseizure Depakote dosage to 500 mg 3 times daily (was BID before).  Will be in the form of IV for now until patient mentation improved. 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

## 2023-04-01 NOTE — ED Notes (Signed)
ED TO INPATIENT HANDOFF REPORT  ED Nurse Name and Phone #: [phone]  S Name/Age/Gender Jaime Barry 82 y.o. female Room/Bed: 023C/023C  Code Status   Code Status: Full Code  Home/SNF/Other Home Patient oriented to: self, place, time, and situation Is this baseline? Yes   Triage Complete: Triage complete  Chief Complaint Seizure [facility]) [R56.9]  Triage Note Patient BIB [facility] from home for focal seizures. Patient husband called EMS after hearing the patient screaming and when he checked on her she was on the ground. EMS reports focal like activity with them, hx of same. Delay in patient transport d/t status of the house. Patient unable to answer questions, usually able to communicate normally. Patient has hx of brain aneurysm as well. VSS, CBG 143, 94% RA   Allergies No Known Allergies  Level of Care/Admitting Diagnosis ED Disposition     ED Disposition  Admit   Condition  --   Comment  Hospital Area: [facility] [[id number]]  Level of Care: Telemetry Medical [104]  May place patient in observation at [facility] or [facility] if equivalent level of care is available:: No  Covid Evaluation: Asymptomatic - no recent exposure (last 10 days) testing not required  Diagnosis: Seizure [facility]) [[id number]]  Admitting Physician: Emeline General [[id number]]  Attending Physician: Emeline General [[id number]]          B Medical/Surgery History Past Medical History:  Diagnosis Date   Altered mental state 08/29/2012   Dementia (HCC)    Dementia (HCC)    progressive    H/O cerebral aneurysm repair    in December of 2011   Lower leg edema    Osteoporosis    Seizure (HCC)    Seizures (HCC) 08/29/2012   Spinal stenosis    Past Surgical History:  Procedure Laterality Date   artery aneurysm clipping Right    CEREBRAL ANEURYSM REPAIR  09/2010   paraophthalmic artery /note 08/29/2012   LUMBAR PUNCTURE  08/29/2012   TONSILLECTOMY       A IV Location/Drains/Wounds Patient  Lines/Drains/Airways Status     Active Line/Drains/Airways     Name Placement date Placement time Site Days   Peripheral IV 04/01/23 18 G Anterior;Left;Proximal Forearm 04/01/23  1538  Forearm  less than 1            Intake/Output Last 24 hours No intake or output data in the 24 hours ending 04/01/23 1953  Labs/Imaging Results for orders placed or performed during the hospital encounter of 04/01/23 (from the past 48 hour(s))  CBG monitoring, ED     Status: Abnormal   Collection Time: 04/01/23  3:45 PM  Result Value Ref Range   Glucose-Capillary 115 (H) 70 - 99 mg/dL    Comment: Glucose reference range applies only to samples taken after fasting for at least 8 hours.  Valproic acid level     Status: Abnormal   Collection Time: 04/01/23  3:46 PM  Result Value Ref Range   Valproic Acid Lvl <10 (L) 50.0 - 100.0 ug/mL    Comment: RESULT CONFIRMED BY MANUAL DILUTION CORRECTED RESULTS CALLED TO: Cena Benton RN @1735  04/01/23 E,[place] Performed at [facility], [address] [place], [place] [address] CORRECTED ON 06/30 AT 1736: PREVIOUSLY REPORTED AS 116 RESULT CONFIRMED BY MANUAL DILUTION   Ethanol     Status: None   Collection Time: 04/01/23  3:46 PM  Result Value Ref Range   Alcohol, Ethyl (B) <10 <10 mg/dL  Comment: (NOTE) Lowest detectable limit for serum alcohol is 10 mg/dL.  For medical purposes only. Performed at [facility], [address] [place], [place] [address]   Protime-INR     Status: None   Collection Time: 04/01/23  3:46 PM  Result Value Ref Range   Prothrombin Time 13.5 11.4 - 15.2 seconds   INR 1.0 0.8 - 1.2    Comment: (NOTE) INR goal varies based on device and disease states. Performed at [facility], [address], [place], [place] [address]   APTT     Status: None   Collection Time: 04/01/23  3:46 PM  Result Value Ref Range   aPTT 25 24 - 36 seconds    Comment: Performed at [facility], [address]  [place], [place] [address]  CBC     Status: Abnormal   Collection Time: 04/01/23  3:46 PM  Result Value Ref Range   WBC 4.6 4.0 - 10.5 K/uL   RBC 3.67 (L) 3.87 - 5.11 MIL/uL   Hemoglobin 11.7 (L) 12.0 - 15.0 g/dL   HCT 91.4 78.2 - 95.6 %   MCV 103.5 (H) 80.0 - 100.0 fL   MCH 31.9 26.0 - 34.0 pg   MCHC 30.8 30.0 - 36.0 g/dL   RDW 21.3 08.6 - 57.8 %   Platelets 136 (L) 150 - 400 K/uL    Comment: REPEATED TO VERIFY   nRBC 0.0 0.0 - 0.2 %    Comment: Performed at [facility], [address] [place], [place] [address]  Differential     Status: None   Collection Time: 04/01/23  3:46 PM  Result Value Ref Range   Neutrophils Relative % 74 %   Neutro Abs 3.5 1.7 - 7.7 K/uL   Lymphocytes Relative 19 %   Lymphs Abs 0.9 0.7 - 4.0 K/uL   Monocytes Relative 4 %   Monocytes Absolute 0.2 0.1 - 1.0 K/uL   Eosinophils Relative 0 %   Eosinophils Absolute 0.0 0.0 - 0.5 K/uL   Basophils Relative 1 %   Basophils Absolute 0.0 0.0 - 0.1 K/uL   Immature Granulocytes 2 %   Abs Immature Granulocytes 0.07 0.00 - 0.07 K/uL    Comment: Performed at [facility], [address], [place], [place] [address]  Comprehensive metabolic panel     Status: Abnormal   Collection Time: 04/01/23  3:46 PM  Result Value Ref Range   Sodium 138 135 - 145 mmol/L   Potassium 3.9 3.5 - 5.1 mmol/L   Chloride 109 98 - 111 mmol/L   CO2 20 (L) 22 - 32 mmol/L   Glucose, Bld 129 (H) 70 - 99 mg/dL    Comment: Glucose reference range applies only to samples taken after fasting for at least 8 hours.   BUN 13 8 - 23 mg/dL   Creatinine, Ser 1.32 0.44 - 1.00 mg/dL   Calcium 8.2 (L) 8.9 - 10.3 mg/dL   Total Protein 5.6 (L) 6.5 - 8.1 g/dL   Albumin 3.2 (L) 3.5 - 5.0 g/dL   AST 24 15 - 41 U/L   ALT 8 0 - 44 U/L   Alkaline Phosphatase 27 (L) 38 - 126 U/L   Total Bilirubin 0.8 0.3 - 1.2 mg/dL   GFR, Estimated >44 >01 mL/min    Comment: (NOTE) Calculated using the CKD-EPI Creatinine Equation (2021)    Anion gap 9 5 - 15     Comment: Performed  at [facility], [address], [place], [place] [address]  I-stat chem 8, ED     Status: Abnormal   Collection Time: 04/01/23  3:49 PM  Result Value Ref Range   Sodium 141 135 - 145 mmol/L   Potassium 3.5 3.5 - 5.1 mmol/L   Chloride 109 98 - 111 mmol/L   BUN 13 8 - 23 mg/dL   Creatinine, Ser 1.30 0.44 - 1.00 mg/dL   Glucose, Bld 865 (H) 70 - 99 mg/dL    Comment: Glucose reference range applies only to samples taken after fasting for at least 8 hours.   Calcium, Ion 0.99 (L) 1.15 - 1.40 mmol/L   TCO2 22 22 - 32 mmol/L   Hemoglobin 12.6 12.0 - 15.0 g/dL   HCT 78.4 69.6 - 29.5 %  SARS Coronavirus 2 by RT PCR (hospital order, performed in [facility] hospital lab) *cepheid single result test* Anterior Nasal Swab     Status: None   Collection Time: 04/01/23  4:30 PM   Specimen: Anterior Nasal Swab  Result Value Ref Range   SARS Coronavirus 2 by RT PCR NEGATIVE NEGATIVE    Comment: Performed at [facility], [address], [place], [place] [address]   MR BRAIN WO CONTRAST  Result Date: 04/01/2023 CLINICAL DATA:  Stroke suspected EXAM: MRI HEAD WITHOUT CONTRAST TECHNIQUE: Multiplanar, multiecho pulse sequences of the brain and surrounding structures were obtained without intravenous contrast. COMPARISON:  08/30/2012 MRI head, correlation is also made with CT head 04/01/2023 FINDINGS: Evaluation is mildly limited by susceptibility artifact from the patient's right ICA aneurysm clip. Brain: No restricted diffusion to suggest acute or subacute infarct. No acute hemorrhage, mass, mass effect, or midline shift. No hydrocephalus or extra-axial collection. Normal pituitary and craniocervical junction. No hemosiderin deposition to suggest remote hemorrhage. Age related cerebral atrophy. Confluent T2 hyperintense signal in the periventricular white matter, likely the sequela of moderate chronic small vessel ischemic disease. Vascular: Normal arterial flow voids.  Susceptibility from right ICA aneurysm clips. Skull and upper cervical spine: Normal marrow signal. Sequela of prior right frontotemporal craniotomy. Sinuses/Orbits: Mucosal thickening ethmoid air cells. No acute finding in the orbits. Other: The mastoid air cells are well aerated. IMPRESSION: No acute intracranial process. No evidence of acute or subacute infarct. Electronically Signed   By: Wiliam Ke M.D.   On: 04/01/2023 19:22   CT C-SPINE NO CHARGE  Result Date: 04/01/2023 CLINICAL DATA:  Seizures.  Found down. EXAM: CT CERVICAL SPINE WITH CONTRAST TECHNIQUE: Multiplanar CT images of the cervical spine were reconstructed from contemporary CTA of the Neck. RADIATION DOSE REDUCTION: This exam was performed according to the departmental dose-optimization program which includes automated exposure control, adjustment of the mA and/or kV according to patient size and/or use of iterative reconstruction technique. CONTRAST:  No additional. COMPARISON:  None Available. FINDINGS: Alignment: Straightening of the normal cervical lordosis. Trace anterolisthesis of C3 on C4 and trace retrolisthesis of C5 on C6, likely degenerative. Skull base and vertebrae: No acute cervical spine fracture or suspicious osseous lesion. Mild compression deformity of the T3 superior endplate, favored to be chronic. Soft tissues and spinal canal: No prevertebral fluid or swelling. No visible canal hematoma. Disc levels: Moderate to severe disc degeneration from C3-4 through C6-7. Multilevel facet arthrosis, most severe on the left at C3-4 and C4-5. Mild-to-moderate spinal stenosis at C5-6 and C6-7. Moderate neural foraminal stenosis bilaterally at C5-6 and C6-7 and on the left at C3-4. Upper chest: No  mass or consolidation in the lung apices. Other: None. IMPRESSION: 1. No acute cervical spine fracture. 2. Advanced cervical disc and facet degeneration. Electronically Signed   By: Sebastian Ache M.D.   On: 04/01/2023 17:15   CT ANGIO HEAD  NECK W WO CM W PERF (CODE STROKE)  Result Date: 04/01/2023 CLINICAL DATA:  Neuro deficit, acute, stroke suspected. Left-sided weakness. EXAM: CT ANGIOGRAPHY HEAD AND NECK CT PERFUSION BRAIN TECHNIQUE: Multidetector CT imaging of the head and neck was performed using the standard protocol during bolus administration of intravenous contrast. Multiplanar CT image reconstructions and MIPs were obtained to evaluate the vascular anatomy. Carotid stenosis measurements (when applicable) are obtained utilizing NASCET criteria, using the distal internal carotid diameter as the denominator. Multiphase CT imaging of the brain was performed following IV bolus contrast injection. Subsequent parametric perfusion maps were calculated using RAPID software. RADIATION DOSE REDUCTION: This exam was performed according to the departmental dose-optimization program which includes automated exposure control, adjustment of the mA and/or kV according to patient size and/or use of iterative reconstruction technique. CONTRAST:  OMNIPAQUE IOHEXOL 350 MG/ML SOLN COMPARISON:  CTA head 08/17/2010 FINDINGS: CTA NECK FINDINGS Aortic arch: Standard 3 vessel aortic arch with mild atherosclerotic plaque. No stenosis of the arch vessel origins. Right carotid system: Patent with predominantly calcified plaque at the carotid bifurcation resulting in severe stenosis of the ECA origin but no significant ICA or common carotid artery stenosis. Tortuous distal cervical ICA. Left carotid system: Patent with a small amount of calcified plaque at the carotid bifurcation. No significant stenosis. Vertebral arteries: Patent without evidence of stenosis or dissection. Mildly dominant right vertebral artery. Skeleton: Advanced cervical disc and facet degeneration. Mild compression deformity of the T3 superior endplate, chronic in appearance. Other neck: No evidence of cervical lymphadenopathy or mass. Upper chest: No mass or consolidation in the included  lung apices. Review of the MIP images confirms the above findings CTA HEAD FINDINGS Anterior circulation: The internal carotid arteries are widely patent from skull base to carotid termini with prominent tortuosity of the cavernous segments. There has been interval clipping of a right supraclinoid ICA aneurysm with some peripheral calcification of the treated aneurysm but no definite aneurysm opacification. ACAs and MCAs are patent without evidence of a proximal branch occlusion or significant proximal stenosis. Posterior circulation: The intracranial vertebral arteries are widely patent to the basilar. The basilar artery is widely patent. There is a large right posterior communicating artery with hypoplasia of the right P1 segment. Both PCAs are patent without evidence of a significant proximal stenosis. No aneurysm is identified. Venous sinuses: As permitted by contrast timing, patent. Anatomic variants: Fetal right PCA.  Hypoplastic right A1 segment. Review of the MIP images confirms the above findings CT Brain Perfusion Findings: ASPECTS: 10 CBF (<30%) Volume: 0 mL Perfusion (Tmax>6.0s) volume: 0 mL Mismatch Volume: 0 mL Infarction Location: None identified by CTP. These results were communicated to Dr. Amada Jupiter at 4:37 pm on 04/01/2023 by text page via the [facility] messaging system. IMPRESSION: 1. No large vessel occlusion. 2. No evidence of acute infarct or ischemic penumbra on CTP. 3. Prior clipping of a right supraclinoid ICA aneurysm. 4. Cervical carotid atherosclerosis with severe stenosis of the right ECA origin but no significant common or internal carotid artery stenosis. 5.  Aortic Atherosclerosis (ICD10-I70.0). Electronically Signed   By: Sebastian Ache M.D.   On: 04/01/2023 16:48   EEG adult  Result Date: 04/01/2023 Charlsie Quest, MD     04/01/2023  5:36 PM Patient Name: MILAYNA AMERSON MRN: [id number] Epilepsy Attending: Charlsie Quest Referring Physician/Provider: Mathews Argyle, NP Date:  04/01/2023 Duration: 22.40 mins Patient history: Patient BIB [facility] from home for focal seizures.EEG to evaluate for seizure Level of alertness: Awake AEDs during EEG study: VPA Technical aspects: This EEG study was done with scalp electrodes positioned according to the 10-20 International system of electrode placement. Electrical activity was reviewed with band pass filter of 1-70Hz , sensitivity of 7 uV/mm, display speed of 24mm/sec with a 60Hz  notched filter applied as appropriate. EEG data were recorded continuously and digitally stored.  Video monitoring was available and reviewed as appropriate. Description: The posterior dominant rhythm consists of 8.5 Hz activity of moderate voltage (25-35 uV) seen predominantly in posterior head regions, symmetric and reactive to eye opening and eye closing. EEG showed continuous 3 to 6 Hz theta-delta slowing with overriding 13-15hz  beta activity in right fronto-temporal region consistent with breach artifact.   Hyperventilation and photic stimulation were not performed.   ABNORMALITY - Breach artifact, right fronto-temporal region IMPRESSION: This study is suggestive of cortical dysfunction arising from right fronto-temporal region consistent with underlying craniotomy. No seizures or epileptiform discharges were seen throughout the recording. Charlsie Quest   CT HEAD CODE STROKE WO CONTRAST  Result Date: 04/01/2023 CLINICAL DATA:  Code stroke. Neuro deficit, acute, stroke suspected. Left-sided weakness. EXAM: CT HEAD WITHOUT CONTRAST TECHNIQUE: Contiguous axial images were obtained from the base of the skull through the vertex without intravenous contrast. RADIATION DOSE REDUCTION: This exam was performed according to the departmental dose-optimization program which includes automated exposure control, adjustment of the mA and/or kV according to patient size and/or use of iterative reconstruction technique. COMPARISON:  Head CT 08/29/2012 and MRI 08/30/2012 FINDINGS:  Brain: There is no evidence of an acute infarct, intracranial hemorrhage, mass, midline shift, or extra-axial fluid collection. Patchy hypodensities in the cerebral white matter bilaterally have progressed and are nonspecific but compatible with moderate chronic small vessel ischemic disease. There is progressive, mild-to-moderate cerebral atrophy. Vascular: Prior right ICA aneurysm clipping. Skull: Right frontotemporal craniotomy. Sinuses/Orbits: Minor mucosal thickening in the paranasal sinuses. Clear mastoid air cells. Unremarkable orbits. Other: None. ASPECTS (Alberta Stroke Program Early CT Score) - Ganglionic level infarction (caudate, lentiform nuclei, internal capsule, insula, M1-M3 cortex): 7 - Supraganglionic infarction (M4-M6 cortex): 3 Total score (0-10 with 10 being normal): 10 These results were communicated to Dr. Amada Jupiter at 4:07 pm on 04/01/2023 by text page via the [facility] messaging system. IMPRESSION: 1. No evidence of acute intracranial abnormality. ASPECTS of 10. 2. Moderate chronic small vessel ischemic disease. Electronically Signed   By: Sebastian Ache M.D.   On: 04/01/2023 16:07    Pending Labs Unresulted Labs (From admission, onward)     Start     Ordered   04/01/23 1542  Urine rapid drug screen (hosp performed)  Once,   STAT        04/01/23 1542   04/01/23 1542  Urinalysis, Routine w reflex microscopic -Urine, Clean Catch  Once,   URGENT       Question:  Specimen Source  Answer:  Urine, Clean Catch   04/01/23 1542            Vitals/Pain Today's Vitals   04/01/23 1800 04/01/23 1915 04/01/23 1930 04/01/23 1945  BP: (!) 113/58 (!) 111/94 (!) 93/50   Pulse: 70 69 69 84  Resp: 16 11 (!) 9 16  Temp:  98.1 F (36.7 C)  SpO2: 98% 99% 99% 99%  Weight:      Height:        Isolation Precautions Airborne and Contact precautions  Medications Medications  valproate (DEPACON) 1,500 mg in dextrose 5 % 50 mL IVPB (1,500 mg Intravenous New Bag/Given 04/01/23 1950)   valproate (DEPACON) 500 mg in dextrose 5 % 50 mL IVPB (has no administration in time range)  Oral care mouth rinse (15 mLs Mouth Rinse Patient Refused/Not Given 04/01/23 1926)  Oral care mouth rinse (has no administration in time range)  0.9 %  sodium chloride infusion (75 mL/hr Intravenous New Bag/Given 04/01/23 1924)  enoxaparin (LOVENOX) injection 40 mg (40 mg Subcutaneous Patient Refused/Not Given 04/01/23 1926)  LORazepam (ATIVAN) injection 4 mg (has no administration in time range)  memantine (NAMENDA XR) 24 hr capsule 21 mg (has no administration in time range)  iohexol (OMNIPAQUE) 350 MG/ML injection 100 mL (100 mLs Intravenous Contrast Given 04/01/23 1626)    Mobility Have not gotten up since being here     Focused Assessments Neuro Assessment Handoff:  Swallow screen pass? Yes  Cardiac Rhythm: Normal sinus rhythm NIH Stroke Scale  Dizziness Present:  (uta) Headache Present:  (uta) Interval: Initial Level of Consciousness (1a.)   : Alert, keenly responsive LOC Questions (1b. )   : Answers neither question correctly LOC Commands (1c. )   : Performs both tasks correctly Best Gaze (2. )  : Forced deviation Visual (3. )  : Partial hemianopia Facial Palsy (4. )    : Minor paralysis Motor Arm, Left (5a. )   : No movement Motor Arm, Right (5b. ) : No drift Motor Leg, Left (6a. )  : No movement Motor Leg, Right (6b. ) : Drift Limb Ataxia (7. ): Absent Sensory (8. )  : Severe to total sensory loss, patient is not aware of being touched in the face, arm, and leg Best Language (9. )  : Mute, global aphasia Dysarthria (10. ): Severe dysarthria, patient's speech is so slurred as to be unintelligible in the absence of or out of proportion to any dysphasia, or is mute/anarthric Extinction/Inattention (11.)   : Profound hemi-inattention or extinction to more than one modality Complete NIHSS TOTAL: 24 Last date known well: 04/01/23 Last time known well: 0700 Neuro Assessment:   Neuro  Checks:   Initial (04/01/23 1600)  Has TPA been given? No If patient is a Neuro Trauma and patient is going to OR before floor call report to 4N Charge nurse: [phone] or [phone]   R Recommendations: See Admitting Provider Note  Report given to:   Additional Notes: here for seizures / was having tod paralysis / alert and oriented x 4 now / frustrated, wants to go home / continent / on RA

## 2023-04-01 NOTE — Code Documentation (Addendum)
Stroke Response Nurse Documentation Code Documentation  Jaime Barry is a 82 y.o. female arriving to [facility]  via [facility] on 04/01/23 with past medical hx of dementia, cerebral aneurysm with clipping 2011, seizure. On No antithrombotic. Code stroke was activated by ED.   Patient from home with husband where they've both been ill with a respiratory infection. LKW 0700 when husband last spoke and saw patient as they have both been napping off/on the remainder of the day. Approximately 1445 he heard her cry out and found her on the floor. EMS noted seizure like activity and versed given. Code stroke activated by EDP as patient with left side flaccid, mute, severe left neglect and right gaze. Once at hospital, more detail obtained from husband: he noted patient twitching on right side only.   Labs drawn and patient cleared for CT by Dr. Anitra Lauth. Stroke team at the bedside on patient activation in CT. NIHSS 24, see flowsheet for details. CT, CTA/P completed, delay in CTA/P due to injector issues. Patient is not a candidate for IV Thrombolytic due to out of window. Patient is not a candidate for IR due to no LVO.   Care Plan: q2h NIH/vitals. Bedside handoff with ED RN Paden.    Scarlette Slice K  Rapid Response RN

## 2023-04-01 NOTE — ED Notes (Signed)
Patient transported to CT with RN 

## 2023-04-01 NOTE — ED Notes (Signed)
Patient transported to MRI 

## 2023-04-01 NOTE — ED Provider Notes (Signed)
[facility] AT [facility] Provider Note   CSN: [id number] Arrival date & time: 04/01/23  1523  An emergency department physician performed an initial assessment on this suspected stroke patient at 1546.  History  Chief Complaint  Patient presents with   Code Stroke    ROYALLE FLAD is a 82 y.o. female.  Patient is a 82 year old female with a history of cerebral aneurysm status post repair, seizure disorder on Depakote, dementia who is being brought in today by EMS due to concern for a fall and possible seizure.  EMS reports that her husband called because she was yelling his name and had fallen in their home approximately 40 minutes before her arrival to the emergency room.  EMS reports when they arrived patient seemed to be staring to the right and was not answering a lot of questions.  They were concerned she may still be seen and gave her 5 mg of IM Versed.  They did not notice any focal tremors or shaking.  Blood sugar was normal.  She has been hemodynamically stable.  No report of any blood thinners.  Patient cannot give history.  The history is provided by the EMS personnel and medical records. The history is limited by the condition of the patient.       Home Medications Prior to Admission medications   Medication Sig Start Date End Date Taking? Authorizing Provider  Calcium 600-200 MG-UNIT tablet Take 1 tablet by mouth daily.    [provider]  denosumab (PROLIA) 60 MG/ML SOLN injection Inject 60 mg into the skin every 6 (six) months. Administer in upper arm, thigh, or abdomen    [provider]  divalproex (DEPAKOTE ER) 500 MG 24 hr tablet Take 500 mg by mouth 2 (two) times daily.  03/22/15   [provider]  memantine (NAMENDA XR) 21 MG CP24 24 hr capsule TAKE 1 CAPSULE(21 MG) BY MOUTH DAILY 01/31/23   Butch Penny, NP      Allergies    Patient has no known allergies.    Review of Systems   Review of  Systems  Physical Exam Updated Vital Signs BP (!) 113/58   Pulse 70   Resp 16   Ht 5' (1.524 m)   Wt 55.3 kg Comment: previously reported weight in 03/24  SpO2 98%   BMI 23.83 kg/m  Physical Exam Vitals and nursing note reviewed.  Constitutional:      General: She is not in acute distress.    Appearance: She is well-developed.  HENT:     Head: Normocephalic and atraumatic.  Eyes:     Pupils: Pupils are equal, round, and reactive to light.  Cardiovascular:     Rate and Rhythm: Normal rate and regular rhythm.     Heart sounds: Normal heart sounds. No murmur heard.    No friction rub.  Pulmonary:     Effort: Pulmonary effort is normal.     Breath sounds: Normal breath sounds. No wheezing or rales.  Abdominal:     General: Bowel sounds are normal. There is no distension.     Palpations: Abdomen is soft.     Tenderness: There is no abdominal tenderness. There is no guarding or rebound.  Musculoskeletal:        General: No tenderness. Normal range of motion.     Comments: No edema  Skin:    General: Skin is warm and dry.     Findings: No rash.  Neurological:  Mental Status: She is alert.     Comments: Patient is staring to the right but is interactive.  She will smile when you talk with her and she will follow commands.  Patient has significant weakness and flaccid paralysis in the left upper and lower extremity.  Neglect to the left.  Patient does have 5 out of 5 strength in the right upper extremity and 4 out of 5 in the right lower extremity.  She will grunt or say yes but does not use any other words.  Psychiatric:        Behavior: Behavior normal.     ED Results / Procedures / Treatments   Labs (all labs ordered are listed, but only abnormal results are displayed) Labs Reviewed  VALPROIC ACID LEVEL - Abnormal; Notable for the following components:      Result Value   Valproic Acid Lvl <10 (*)    All other components within normal limits  CBC - Abnormal; Notable  for the following components:   RBC 3.67 (*)    Hemoglobin 11.7 (*)    MCV 103.5 (*)    Platelets 136 (*)    All other components within normal limits  COMPREHENSIVE METABOLIC PANEL - Abnormal; Notable for the following components:   CO2 20 (*)    Glucose, Bld 129 (*)    Calcium 8.2 (*)    Total Protein 5.6 (*)    Albumin 3.2 (*)    Alkaline Phosphatase 27 (*)    All other components within normal limits  I-STAT CHEM 8, ED - Abnormal; Notable for the following components:   Glucose, Bld 127 (*)    Calcium, Ion 0.99 (*)    All other components within normal limits  CBG MONITORING, ED - Abnormal; Notable for the following components:   Glucose-Capillary 115 (*)    All other components within normal limits  SARS CORONAVIRUS 2 BY RT PCR  ETHANOL  PROTIME-INR  APTT  DIFFERENTIAL  RAPID URINE DRUG SCREEN, HOSP PERFORMED  URINALYSIS, ROUTINE W REFLEX MICROSCOPIC    EKG EKG Interpretation Date/Time:  Sunday April 01 2023 15:42:16 EDT Ventricular Rate:  88 PR Interval:  118 QRS Duration:  125 QT Interval:  386 QTC Calculation: 467 R Axis:   77  Text Interpretation: Sinus rhythm Borderline short PR interval IVCD, consider atypical RBBB No previous tracing Confirmed by Gwyneth Sprout ([id number]) on 04/01/2023 3:51:55 PM  Radiology CT C-SPINE NO CHARGE  Result Date: 04/01/2023 CLINICAL DATA:  Seizures.  Found down. EXAM: CT CERVICAL SPINE WITH CONTRAST TECHNIQUE: Multiplanar CT images of the cervical spine were reconstructed from contemporary CTA of the Neck. RADIATION DOSE REDUCTION: This exam was performed according to the departmental dose-optimization program which includes automated exposure control, adjustment of the mA and/or kV according to patient size and/or use of iterative reconstruction technique. CONTRAST:  No additional. COMPARISON:  None Available. FINDINGS: Alignment: Straightening of the normal cervical lordosis. Trace anterolisthesis of C3 on C4 and trace  retrolisthesis of C5 on C6, likely degenerative. Skull base and vertebrae: No acute cervical spine fracture or suspicious osseous lesion. Mild compression deformity of the T3 superior endplate, favored to be chronic. Soft tissues and spinal canal: No prevertebral fluid or swelling. No visible canal hematoma. Disc levels: Moderate to severe disc degeneration from C3-4 through C6-7. Multilevel facet arthrosis, most severe on the left at C3-4 and C4-5. Mild-to-moderate spinal stenosis at C5-6 and C6-7. Moderate neural foraminal stenosis bilaterally at C5-6 and C6-7 and on the left  at C3-4. Upper chest: No mass or consolidation in the lung apices. Other: None. IMPRESSION: 1. No acute cervical spine fracture. 2. Advanced cervical disc and facet degeneration. Electronically Signed   By: Sebastian Ache M.D.   On: 04/01/2023 17:15   CT ANGIO HEAD NECK W WO CM W PERF (CODE STROKE)  Result Date: 04/01/2023 CLINICAL DATA:  Neuro deficit, acute, stroke suspected. Left-sided weakness. EXAM: CT ANGIOGRAPHY HEAD AND NECK CT PERFUSION BRAIN TECHNIQUE: Multidetector CT imaging of the head and neck was performed using the standard protocol during bolus administration of intravenous contrast. Multiplanar CT image reconstructions and MIPs were obtained to evaluate the vascular anatomy. Carotid stenosis measurements (when applicable) are obtained utilizing NASCET criteria, using the distal internal carotid diameter as the denominator. Multiphase CT imaging of the brain was performed following IV bolus contrast injection. Subsequent parametric perfusion maps were calculated using RAPID software. RADIATION DOSE REDUCTION: This exam was performed according to the departmental dose-optimization program which includes automated exposure control, adjustment of the mA and/or kV according to patient size and/or use of iterative reconstruction technique. CONTRAST:  OMNIPAQUE IOHEXOL 350 MG/ML SOLN COMPARISON:  CTA head 08/17/2010  FINDINGS: CTA NECK FINDINGS Aortic arch: Standard 3 vessel aortic arch with mild atherosclerotic plaque. No stenosis of the arch vessel origins. Right carotid system: Patent with predominantly calcified plaque at the carotid bifurcation resulting in severe stenosis of the ECA origin but no significant ICA or common carotid artery stenosis. Tortuous distal cervical ICA. Left carotid system: Patent with a small amount of calcified plaque at the carotid bifurcation. No significant stenosis. Vertebral arteries: Patent without evidence of stenosis or dissection. Mildly dominant right vertebral artery. Skeleton: Advanced cervical disc and facet degeneration. Mild compression deformity of the T3 superior endplate, chronic in appearance. Other neck: No evidence of cervical lymphadenopathy or mass. Upper chest: No mass or consolidation in the included lung apices. Review of the MIP images confirms the above findings CTA HEAD FINDINGS Anterior circulation: The internal carotid arteries are widely patent from skull base to carotid termini with prominent tortuosity of the cavernous segments. There has been interval clipping of a right supraclinoid ICA aneurysm with some peripheral calcification of the treated aneurysm but no definite aneurysm opacification. ACAs and MCAs are patent without evidence of a proximal branch occlusion or significant proximal stenosis. Posterior circulation: The intracranial vertebral arteries are widely patent to the basilar. The basilar artery is widely patent. There is a large right posterior communicating artery with hypoplasia of the right P1 segment. Both PCAs are patent without evidence of a significant proximal stenosis. No aneurysm is identified. Venous sinuses: As permitted by contrast timing, patent. Anatomic variants: Fetal right PCA.  Hypoplastic right A1 segment. Review of the MIP images confirms the above findings CT Brain Perfusion Findings: ASPECTS: 10 CBF (<30%) Volume: 0 mL  Perfusion (Tmax>6.0s) volume: 0 mL Mismatch Volume: 0 mL Infarction Location: None identified by CTP. These results were communicated to Dr. Amada Jupiter at 4:37 pm on 04/01/2023 by text page via the [facility] messaging system. IMPRESSION: 1. No large vessel occlusion. 2. No evidence of acute infarct or ischemic penumbra on CTP. 3. Prior clipping of a right supraclinoid ICA aneurysm. 4. Cervical carotid atherosclerosis with severe stenosis of the right ECA origin but no significant common or internal carotid artery stenosis. 5.  Aortic Atherosclerosis (ICD10-I70.0). Electronically Signed   By: Sebastian Ache M.D.   On: 04/01/2023 16:48   EEG adult  Result Date: 04/01/2023 Charlsie Quest, MD  04/01/2023  5:36 PM Patient Name: Jaime Barry MRN: [id number] Epilepsy Attending: Charlsie Quest Referring Physician/Provider: Mathews Argyle, NP Date: 04/01/2023 Duration: 22.40 mins Patient history: Patient BIB [facility] from home for focal seizures.EEG to evaluate for seizure Level of alertness: Awake AEDs during EEG study: VPA Technical aspects: This EEG study was done with scalp electrodes positioned according to the 10-20 International system of electrode placement. Electrical activity was reviewed with band pass filter of 1-70Hz , sensitivity of 7 uV/mm, display speed of 49mm/sec with a 60Hz  notched filter applied as appropriate. EEG data were recorded continuously and digitally stored.  Video monitoring was available and reviewed as appropriate. Description: The posterior dominant rhythm consists of 8.5 Hz activity of moderate voltage (25-35 uV) seen predominantly in posterior head regions, symmetric and reactive to eye opening and eye closing. EEG showed continuous 3 to 6 Hz theta-delta slowing with overriding 13-15hz  beta activity in right fronto-temporal region consistent with breach artifact.   Hyperventilation and photic stimulation were not performed.   ABNORMALITY - Breach artifact, right fronto-temporal region  IMPRESSION: This study is suggestive of cortical dysfunction arising from right fronto-temporal region consistent with underlying craniotomy. No seizures or epileptiform discharges were seen throughout the recording. Charlsie Quest   CT HEAD CODE STROKE WO CONTRAST  Result Date: 04/01/2023 CLINICAL DATA:  Code stroke. Neuro deficit, acute, stroke suspected. Left-sided weakness. EXAM: CT HEAD WITHOUT CONTRAST TECHNIQUE: Contiguous axial images were obtained from the base of the skull through the vertex without intravenous contrast. RADIATION DOSE REDUCTION: This exam was performed according to the departmental dose-optimization program which includes automated exposure control, adjustment of the mA and/or kV according to patient size and/or use of iterative reconstruction technique. COMPARISON:  Head CT 08/29/2012 and MRI 08/30/2012 FINDINGS: Brain: There is no evidence of an acute infarct, intracranial hemorrhage, mass, midline shift, or extra-axial fluid collection. Patchy hypodensities in the cerebral white matter bilaterally have progressed and are nonspecific but compatible with moderate chronic small vessel ischemic disease. There is progressive, mild-to-moderate cerebral atrophy. Vascular: Prior right ICA aneurysm clipping. Skull: Right frontotemporal craniotomy. Sinuses/Orbits: Minor mucosal thickening in the paranasal sinuses. Clear mastoid air cells. Unremarkable orbits. Other: None. ASPECTS (Alberta Stroke Program Early CT Score) - Ganglionic level infarction (caudate, lentiform nuclei, internal capsule, insula, M1-M3 cortex): 7 - Supraganglionic infarction (M4-M6 cortex): 3 Total score (0-10 with 10 being normal): 10 These results were communicated to Dr. Amada Jupiter at 4:07 pm on 04/01/2023 by text page via the [facility] messaging system. IMPRESSION: 1. No evidence of acute intracranial abnormality. ASPECTS of 10. 2. Moderate chronic small vessel ischemic disease. Electronically Signed   By: Sebastian Ache M.D.   On: 04/01/2023 16:07    Procedures Procedures    Medications Ordered in ED Medications  valproate (DEPACON) 1,500 mg in dextrose 5 % 50 mL IVPB (has no administration in time range)  valproate (DEPACON) 500 mg in dextrose 5 % 50 mL IVPB (has no administration in time range)  iohexol (OMNIPAQUE) 350 MG/ML injection 100 mL (100 mLs Intravenous Contrast Given 04/01/23 1626)    ED Course/ Medical Decision Making/ A&P                             Medical Decision Making Amount and/or Complexity of Data Reviewed Independent Historian: spouse and EMS External Data Reviewed: notes. Labs: ordered. Decision-making details documented in ED Course. Radiology: ordered and independent interpretation performed. Decision-making details documented in  ED Course. ECG/medicine tests: ordered and independent interpretation performed. Decision-making details documented in ED Course.  Risk Decision regarding hospitalization.   Pt with multiple medical problems and comorbidities and presenting today with a complaint that caries a high risk for morbidity and mortality.  Patient presented today with EMS after concern for fall at home and possible seizure.  However upon arrival here and on exam does not appear to be seizing is having purposeful movement but seems to have left hemineglect and left-sided upper and lower extremity flaccid paralysis.  Concern for code stroke.  Patient was walking mentating and speaking until she fell which was approximately 45 minutes to an hour before arrival.  She is LVO positive which puts her in the window.  Code stroke was called.  Patient's blood sugar was normal, vital signs have been reassuring.  Patient went to the CT scanner.  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  She is on Depakote for this and the level was checked in addition to code stroke labs.  Dr. Amada Jupiter  with neurology arrived and examined patient.  I have independently visualized and interpreted pt's images today. Nothing obvious on patient's head CT was visualized including no evidence of intracranial bleed and a CTA and perfusion study were ordered.  6:14 PM I independently interpreted patient's labs and EKG.  EKG without acute findings today.  CBC, CBC, EtOH are all within normal limits, valproic acid level is less than 10, COVID is negative.  CTA and perfusion studies are negative for evidence of acute stroke.  Speaking with neurology they think this may be Todd's paralysis.  Patient is now moving her left upper arm but still is unable to move her left lower leg.  They recommended EEG, MRI and admission.  Discussed with the patient and her husband.  Consulted hospitalist for admission.  Patient will also be given elevated doses of Depakote for seizure prophylaxis.  CRITICAL CARE Performed by: Jacari Kirsten Total critical care time: 30 minutes Critical care time was exclusive of separately billable procedures and treating other patients. Critical care was necessary to treat or prevent imminent or life-threatening deterioration. Critical care was time spent personally by me on the following activities: development of treatment plan with patient and/or surrogate as well as nursing, discussions with consultants, evaluation of patient's response to treatment, examination of patient, obtaining history from patient or surrogate, ordering and performing treatments and interventions, ordering and review of laboratory studies, ordering and review of radiographic studies, pulse oximetry and re-evaluation of patient's condition.        Final Clinical Impression(s) / ED Diagnoses Final diagnoses:  Seizure (HCC)  Todd's paralysis [facility])    Rx / DC Orders ED Discharge Orders     None         Gwyneth Sprout, MD 04/01/23 1814

## 2023-04-01 NOTE — Progress Notes (Signed)
Pt is unavail. At CT, try back as schedule permits

## 2023-04-01 NOTE — Progress Notes (Signed)
EEG complete - results pending 

## 2023-04-01 NOTE — ED Notes (Incomplete)
0

## 2023-04-01 NOTE — ED Notes (Signed)
Code stroke paged last known well time was estimated and said to be 1 hour ago per Dr. Anitra Lauth.Left side weakness was also given. No other info give to NS or care-link

## 2023-04-01 NOTE — Procedures (Signed)
Patient Name: Jaime Barry  MRN: [id number]  Epilepsy Attending: Charlsie Quest  Referring Physician/Provider: Mathews Argyle, NP  Date: 04/01/2023 Duration: 22.40 mins  Patient history: Patient BIB [facility] from home for focal seizures.EEG to evaluate for seizure  Level of alertness: Awake  AEDs during EEG study: VPA  Technical aspects: This EEG study was done with scalp electrodes positioned according to the 10-20 International system of electrode placement. Electrical activity was reviewed with band pass filter of 1-70Hz , sensitivity of 7 uV/mm, display speed of 19mm/sec with a 60Hz  notched filter applied as appropriate. EEG data were recorded continuously and digitally stored.  Video monitoring was available and reviewed as appropriate.  Description: The posterior dominant rhythm consists of 8.5 Hz activity of moderate voltage (25-35 uV) seen predominantly in posterior head regions, symmetric and reactive to eye opening and eye closing. EEG showed continuous 3 to 6 Hz theta-delta slowing with overriding 13-15hz  beta activity in right fronto-temporal region consistent with breach artifact.   Hyperventilation and photic stimulation were not performed.     ABNORMALITY - Breach artifact, right fronto-temporal region  IMPRESSION: This study is suggestive of cortical dysfunction arising from right fronto-temporal region consistent with underlying craniotomy. No seizures or epileptiform discharges were seen throughout the recording.  Jaime Barry

## 2023-04-01 NOTE — Consult Note (Addendum)
Neurology Consultation  Reason for Consult: Code stroke  Referring Physician: Dr. Anitra Lauth   CC: seizures  History is obtained from:husband, medical record   HPI: Jaime Barry is a 82 y.o. female with history of dementia, seizure disorder on Depakote, history of cerebral aneurysm with pain 2011 presents from home via EMS for seizure activity noted. LKW 0700 when he last spoke to her. NIHSS 24.  Her husband states they both have had an upper respiratory infection and has been sleeping most of the day, he heard her scream and found her on the floor with right side twitching, jerking and called EMS.  She was given Versed with EMS.  On evaluation in the emergency room, she was noted to have left side neglect, left hemiplegia, right gaze preference and Code stroke was activated in the ED by EDP. Code stroke. CT head with no acute process. CTA head and neck with no LVO. CT perfusion negative. There was a delay in obtaining CTA with Perf due to injector problems and needing to change Ct scan rooms.  Stat EEG. And check VPA level  LKW: 0700 IV thrombolysis given?: no, not a stroke EVT:  No LVO Premorbid modified Rankin scale (mRS):  3-Moderate disability-requires help but walks WITHOUT assistance  ROS:  Unable to obtain due to altered mental status.   Past Medical History:  Diagnosis Date   Altered mental state 08/29/2012   Dementia (HCC)    Dementia (HCC)    progressive    H/O cerebral aneurysm repair    in December of 2011   Lower leg edema    Osteoporosis    Seizure (HCC)    Seizures (HCC) 08/29/2012   Spinal stenosis      Family History  Problem Relation Age of Onset   Alzheimer's disease Mother    Leukemia Father        Died in his early 70s     Social History:   reports that she has quit smoking. She has never used smokeless tobacco. She reports that she does not drink alcohol and does not use drugs.  Medications No current facility-administered medications for this  encounter.  Current Outpatient Medications:    Calcium 600-200 MG-UNIT tablet, Take 1 tablet by mouth daily., Disp: , Rfl:    denosumab (PROLIA) 60 MG/ML SOLN injection, Inject 60 mg into the skin every 6 (six) months. Administer in upper arm, thigh, or abdomen, Disp: , Rfl:    divalproex (DEPAKOTE ER) 500 MG 24 hr tablet, Take 500 mg by mouth 2 (two) times daily. , Disp: , Rfl:    memantine (NAMENDA XR) 21 MG CP24 24 hr capsule, TAKE 1 CAPSULE(21 MG) BY MOUTH DAILY, Disp: 90 capsule, Rfl: 3   Exam: Current vital signs: BP (!) 101/57   Pulse 89   Resp 18   SpO2 93%  Vital signs in last 24 hours: Pulse Rate:  [89] 89 (06/30 1540) Resp:  [18] 18 (06/30 1540) BP: (101)/(57) 101/57 (06/30 1540) SpO2:  [93 %-94 %] 93 % (06/30 1540)  GENERAL: in no acute distress HEENT: - Normocephalic and atraumatic, dry mm, in c collar LUNGS - Clear to auscultation bilaterally with no wheezes CV - S1S2 RRR, no m/r/g, equal pulses bilaterally. ABDOMEN - Soft, nontender, nondistended with normoactive BS Ext: warm, well perfused, intact peripheral pulses, mild lower extremity edema  NEURO:  Mental Status: awake and alert not answering questions Language: speech is limited speech unable to name or repeat  Cranial Nerves: PERRL  EOM with right gaze preference, visual fields with no blink to threat on left, left facial asymmetry, facial sensation intact, hearing intact, tongue/uvula/soft palate midline, normal sternocleidomastoid and trapezius muscle strength. No evidence of tongue atrophy or fibrillations Motor: left side flaccid, right arm 5/5, right leg with drift  Tone: is normal and bulk is normal Sensation- decreased on left  Coordination: no ataxia Gait- deferred  NIHSS 1a Level of Conscious.: 0 1b LOC Questions: 2 1c LOC Commands: 0 2 Best Gaze: 2 3 Visual: 1 4 Facial Palsy: 1 5a Motor Arm - left: 4 5b Motor Arm - Right: 0 6a Motor Leg - Left: 4 6b Motor Leg - Right: 1 7 Limb Ataxia:  0 8 Sensory: 2 9 Best Language: 3 10 Dysarthria: 2 11 Extinct. and Inatten.: 2 TOTAL: 24   Labs I have reviewed labs in epic and the results pertinent to this consultation are:  CBC    Component Value Date/Time   WBC 5.0 12/27/2022 0912   WBC 4.7 09/01/2012 0612   RBC 4.07 12/27/2022 0912   RBC 3.58 (L) 09/01/2012 0612   HGB 12.6 04/01/2023 1549   HGB 13.0 12/27/2022 0912   HCT 37.0 04/01/2023 1549   HCT 39.5 12/27/2022 0912   PLT 226 12/27/2022 0912   MCV 97 12/27/2022 0912   MCH 31.9 12/27/2022 0912   MCH 32.7 09/01/2012 0612   MCHC 32.9 12/27/2022 0912   MCHC 32.9 09/01/2012 0612   RDW 12.3 12/27/2022 0912   LYMPHSABS 1.0 12/27/2022 0912   MONOABS 0.3 08/29/2012 0534   EOSABS 0.1 12/27/2022 0912   BASOSABS 0.0 12/27/2022 0912    CMP     Component Value Date/Time   NA 141 04/01/2023 1549   NA 142 12/27/2022 0912   K 3.5 04/01/2023 1549   CL 109 04/01/2023 1549   CO2 24 12/27/2022 0912   GLUCOSE 127 (H) 04/01/2023 1549   BUN 13 04/01/2023 1549   BUN 12 12/27/2022 0912   CREATININE 0.70 04/01/2023 1549   CALCIUM 9.4 12/27/2022 0912   PROT 6.7 12/27/2022 0912   ALBUMIN 4.1 12/27/2022 0912   AST 15 12/27/2022 0912   ALT 11 12/27/2022 0912   ALKPHOS 39 (L) 12/27/2022 0912   BILITOT 0.4 12/27/2022 0912   GFRNONAA 89 (L) 09/01/2012 0612   GFRAA >90 09/01/2012 0612    Lipid Panel  No results found for: "CHOL", "TRIG", "HDL", "CHOLHDL", "VLDL", "LDLCALC", "LDLDIRECT"  Lab Results  Component Value Date   HGBA1C 5.9 (H) 08/29/2012      Imaging I have reviewed the images obtained:  CT-head with no acute process CTA head and neck with no LVO- official read pending  CT perf negative - official read pending   Assessment:  82 y.o. female with history of dementia, seizure disorder on Depakote, history of cerebral aneurysm with pain 2011 presents from home via EMS for seizure activity noted. Her husband states they both have had an upper respiratory  infection and has been sleeping most of the day, he heard her scream and found her on the floor with right side twitching, jerking and called EMS.  She was given Versed with EMS.   Recommendations: - MRI brain w/o to evaluate for acute stroke. If positive will continue with full stroke workup - Stat EEG - Check VPA level - Check UA and UDS   Gevena Mart DNP, ACNPC-AG  [facility]  I have seen the patient reviewed the above note.  She has a history of  seizures and has been feeling off, and slept for most of the day, on his arrival, she had jerking activity and EMS gave Versed.  She does appear to be having an improving exam, but I would favor MRI as well.  My suspicion is this does represent seizure activity, I am not sure if she has been compliant with her Depakote given her low level.  I will give a 1500 mg load and start her on a higher dose of 500 mg 3 times daily in case she has been compliant.  Neurology will follow.  Ritta Slot, MD [facility] [phone]  If 7pm- 7am, please page neurology on call as listed in [facility].

## 2023-04-01 NOTE — ED Triage Notes (Signed)
Patient BIB [facility] from home for focal seizures. Patient husband called EMS after hearing the patient screaming and when he checked on her she was on the ground. EMS reports focal like activity with them, hx of same. Delay in patient transport d/t status of the house. Patient unable to answer questions, usually able to communicate normally. Patient has hx of brain aneurysm as well. VSS, CBG 143, 94% RA

## 2023-04-01 NOTE — ED Notes (Signed)
EEG at bedside.

## 2023-04-02 DIAGNOSIS — Z87891 Personal history of nicotine dependence: Secondary | ICD-10-CM | POA: Diagnosis not present

## 2023-04-02 DIAGNOSIS — E876 Hypokalemia: Secondary | ICD-10-CM | POA: Diagnosis present

## 2023-04-02 DIAGNOSIS — G8194 Hemiplegia, unspecified affecting left nondominant side: Secondary | ICD-10-CM | POA: Diagnosis present

## 2023-04-02 DIAGNOSIS — Z79899 Other long term (current) drug therapy: Secondary | ICD-10-CM | POA: Diagnosis not present

## 2023-04-02 DIAGNOSIS — G40909 Epilepsy, unspecified, not intractable, without status epilepticus: Secondary | ICD-10-CM | POA: Diagnosis present

## 2023-04-02 DIAGNOSIS — H4902 Third [oculomotor] nerve palsy, left eye: Secondary | ICD-10-CM | POA: Diagnosis present

## 2023-04-02 DIAGNOSIS — I959 Hypotension, unspecified: Secondary | ICD-10-CM | POA: Diagnosis present

## 2023-04-02 DIAGNOSIS — R414 Neurologic neglect syndrome: Secondary | ICD-10-CM | POA: Diagnosis present

## 2023-04-02 DIAGNOSIS — Z820 Family history of epilepsy and other diseases of the nervous system: Secondary | ICD-10-CM | POA: Diagnosis not present

## 2023-04-02 DIAGNOSIS — G309 Alzheimer's disease, unspecified: Secondary | ICD-10-CM | POA: Diagnosis present

## 2023-04-02 DIAGNOSIS — Z806 Family history of leukemia: Secondary | ICD-10-CM | POA: Diagnosis not present

## 2023-04-02 DIAGNOSIS — M810 Age-related osteoporosis without current pathological fracture: Secondary | ICD-10-CM | POA: Diagnosis present

## 2023-04-02 DIAGNOSIS — Z1152 Encounter for screening for COVID-19: Secondary | ICD-10-CM | POA: Diagnosis not present

## 2023-04-02 DIAGNOSIS — R569 Unspecified convulsions: Secondary | ICD-10-CM | POA: Diagnosis present

## 2023-04-02 DIAGNOSIS — F0280 Dementia in other diseases classified elsewhere without behavioral disturbance: Secondary | ICD-10-CM | POA: Diagnosis present

## 2023-04-02 DIAGNOSIS — E860 Dehydration: Secondary | ICD-10-CM | POA: Diagnosis present

## 2023-04-02 MED ORDER — QUETIAPINE FUMARATE 25 MG PO TABS
25.0000 mg | ORAL_TABLET | Freq: Every day | ORAL | Status: DC
  Administered 2023-04-02 – 2023-04-03 (×2): 25 mg via ORAL
  Filled 2023-04-02 (×2): qty 1

## 2023-04-02 MED ORDER — LACTATED RINGERS IV SOLN: INTRAVENOUS | Status: AC

## 2023-04-02 MED ORDER — HALOPERIDOL LACTATE 5 MG/ML IJ SOLN
2.0000 mg | Freq: Four times a day (QID) | INTRAMUSCULAR | Status: DC | PRN
  Administered 2023-04-02 – 2023-04-03 (×2): 2 mg via INTRAMUSCULAR
  Filled 2023-04-02 (×2): qty 1

## 2023-04-02 NOTE — Evaluation (Signed)
Physical Therapy Evaluation Patient Details Name: Jaime Barry MRN: [id number] DOB: 01-Apr-1941 Today's Date: 04/02/2023  History of Present Illness  82 y/o F admitted to [facility] on 6/30 for seizure activity at home. Once in ED, pt found to have significant left sided weakness and visual neglect, code stroke called, but CT and MRI of head negative for acute findings. PMHx: advanced dementia, seizures disorder on Depakote, cerebral aneurysm.  Clinical Impression  Pt presents today with impaired functional mobility, but limited by agitation and cognition during today's session. Pt has baseline dementia, no family present throughout session, so unsure of her PLOF, but pt reports being ambualtory at baseline. Noted decreased LUE strength but unable to fully assess due to pt being resistive to the exam. Pt required minA for bed mobility to initiate, with maximal encouragement to attempt standing. Pt declining any assistance, but was unable to complete a stand on her own. Acute PT will continue to follow up with pt to progress mobility as able. Due to pt's dementia, she would likely benefit from returning to a familiar environment if family feels comfortable providing full time care, and pending further mobility with therapy. If family is unable to provided needed assistance recommend subacute PT upon discharge, will continue to follow as appropriate.       Assistance Recommended at Discharge Frequent or constant Supervision/Assistance  If plan is discharge home, recommend the following:  Can travel by private vehicle  A little help with walking and/or transfers;Assist for transportation;Help with stairs or ramp for entrance        Equipment Recommendations Other (comment) (will continue to assess)  Recommendations for Other Services  OT consult    Functional Status Assessment Patient has had a recent decline in their functional status and demonstrates the ability to make significant improvements in  function in a reasonable and predictable amount of time.     Precautions / Restrictions Precautions Precautions: Fall;Other (comment) Precaution Comments: seizure Restrictions Weight Bearing Restrictions: No      Mobility  Bed Mobility Overal bed mobility: Needs Assistance Bed Mobility: Supine to Sit, Sit to Supine     Supine to sit: Min assist, HOB elevated Sit to supine: Min assist, HOB elevated   General bed mobility comments: minA for supine>sit to assist BLE in initiation. Assist back into bed for trunk support and ensuring proper positioning    Transfers Overall transfer level: Needs assistance Equipment used: None               General transfer comment: attempted to stand but pt unable to fully complete as she adamantly declined any physical assist but was unable to perform on her own    Ambulation/Gait               General Gait Details: pt declined attempts  Stairs            Wheelchair Mobility     Tilt Bed    Modified Rankin (Stroke Patients Only)       Balance Overall balance assessment: Needs assistance Sitting-balance support: Bilateral upper extremity supported, Feet supported Sitting balance-Leahy Scale: Fair         Standing balance comment: unable to complete without assist and pt declining assist                             Pertinent Vitals/Pain Pain Assessment Pain Assessment: Faces Faces Pain Scale: Hurts a little bit Pain Location: generalized  Pain Descriptors / Indicators: Aching Pain Intervention(s): Limited activity within patient's tolerance, Monitored during session, Repositioned    Home Living Family/patient expects to be discharged to:: Private residence Living Arrangements: Spouse/significant other Available Help at Discharge: Family;Available PRN/intermittently Type of Home: House Home Access: Stairs to enter Entrance Stairs-Rails: None Entrance Stairs-Number of Steps: "a few"   Home  Layout: One level Home Equipment: None Additional Comments: Pt providing history as well as notes from previous admission, no family present so unsure of accuracy    Prior Function Prior Level of Function : Independent/Modified Independent;History of Falls (last six months);Patient poor historian/Family not available             Mobility Comments: pt reports indepdence with all mobility, no AD, pt reports at least one fall ADLs Comments: reports independence but spouse drives     Hand Dominance        Extremity/Trunk Assessment   Upper Extremity Assessment Upper Extremity Assessment: Generalized weakness;Difficult to assess due to impaired cognition (difficult to fully assess due to cognition and agitation, LUE with weaker grasp but pt not participating in full assessment)    Lower Extremity Assessment Lower Extremity Assessment: Generalized weakness;Difficult to assess due to impaired cognition    Cervical / Trunk Assessment Cervical / Trunk Assessment: Kyphotic  Communication   Communication: No difficulties  Cognition Arousal/Alertness: Awake/alert Behavior During Therapy: Agitated Overall Cognitive Status: History of cognitive impairments - at baseline                                 General Comments: Pt oriented to self and place but not time. No family present. Pt answering questions but unsure of accuracy as pt has a history of dementia. Pt agitated throughout session, easily distracted by her gown and wires, attempting redirection as able        General Comments General comments (skin integrity, edema, etc.): pt on room air, VSS, no family present, pt resistant to most mobility    Exercises     Assessment/Plan    PT Assessment Patient needs continued PT services  PT Problem List Decreased strength;Decreased activity tolerance;Decreased balance;Decreased mobility;Decreased cognition;Decreased knowledge of use of DME;Decreased safety  awareness;Decreased knowledge of precautions       PT Treatment Interventions DME instruction;Gait training;Stair training;Functional mobility training;Therapeutic activities;Therapeutic exercise;Balance training;Neuromuscular re-education;Cognitive remediation;Patient/family education    PT Goals (Current goals can be found in the Care Plan section)  Acute Rehab PT Goals Patient Stated Goal: none stated today PT Goal Formulation: With patient Time For Goal Achievement: 04/16/23 Potential to Achieve Goals: Fair    Frequency Min 3X/week     Co-evaluation               AM-PAC PT "6 Clicks" Mobility  Outcome Measure Help needed turning from your back to your side while in a flat bed without using bedrails?: A Little Help needed moving from lying on your back to sitting on the side of a flat bed without using bedrails?: A Little Help needed moving to and from a bed to a chair (including a wheelchair)?: A Little Help needed standing up from a chair using your arms (e.g., wheelchair or bedside chair)?: A Little Help needed to walk in hospital room?: Total Help needed climbing 3-5 steps with a railing? : Total 6 Click Score: 14    End of Session   Activity Tolerance: Treatment limited secondary to agitation Patient left: in  bed;with bed alarm set;with call bell/phone within reach Nurse Communication: Mobility status PT Visit Diagnosis: Muscle weakness (generalized) (M62.81);Difficulty in walking, not elsewhere classified (R26.2);Other abnormalities of gait and mobility (R26.89)    Time: 1610-9604 PT Time Calculation (min) (ACUTE ONLY): 20 min   Charges:   PT Evaluation $PT Eval Moderate Complexity: 1 Mod   PT General Charges $$ ACUTE PT VISIT: 1 Visit         Lindalou Hose, PT DPT Acute Rehabilitation Services Office [phone]   Leonie Man 04/02/2023, 1:02 PM

## 2023-04-02 NOTE — Progress Notes (Signed)
PROGRESS NOTE                                                                                                                                                                                                             Patient Demographics:    Jaime Barry, is a 82 y.o. female, DOB - [birth year], RUE:[id number]  Outpatient Primary MD for the patient is Jaime Dimitri, MD    LOS - 0  Admit date - 04/01/2023    Chief Complaint  Patient presents with   Code Stroke       Brief Narrative (HPI from H&P)     82 y.o. female with medical history significant of advanced dementia, seizure disorder on Depakote, cerebral aneurysm presented with seizure activity at home.    Subjective:    Jaime Barry today has, No headache, No chest pain, No abdominal pain - No Nausea, No new weakness tingling or numbness, no SOB   Assessment  & Plan :    Breakthrough Seizure - Fall, L Sided Peresis - in a patient with history of seizures, subtherapeutic Depakote level, question compliance at home, thankfully MRI brain, CTA head and neck nonacute, seen by neurology, placed on IV Depakote high-dose, continue to monitor if no further seizure activity and likely discharge in the next 1 to 2 days.  PT OT and speech eval. L. Sided paresis improving, likely Todd's paralysis.  Hypotension.  Due to dehydration.  IV fluids.    H/O dementia.  Resume memantine, delirium precautions.        Condition - Extremely Guarded  Family Communication  :  husband Jaime Barry [phone]  on 04/02/23  Code Status :  Full  Consults  :  Neuro  PUD Prophylaxis :    Procedures  :     EEG - This study is suggestive of cortical dysfunction arising from right fronto-temporal region consistent with underlying craniotomy. No seizures or epileptiform discharges were seen throughout the recording.  CT C-spine.  Nonacute.    CTA head and neck.  No large vessel occlusion. 2. No  evidence of acute infarct or ischemic penumbra on CTP. 3. Prior clipping of a right supraclinoid ICA aneurysm. 4. Cervical carotid atherosclerosis with severe stenosis of the right ECA origin but no significant common or internal carotid artery stenosis. 5.  Aortic Atherosclerosis (ICD10-I70.0).  MRI - No acute intracranial process. No evidence of acute or subacute infarct.      Disposition Plan  :    Status is: Observation  DVT Prophylaxis  :    enoxaparin (LOVENOX) injection 40 mg Start: 04/01/23 2000  Lab Results  Component Value Date   PLT 136 (L) 04/01/2023    Diet :  Diet Order             Diet regular Room service appropriate? Yes; Fluid consistency: Thin  Diet effective now                    Inpatient Medications  Scheduled Meds:  enoxaparin (LOVENOX) injection  40 mg Subcutaneous Q24H   memantine  21 mg Oral Daily   mouth rinse  15 mL Mouth Rinse Q2H   Continuous Infusions:  sodium chloride 75 mL/hr (04/02/23 0422)   valproate sodium 500 mg (04/02/23 0426)   PRN Meds:.LORazepam, mouth rinse  Antibiotics  :    Anti-infectives (From admission, onward)    None         Objective:   Vitals:   04/02/23 0000 04/02/23 0022 04/02/23 0447 04/02/23 0825  BP: (!) 98/55 (!) 98/55 (!) 96/56 (!) 98/55  Pulse: 78 71  77  Resp: 13 18 16    Temp: 98.5 F (36.9 C) 98 F (36.7 C) 97.8 F (36.6 C)   TempSrc: Oral Oral Oral   SpO2: 96% 96%    Weight:      Height:        Wt Readings from Last 3 Encounters:  04/01/23 55.3 kg  12/27/22 55.3 kg  12/21/21 58.5 kg     Intake/Output Summary (Last 24 hours) at 04/02/2023 0940 Last data filed at 04/02/2023 0600 Gross per 24 hour  Intake 625 ml  Output --  Net 625 ml     Physical Exam  Awake Alert, No new F.N deficits, Normal affect [place].AT,PERRAL Supple Neck, No JVD,   Symmetrical Chest wall movement, Good air movement bilaterally, CTAB RRR,No Gallops,Rubs or new Murmurs,  +ve B.Sounds, Abd Soft,  No tenderness,   No Cyanosis, Clubbing or edema     Data Review:    Recent Labs  Lab 04/01/23 1546 04/01/23 1549  WBC 4.6  --   HGB 11.7* 12.6  HCT 38.0 37.0  PLT 136*  --   MCV 103.5*  --   MCH 31.9  --   MCHC 30.8  --   RDW 15.2  --   LYMPHSABS 0.9  --   MONOABS 0.2  --   EOSABS 0.0  --   BASOSABS 0.0  --     Recent Labs  Lab 04/01/23 1546 04/01/23 1549  NA 138 141  K 3.9 3.5  CL 109 109  CO2 20*  --   ANIONGAP 9  --   GLUCOSE 129* 127*  BUN 13 13  CREATININE 0.84 0.70  AST 24  --   ALT 8  --   ALKPHOS 27*  --   BILITOT 0.8  --   ALBUMIN 3.2*  --   INR 1.0  --   CALCIUM 8.2*  --       Recent Labs  Lab 04/01/23 1546  INR 1.0  CALCIUM 8.2*    Recent Labs  Lab 04/01/23 1546 04/01/23 1549  WBC 4.6  --   PLT 136*  --   CREATININE 0.84 0.70     Lab Results  Component Value Date   HGBA1C  5.9 (H) 08/29/2012     Radiology Reports MR BRAIN WO CONTRAST  Result Date: 04/01/2023 CLINICAL DATA:  Stroke suspected EXAM: MRI HEAD WITHOUT CONTRAST TECHNIQUE: Multiplanar, multiecho pulse sequences of the brain and surrounding structures were obtained without intravenous contrast. COMPARISON:  08/30/2012 MRI head, correlation is also made with CT head 04/01/2023 FINDINGS: Evaluation is mildly limited by susceptibility artifact from the patient's right ICA aneurysm clip. Brain: No restricted diffusion to suggest acute or subacute infarct. No acute hemorrhage, mass, mass effect, or midline shift. No hydrocephalus or extra-axial collection. Normal pituitary and craniocervical junction. No hemosiderin deposition to suggest remote hemorrhage. Age related cerebral atrophy. Confluent T2 hyperintense signal in the periventricular white matter, likely the sequela of moderate chronic small vessel ischemic disease. Vascular: Normal arterial flow voids. Susceptibility from right ICA aneurysm clips. Skull and upper cervical spine: Normal marrow signal. Sequela of prior right  frontotemporal craniotomy. Sinuses/Orbits: Mucosal thickening ethmoid air cells. No acute finding in the orbits. Other: The mastoid air cells are well aerated. IMPRESSION: No acute intracranial process. No evidence of acute or subacute infarct. Electronically Signed   By: Wiliam Ke M.D.   On: 04/01/2023 19:22   CT C-SPINE NO CHARGE  Result Date: 04/01/2023 CLINICAL DATA:  Seizures.  Found down. EXAM: CT CERVICAL SPINE WITH CONTRAST TECHNIQUE: Multiplanar CT images of the cervical spine were reconstructed from contemporary CTA of the Neck. RADIATION DOSE REDUCTION: This exam was performed according to the departmental dose-optimization program which includes automated exposure control, adjustment of the mA and/or kV according to patient size and/or use of iterative reconstruction technique. CONTRAST:  No additional. COMPARISON:  None Available. FINDINGS: Alignment: Straightening of the normal cervical lordosis. Trace anterolisthesis of C3 on C4 and trace retrolisthesis of C5 on C6, likely degenerative. Skull base and vertebrae: No acute cervical spine fracture or suspicious osseous lesion. Mild compression deformity of the T3 superior endplate, favored to be chronic. Soft tissues and spinal canal: No prevertebral fluid or swelling. No visible canal hematoma. Disc levels: Moderate to severe disc degeneration from C3-4 through C6-7. Multilevel facet arthrosis, most severe on the left at C3-4 and C4-5. Mild-to-moderate spinal stenosis at C5-6 and C6-7. Moderate neural foraminal stenosis bilaterally at C5-6 and C6-7 and on the left at C3-4. Upper chest: No mass or consolidation in the lung apices. Other: None. IMPRESSION: 1. No acute cervical spine fracture. 2. Advanced cervical disc and facet degeneration. Electronically Signed   By: Sebastian Ache M.D.   On: 04/01/2023 17:15   CT ANGIO HEAD NECK W WO CM W PERF (CODE STROKE)  Result Date: 04/01/2023 CLINICAL DATA:  Neuro deficit, acute, stroke suspected.  Left-sided weakness. EXAM: CT ANGIOGRAPHY HEAD AND NECK CT PERFUSION BRAIN TECHNIQUE: Multidetector CT imaging of the head and neck was performed using the standard protocol during bolus administration of intravenous contrast. Multiplanar CT image reconstructions and MIPs were obtained to evaluate the vascular anatomy. Carotid stenosis measurements (when applicable) are obtained utilizing NASCET criteria, using the distal internal carotid diameter as the denominator. Multiphase CT imaging of the brain was performed following IV bolus contrast injection. Subsequent parametric perfusion maps were calculated using RAPID software. RADIATION DOSE REDUCTION: This exam was performed according to the departmental dose-optimization program which includes automated exposure control, adjustment of the mA and/or kV according to patient size and/or use of iterative reconstruction technique. CONTRAST:  OMNIPAQUE IOHEXOL 350 MG/ML SOLN COMPARISON:  CTA head 08/17/2010 FINDINGS: CTA NECK FINDINGS Aortic arch: Standard 3 vessel aortic arch  with mild atherosclerotic plaque. No stenosis of the arch vessel origins. Right carotid system: Patent with predominantly calcified plaque at the carotid bifurcation resulting in severe stenosis of the ECA origin but no significant ICA or common carotid artery stenosis. Tortuous distal cervical ICA. Left carotid system: Patent with a small amount of calcified plaque at the carotid bifurcation. No significant stenosis. Vertebral arteries: Patent without evidence of stenosis or dissection. Mildly dominant right vertebral artery. Skeleton: Advanced cervical disc and facet degeneration. Mild compression deformity of the T3 superior endplate, chronic in appearance. Other neck: No evidence of cervical lymphadenopathy or mass. Upper chest: No mass or consolidation in the included lung apices. Review of the MIP images confirms the above findings CTA HEAD FINDINGS Anterior circulation: The internal  carotid arteries are widely patent from skull base to carotid termini with prominent tortuosity of the cavernous segments. There has been interval clipping of a right supraclinoid ICA aneurysm with some peripheral calcification of the treated aneurysm but no definite aneurysm opacification. ACAs and MCAs are patent without evidence of a proximal branch occlusion or significant proximal stenosis. Posterior circulation: The intracranial vertebral arteries are widely patent to the basilar. The basilar artery is widely patent. There is a large right posterior communicating artery with hypoplasia of the right P1 segment. Both PCAs are patent without evidence of a significant proximal stenosis. No aneurysm is identified. Venous sinuses: As permitted by contrast timing, patent. Anatomic variants: Fetal right PCA.  Hypoplastic right A1 segment. Review of the MIP images confirms the above findings CT Brain Perfusion Findings: ASPECTS: 10 CBF (<30%) Volume: 0 mL Perfusion (Tmax>6.0s) volume: 0 mL Mismatch Volume: 0 mL Infarction Location: None identified by CTP. These results were communicated to Dr. Amada Jupiter at 4:37 pm on 04/01/2023 by text page via the [facility] messaging system. IMPRESSION: 1. No large vessel occlusion. 2. No evidence of acute infarct or ischemic penumbra on CTP. 3. Prior clipping of a right supraclinoid ICA aneurysm. 4. Cervical carotid atherosclerosis with severe stenosis of the right ECA origin but no significant common or internal carotid artery stenosis. 5.  Aortic Atherosclerosis (ICD10-I70.0). Electronically Signed   By: Sebastian Ache M.D.   On: 04/01/2023 16:48   EEG adult  Result Date: 04/01/2023 Charlsie Quest, MD     04/01/2023  5:36 PM Patient Name: LINDSAY FREVERT MRN: [id number] Epilepsy Attending: Charlsie Quest Referring Physician/Provider: Mathews Argyle, NP Date: 04/01/2023 Duration: 22.40 mins Patient history: Patient BIB [facility] from home for focal seizures.EEG to evaluate for seizure  Level of alertness: Awake AEDs during EEG study: VPA Technical aspects: This EEG study was done with scalp electrodes positioned according to the 10-20 International system of electrode placement. Electrical activity was reviewed with band pass filter of 1-70Hz , sensitivity of 7 uV/mm, display speed of 69mm/sec with a 60Hz  notched filter applied as appropriate. EEG data were recorded continuously and digitally stored.  Video monitoring was available and reviewed as appropriate. Description: The posterior dominant rhythm consists of 8.5 Hz activity of moderate voltage (25-35 uV) seen predominantly in posterior head regions, symmetric and reactive to eye opening and eye closing. EEG showed continuous 3 to 6 Hz theta-delta slowing with overriding 13-15hz  beta activity in right fronto-temporal region consistent with breach artifact.   Hyperventilation and photic stimulation were not performed.   ABNORMALITY - Breach artifact, right fronto-temporal region IMPRESSION: This study is suggestive of cortical dysfunction arising from right fronto-temporal region consistent with underlying craniotomy. No seizures or epileptiform discharges were seen  throughout the recording. Charlsie Quest   CT HEAD CODE STROKE WO CONTRAST  Result Date: 04/01/2023 CLINICAL DATA:  Code stroke. Neuro deficit, acute, stroke suspected. Left-sided weakness. EXAM: CT HEAD WITHOUT CONTRAST TECHNIQUE: Contiguous axial images were obtained from the base of the skull through the vertex without intravenous contrast. RADIATION DOSE REDUCTION: This exam was performed according to the departmental dose-optimization program which includes automated exposure control, adjustment of the mA and/or kV according to patient size and/or use of iterative reconstruction technique. COMPARISON:  Head CT 08/29/2012 and MRI 08/30/2012 FINDINGS: Brain: There is no evidence of an acute infarct, intracranial hemorrhage, mass, midline shift, or extra-axial fluid  collection. Patchy hypodensities in the cerebral white matter bilaterally have progressed and are nonspecific but compatible with moderate chronic small vessel ischemic disease. There is progressive, mild-to-moderate cerebral atrophy. Vascular: Prior right ICA aneurysm clipping. Skull: Right frontotemporal craniotomy. Sinuses/Orbits: Minor mucosal thickening in the paranasal sinuses. Clear mastoid air cells. Unremarkable orbits. Other: None. ASPECTS ([place]ber[place] Stroke Program Early CT Score) - Ganglionic level infarction (caudate, lentiform nuclei, internal capsule, insula, M1-M3 cortex): 7 - Supraganglionic infarction (M4-M6 cortex): 3 Total score (0-10 with 10 being normal): 10 These results were communicated to Dr. Amada Jupiter at 4:07 pm on 04/01/2023 by text page via the [facility] messaging system. IMPRESSION: 1. No evidence of acute intracranial abnormality. ASPECTS of 10. 2. Moderate chronic small vessel ischemic disease. Electronically Signed   By: Sebastian Ache M.D.   On: 04/01/2023 16:07      Signature  -   Susa Raring M.D on 04/02/2023 at 9:40 AM   -  To page go to [email or web address]

## 2023-04-02 NOTE — Progress Notes (Signed)
SLP Cancellation Note  Patient Details Name: Jaime Barry MRN: [id number] DOB: Sep 16, 1941   Cancelled evaluation: Pt adamantly refused to take any POs for swallow evaluation. Per RN, she has been refusing all interventions.  D/W RN and Dr. Thedore Mins.  Will sign off. If any difficulty is noted during meals, please feel free to reconsult our service.   Kayleigh Broadwell L. Samson Frederic, MA CCC/SLP Clinical Specialist - Acute Care SLP Acute Rehabilitation Services Office number [phone]          [name] [name] 04/02/2023, 10:12 AM

## 2023-04-03 LAB — CBC WITH DIFFERENTIAL/PLATELET
Abs Immature Granulocytes: 0.02 10*3/uL (ref 0.00–0.07)
Basophils Absolute: 0 10*3/uL (ref 0.0–0.1)
Basophils Relative: 1 %
Eosinophils Absolute: 0.1 10*3/uL (ref 0.0–0.5)
Eosinophils Relative: 1 %
HCT: 32.5 % — ABNORMAL LOW (ref 36.0–46.0)
Hemoglobin: 10.5 g/dL — ABNORMAL LOW (ref 12.0–15.0)
Immature Granulocytes: 1 %
Lymphocytes Relative: 37 %
Lymphs Abs: 1.6 10*3/uL (ref 0.7–4.0)
MCH: 32.7 pg (ref 26.0–34.0)
MCHC: 32.3 g/dL (ref 30.0–36.0)
MCV: 101.2 fL — ABNORMAL HIGH (ref 80.0–100.0)
Monocytes Absolute: 0.4 10*3/uL (ref 0.1–1.0)
Monocytes Relative: 9 %
Neutro Abs: 2.2 10*3/uL (ref 1.7–7.7)
Neutrophils Relative %: 51 %
Platelets: 158 10*3/uL (ref 150–400)
RBC: 3.21 MIL/uL — ABNORMAL LOW (ref 3.87–5.11)
RDW: 15.2 % (ref 11.5–15.5)
WBC: 4.3 10*3/uL (ref 4.0–10.5)
nRBC: 0 % (ref 0.0–0.2)

## 2023-04-03 LAB — BASIC METABOLIC PANEL
Anion gap: 7 (ref 5–15)
BUN: <5 mg/dL — ABNORMAL LOW (ref 8–23)
CO2: 24 mmol/L (ref 22–32)
Calcium: 7.6 mg/dL — ABNORMAL LOW (ref 8.9–10.3)
Chloride: 111 mmol/L (ref 98–111)
Creatinine, Ser: 0.62 mg/dL (ref 0.44–1.00)
GFR, Estimated: >60 mL/min (ref >60)
Glucose, Bld: 89 mg/dL (ref 70–99)
Potassium: 2.9 mmol/L — ABNORMAL LOW (ref 3.5–5.1)
Sodium: 142 mmol/L (ref 135–145)

## 2023-04-03 LAB — URINALYSIS, ROUTINE W REFLEX MICROSCOPIC
Bacteria, UA: NONE SEEN
Bilirubin Urine: NEGATIVE
Glucose, UA: NEGATIVE mg/dL
Hgb urine dipstick: NEGATIVE
Ketones, ur: 5 mg/dL — AB
Nitrite: POSITIVE — AB
Protein, ur: NEGATIVE mg/dL
Specific Gravity, Urine: 1.01 (ref 1.005–1.030)
pH: 8 (ref 5.0–8.0)

## 2023-04-03 LAB — PHOSPHORUS: Phosphorus: 3.1 mg/dL (ref 2.5–4.6)

## 2023-04-03 LAB — MAGNESIUM: Magnesium: 1.9 mg/dL (ref 1.7–2.4)

## 2023-04-03 MED ORDER — POTASSIUM CHLORIDE 10 MEQ/100ML IV SOLN
10.0000 meq | INTRAVENOUS | Status: AC
  Administered 2023-04-03 (×4): 10 meq via INTRAVENOUS
  Filled 2023-04-03 (×4): qty 100

## 2023-04-03 MED ORDER — LACTATED RINGERS IV SOLN: INTRAVENOUS | Status: AC

## 2023-04-03 MED ORDER — MAGNESIUM SULFATE 2 GM/50ML IV SOLN
2.0000 g | Freq: Once | INTRAVENOUS | Status: AC
  Administered 2023-04-03: 2 g via INTRAVENOUS
  Filled 2023-04-03: qty 50

## 2023-04-03 MED ORDER — POTASSIUM CHLORIDE 2 MEQ/ML IV SOLN
INTRAVENOUS | Status: DC
  Filled 2023-04-03 (×2): qty 1000

## 2023-04-03 MED ORDER — ORAL CARE MOUTH RINSE: 15.0000 mL | OROMUCOSAL | Status: DC | PRN

## 2023-04-03 NOTE — Progress Notes (Signed)
PROGRESS NOTE                                                                                                                                                                                                             Patient Demographics:    Jaime Barry, is a 82 y.o. female, DOB - Sep 30, 1941, ZOX:[id number]  Outpatient Primary MD for the patient is Gweneth Dimitri, MD    LOS - 1  Admit date - 04/01/2023    Chief Complaint  Patient presents with   Code Stroke       Brief Narrative (HPI from H&P)     82 y.o. female with medical history significant of advanced dementia, seizure disorder on Depakote, cerebral aneurysm presented with seizure activity at home.    Subjective:   Patient in bed, denies any headache, no chest pain, is thoroughly confused.   Assessment  & Plan :    Breakthrough Seizure - Fall, L Sided Peresis - in a patient with history of seizures, subtherapeutic Depakote level, question compliance at home, thankfully MRI brain, CTA head and neck nonacute, seen by neurology, placed on IV Depakote high-dose, continue to monitor if no further seizure activity and likely discharge in the next 1 to 2 days.  PT OT and speech eval. L. Sided paresis improving, likely Todd's paralysis.  Hypotension.  Due to dehydration.  IV fluids.    Hypokalemia.  Replaced.    H/O dementia.  Does have severe metabolic encephalopathy, minimize narcotics and benzodiazepines, placed on Seroquel and Haldol.  Continue memantine.        Condition - Extremely Guarded  Family Communication  :  husband Gery Pray [phone]  on 04/02/23, 04/03/2023.  Code Status :  Full  Consults  :  Neuro  PUD Prophylaxis :    Procedures  :     EEG - This study is suggestive of cortical dysfunction arising from right fronto-temporal region consistent with underlying craniotomy. No seizures or epileptiform discharges were seen throughout the  recording.  CT C-spine.  Nonacute.    CTA head and neck.  No large vessel occlusion. 2. No evidence of acute infarct or ischemic penumbra on CTP. 3. Prior clipping of a right supraclinoid ICA aneurysm. 4. Cervical carotid atherosclerosis with severe stenosis of the right ECA origin but no significant common or internal carotid artery stenosis.  5.  Aortic Atherosclerosis (ICD10-I70.0).    MRI - No acute intracranial process. No evidence of acute or subacute infarct.      Disposition Plan  :    Status is: Observation  DVT Prophylaxis  :    enoxaparin (LOVENOX) injection 40 mg Start: 04/01/23 2000  Lab Results  Component Value Date   PLT 158 04/03/2023    Diet :  Diet Order             Diet regular Room service appropriate? Yes; Fluid consistency: Thin  Diet effective now                    Inpatient Medications  Scheduled Meds:  enoxaparin (LOVENOX) injection  40 mg Subcutaneous Q24H   memantine  21 mg Oral Daily   mouth rinse  15 mL Mouth Rinse Q2H   QUEtiapine  25 mg Oral QHS   Continuous Infusions:  lactated ringers 1,000 mL with potassium chloride 40 mEq infusion     lactated ringers 100 mL/hr at 04/03/23 0610   magnesium sulfate bolus IVPB     potassium chloride 10 mEq (04/03/23 0819)   valproate sodium 500 mg (04/03/23 0307)   PRN Meds:.haloperidol lactate, LORazepam, mouth rinse  Antibiotics  :    Anti-infectives (From admission, onward)    None         Objective:   Vitals:   04/02/23 1652 04/02/23 1935 04/02/23 1944 04/03/23 0523  BP:   90/67 (!) 105/50  Pulse:    68  Resp:  17  18  Temp: 98.6 F (37 C)  98.2 F (36.8 C) 97.9 F (36.6 C)  TempSrc: Axillary  Oral Oral  SpO2:    94%  Weight:      Height:        Wt Readings from Last 3 Encounters:  04/01/23 55.3 kg  12/27/22 55.3 kg  12/21/21 58.5 kg     Intake/Output Summary (Last 24 hours) at 04/03/2023 0928 Last data filed at 04/03/2023 0610 Gross per 24 hour  Intake 171.24  ml  Output 700 ml  Net -528.76 ml     Physical Exam  Awake but confused, No new F.N deficits, Normal affect [place].AT,PERRAL Supple Neck, No JVD,   Symmetrical Chest wall movement, Good air movement bilaterally, CTAB RRR,No Gallops,Rubs or new Murmurs,  +ve B.Sounds, Abd Soft, No tenderness,   No Cyanosis, Clubbing or edema     Data Review:    Recent Labs  Lab 04/01/23 1546 04/01/23 1549 04/03/23 0339  WBC 4.6  --  4.3  HGB 11.7* 12.6 10.5*  HCT 38.0 37.0 32.5*  PLT 136*  --  158  MCV 103.5*  --  101.2*  MCH 31.9  --  32.7  MCHC 30.8  --  32.3  RDW 15.2  --  15.2  LYMPHSABS 0.9  --  1.6  MONOABS 0.2  --  0.4  EOSABS 0.0  --  0.1  BASOSABS 0.0  --  0.0    Recent Labs  Lab 04/01/23 1546 04/01/23 1549 04/03/23 0339  NA 138 141 142  K 3.9 3.5 2.9*  CL 109 109 111  CO2 20*  --  24  ANIONGAP 9  --  7  GLUCOSE 129* 127* 89  BUN 13 13 <5*  CREATININE 0.84 0.70 0.62  AST 24  --   --   ALT 8  --   --   ALKPHOS 27*  --   --  BILITOT 0.8  --   --   ALBUMIN 3.2*  --   --   INR 1.0  --   --   MG  --   --  1.9  CALCIUM 8.2*  --  7.6*      Recent Labs  Lab 04/01/23 1546 04/03/23 0339  INR 1.0  --   MG  --  1.9  CALCIUM 8.2* 7.6*    Recent Labs  Lab 04/01/23 1546 04/01/23 1549 04/03/23 0339  WBC 4.6  --  4.3  PLT 136*  --  158  CREATININE 0.84 0.70 0.62     Lab Results  Component Value Date   HGBA1C 5.9 (H) 08/29/2012     Radiology Reports MR BRAIN WO CONTRAST  Result Date: 04/01/2023 CLINICAL DATA:  Stroke suspected EXAM: MRI HEAD WITHOUT CONTRAST TECHNIQUE: Multiplanar, multiecho pulse sequences of the brain and surrounding structures were obtained without intravenous contrast. COMPARISON:  08/30/2012 MRI head, correlation is also made with CT head 04/01/2023 FINDINGS: Evaluation is mildly limited by susceptibility artifact from the patient's right ICA aneurysm clip. Brain: No restricted diffusion to suggest acute or subacute infarct. No acute  hemorrhage, mass, mass effect, or midline shift. No hydrocephalus or extra-axial collection. Normal pituitary and craniocervical junction. No hemosiderin deposition to suggest remote hemorrhage. Age related cerebral atrophy. Confluent T2 hyperintense signal in the periventricular white matter, likely the sequela of moderate chronic small vessel ischemic disease. Vascular: Normal arterial flow voids. Susceptibility from right ICA aneurysm clips. Skull and upper cervical spine: Normal marrow signal. Sequela of prior right frontotemporal craniotomy. Sinuses/Orbits: Mucosal thickening ethmoid air cells. No acute finding in the orbits. Other: The mastoid air cells are well aerated. IMPRESSION: No acute intracranial process. No evidence of acute or subacute infarct. Electronically Signed   By: Wiliam Ke M.D.   On: 04/01/2023 19:22   CT C-SPINE NO CHARGE  Result Date: 04/01/2023 CLINICAL DATA:  Seizures.  Found down. EXAM: CT CERVICAL SPINE WITH CONTRAST TECHNIQUE: Multiplanar CT images of the cervical spine were reconstructed from contemporary CTA of the Neck. RADIATION DOSE REDUCTION: This exam was performed according to the departmental dose-optimization program which includes automated exposure control, adjustment of the mA and/or kV according to patient size and/or use of iterative reconstruction technique. CONTRAST:  No additional. COMPARISON:  None Available. FINDINGS: Alignment: Straightening of the normal cervical lordosis. Trace anterolisthesis of C3 on C4 and trace retrolisthesis of C5 on C6, likely degenerative. Skull base and vertebrae: No acute cervical spine fracture or suspicious osseous lesion. Mild compression deformity of the T3 superior endplate, favored to be chronic. Soft tissues and spinal canal: No prevertebral fluid or swelling. No visible canal hematoma. Disc levels: Moderate to severe disc degeneration from C3-4 through C6-7. Multilevel facet arthrosis, most severe on the left at C3-4 and  C4-5. Mild-to-moderate spinal stenosis at C5-6 and C6-7. Moderate neural foraminal stenosis bilaterally at C5-6 and C6-7 and on the left at C3-4. Upper chest: No mass or consolidation in the lung apices. Other: None. IMPRESSION: 1. No acute cervical spine fracture. 2. Advanced cervical disc and facet degeneration. Electronically Signed   By: Sebastian Ache M.D.   On: 04/01/2023 17:15   CT ANGIO HEAD NECK W WO CM W PERF (CODE STROKE)  Result Date: 04/01/2023 CLINICAL DATA:  Neuro deficit, acute, stroke suspected. Left-sided weakness. EXAM: CT ANGIOGRAPHY HEAD AND NECK CT PERFUSION BRAIN TECHNIQUE: Multidetector CT imaging of the head and neck was performed using the standard protocol during bolus  administration of intravenous contrast. Multiplanar CT image reconstructions and MIPs were obtained to evaluate the vascular anatomy. Carotid stenosis measurements (when applicable) are obtained utilizing NASCET criteria, using the distal internal carotid diameter as the denominator. Multiphase CT imaging of the brain was performed following IV bolus contrast injection. Subsequent parametric perfusion maps were calculated using RAPID software. RADIATION DOSE REDUCTION: This exam was performed according to the departmental dose-optimization program which includes automated exposure control, adjustment of the mA and/or kV according to patient size and/or use of iterative reconstruction technique. CONTRAST:  OMNIPAQUE IOHEXOL 350 MG/ML SOLN COMPARISON:  CTA head 08/17/2010 FINDINGS: CTA NECK FINDINGS Aortic arch: Standard 3 vessel aortic arch with mild atherosclerotic plaque. No stenosis of the arch vessel origins. Right carotid system: Patent with predominantly calcified plaque at the carotid bifurcation resulting in severe stenosis of the ECA origin but no significant ICA or common carotid artery stenosis. Tortuous distal cervical ICA. Left carotid system: Patent with a small amount of calcified plaque at the carotid  bifurcation. No significant stenosis. Vertebral arteries: Patent without evidence of stenosis or dissection. Mildly dominant right vertebral artery. Skeleton: Advanced cervical disc and facet degeneration. Mild compression deformity of the T3 superior endplate, chronic in appearance. Other neck: No evidence of cervical lymphadenopathy or mass. Upper chest: No mass or consolidation in the included lung apices. Review of the MIP images confirms the above findings CTA HEAD FINDINGS Anterior circulation: The internal carotid arteries are widely patent from skull base to carotid termini with prominent tortuosity of the cavernous segments. There has been interval clipping of a right supraclinoid ICA aneurysm with some peripheral calcification of the treated aneurysm but no definite aneurysm opacification. ACAs and MCAs are patent without evidence of a proximal branch occlusion or significant proximal stenosis. Posterior circulation: The intracranial vertebral arteries are widely patent to the basilar. The basilar artery is widely patent. There is a large right posterior communicating artery with hypoplasia of the right P1 segment. Both PCAs are patent without evidence of a significant proximal stenosis. No aneurysm is identified. Venous sinuses: As permitted by contrast timing, patent. Anatomic variants: Fetal right PCA.  Hypoplastic right A1 segment. Review of the MIP images confirms the above findings CT Brain Perfusion Findings: ASPECTS: 10 CBF (<30%) Volume: 0 mL Perfusion (Tmax>6.0s) volume: 0 mL Mismatch Volume: 0 mL Infarction Location: None identified by CTP. These results were communicated to Dr. Amada Jupiter at 4:37 pm on 04/01/2023 by text page via the [facility] messaging system. IMPRESSION: 1. No large vessel occlusion. 2. No evidence of acute infarct or ischemic penumbra on CTP. 3. Prior clipping of a right supraclinoid ICA aneurysm. 4. Cervical carotid atherosclerosis with severe stenosis of the right ECA origin  but no significant common or internal carotid artery stenosis. 5.  Aortic Atherosclerosis (ICD10-I70.0). Electronically Signed   By: Sebastian Ache M.D.   On: 04/01/2023 16:48   EEG adult  Result Date: 04/01/2023 Charlsie Quest, MD     04/01/2023  5:36 PM Patient Name: Jaime Barry MRN: [id number] Epilepsy Attending: Charlsie Quest Referring Physician/Provider: Mathews Argyle, NP Date: 04/01/2023 Duration: 22.40 mins Patient history: Patient BIB [facility] from home for focal seizures.EEG to evaluate for seizure Level of alertness: Awake AEDs during EEG study: VPA Technical aspects: This EEG study was done with scalp electrodes positioned according to the 10-20 International system of electrode placement. Electrical activity was reviewed with band pass filter of 1-70Hz , sensitivity of 7 uV/mm, display speed of 67mm/sec with a 60Hz   notched filter applied as appropriate. EEG data were recorded continuously and digitally stored.  Video monitoring was available and reviewed as appropriate. Description: The posterior dominant rhythm consists of 8.5 Hz activity of moderate voltage (25-35 uV) seen predominantly in posterior head regions, symmetric and reactive to eye opening and eye closing. EEG showed continuous 3 to 6 Hz theta-delta slowing with overriding 13-15hz  beta activity in right fronto-temporal region consistent with breach artifact.   Hyperventilation and photic stimulation were not performed.   ABNORMALITY - Breach artifact, right fronto-temporal region IMPRESSION: This study is suggestive of cortical dysfunction arising from right fronto-temporal region consistent with underlying craniotomy. No seizures or epileptiform discharges were seen throughout the recording. Charlsie Quest   CT HEAD CODE STROKE WO CONTRAST  Result Date: 04/01/2023 CLINICAL DATA:  Code stroke. Neuro deficit, acute, stroke suspected. Left-sided weakness. EXAM: CT HEAD WITHOUT CONTRAST TECHNIQUE: Contiguous axial images were  obtained from the base of the skull through the vertex without intravenous contrast. RADIATION DOSE REDUCTION: This exam was performed according to the departmental dose-optimization program which includes automated exposure control, adjustment of the mA and/or kV according to patient size and/or use of iterative reconstruction technique. COMPARISON:  Head CT 08/29/2012 and MRI 08/30/2012 FINDINGS: Brain: There is no evidence of an acute infarct, intracranial hemorrhage, mass, midline shift, or extra-axial fluid collection. Patchy hypodensities in the cerebral white matter bilaterally have progressed and are nonspecific but compatible with moderate chronic small vessel ischemic disease. There is progressive, mild-to-moderate cerebral atrophy. Vascular: Prior right ICA aneurysm clipping. Skull: Right frontotemporal craniotomy. Sinuses/Orbits: Minor mucosal thickening in the paranasal sinuses. Clear mastoid air cells. Unremarkable orbits. Other: None. ASPECTS (Alberta Stroke Program Early CT Score) - Ganglionic level infarction (caudate, lentiform nuclei, internal capsule, insula, M1-M3 cortex): 7 - Supraganglionic infarction (M4-M6 cortex): 3 Total score (0-10 with 10 being normal): 10 These results were communicated to Dr. Amada Jupiter at 4:07 pm on 04/01/2023 by text page via the [facility] messaging system. IMPRESSION: 1. No evidence of acute intracranial abnormality. ASPECTS of 10. 2. Moderate chronic small vessel ischemic disease. Electronically Signed   By: Sebastian Ache M.D.   On: 04/01/2023 16:07      Signature  -   Susa Raring M.D on 04/03/2023 at 9:28 AM   -  To page go to [email or web address]

## 2023-04-03 NOTE — Progress Notes (Signed)
Physical Therapy Treatment Patient Details Name: Jaime Barry MRN: [id number] DOB: 1941-02-27 Today's Date: 04/03/2023   History of Present Illness 82 y/o F admitted to [facility] on 6/30 for seizure activity at home. Once in ED, pt found to have significant left sided weakness and visual neglect, code stroke called, but CT and MRI of head negative for acute findings. PMHx: advanced dementia, seizures disorder on Depakote, cerebral aneurysm.    PT Comments  Pt greeted supine in bed pleasant and agreeable to session with steady progress toward acute goals, however pt continues to be limited by impaired cognition, decreased activity tolerance, weakness and L inattention. Pt able to transition to sitting EOB with min guard for safety and demonstrating good static sitting balance. Pt able to rise to standing with Les fully extended but trunk flexed to near 90 degrees with pt unable to elevate trunk despite max multimodal cues, pt able to maintain this posture with min guard and step pivot to chair with mod A to steady during transfer and guide hips to sitting surface. Pt continues to resistive to physical assist throughout session, however pt accepting HHA during transfer to chair with encouragement and education re; safety. Continue to agree with current recommendation pending appropriate family support.     Assistance Recommended at Discharge Frequent or constant Supervision/Assistance  If plan is discharge home, recommend the following:  Can travel by private vehicle    A little help with walking and/or transfers;Assist for transportation;Help with stairs or ramp for entrance      Equipment Recommendations  Other (comment) (will continue to assess)    Recommendations for Other Services       Precautions / Restrictions Precautions Precautions: Fall;Other (comment) Precaution Comments: seizure Restrictions Weight Bearing Restrictions: No     Mobility  Bed Mobility Overal bed mobility: Needs  Assistance Bed Mobility: Supine to Sit     Supine to sit: HOB elevated, Min guard     General bed mobility comments: min guard for safety with increased time    Transfers Overall transfer level: Needs assistance Equipment used: None, 1 person hand held assist Transfers: Sit to/from Stand, Bed to chair/wheelchair/BSC Sit to Stand: Min guard   Step pivot transfers: Mod assist       General transfer comment: pt able to clear hips from bed and extend LEs with trunk flexed at 90 degrees, pt unable to raise trunk to upright standing despite multimodal cues, pt agreeable to assist to step pivot to R to recliner chair needing mod A to guide hips and steady troughout transfer    Ambulation/Gait                   Stairs             Wheelchair Mobility     Tilt Bed    Modified Rankin (Stroke Patients Only)       Balance Overall balance assessment: Needs assistance Sitting-balance support: Bilateral upper extremity supported, Feet supported Sitting balance-Leahy Scale: Fair     Standing balance support: Single extremity supported Standing balance-Leahy Scale: Poor Standing balance comment: single UE support and external assist to maintain                            Cognition Arousal/Alertness: Awake/alert Behavior During Therapy: WFL for tasks assessed/performed Overall Cognitive Status: History of cognitive impairments - at baseline  General Comments: Pt oriented to self and place but not time. No family present. pt easily distracted by gown and lines, needing redirection to remain on task, decreased attention to environement on L        Exercises      General Comments General comments (skin integrity, edema, etc.): VSS on RA,      Pertinent Vitals/Pain Pain Assessment Pain Assessment: No/denies pain Pain Intervention(s): Monitored during session    Home Living                           Prior Function            PT Goals (current goals can now be found in the care plan section) Acute Rehab PT Goals Patient Stated Goal: none stated today PT Goal Formulation: With patient Time For Goal Achievement: 04/16/23 Progress towards PT goals: Progressing toward goals    Frequency    Min 3X/week      PT Plan      Co-evaluation              AM-PAC PT "6 Clicks" Mobility   Outcome Measure  Help needed turning from your back to your side while in a flat bed without using bedrails?: A Little Help needed moving from lying on your back to sitting on the side of a flat bed without using bedrails?: A Little Help needed moving to and from a bed to a chair (including a wheelchair)?: A Little Help needed standing up from a chair using your arms (e.g., wheelchair or bedside chair)?: A Little Help needed to walk in hospital room?: A Lot Help needed climbing 3-5 steps with a railing? : Total 6 Click Score: 15    End of Session   Activity Tolerance: Patient tolerated treatment well Patient left: with call bell/phone within reach;in chair;with chair alarm set Nurse Communication: Mobility status PT Visit Diagnosis: Muscle weakness (generalized) (M62.81);Difficulty in walking, not elsewhere classified (R26.2);Other abnormalities of gait and mobility (R26.89)     Time: 1610-9604 PT Time Calculation (min) (ACUTE ONLY): 18 min  Charges:    $Therapeutic Activity: 8-22 mins PT General Charges $$ ACUTE PT VISIT: 1 Visit                     Davion Meara R. PTA Acute Rehabilitation Services Office: [phone]   Catalina Antigua 04/03/2023, 2:37 PM

## 2023-04-04 ENCOUNTER — Other Ambulatory Visit (HOSPITAL_COMMUNITY): Payer: Self-pay

## 2023-04-04 LAB — CBC
HCT: 36.2 % (ref 36.0–46.0)
Hemoglobin: 11.7 g/dL — ABNORMAL LOW (ref 12.0–15.0)
MCH: 32.1 pg (ref 26.0–34.0)
MCHC: 32.3 g/dL (ref 30.0–36.0)
MCV: 99.5 fL (ref 80.0–100.0)
Platelets: 174 10*3/uL (ref 150–400)
RBC: 3.64 MIL/uL — ABNORMAL LOW (ref 3.87–5.11)
RDW: 15.3 % (ref 11.5–15.5)
WBC: 3.7 10*3/uL — ABNORMAL LOW (ref 4.0–10.5)
nRBC: 0 % (ref 0.0–0.2)

## 2023-04-04 LAB — BASIC METABOLIC PANEL
Anion gap: 6 (ref 5–15)
BUN: 6 mg/dL — ABNORMAL LOW (ref 8–23)
CO2: 25 mmol/L (ref 22–32)
Calcium: 8.2 mg/dL — ABNORMAL LOW (ref 8.9–10.3)
Chloride: 109 mmol/L (ref 98–111)
Creatinine, Ser: 0.84 mg/dL (ref 0.44–1.00)
GFR, Estimated: >60 mL/min (ref >60)
Glucose, Bld: 89 mg/dL (ref 70–99)
Potassium: 4.3 mmol/L (ref 3.5–5.1)
Sodium: 140 mmol/L (ref 135–145)

## 2023-04-04 LAB — MAGNESIUM: Magnesium: 2.4 mg/dL (ref 1.7–2.4)

## 2023-04-04 MED ORDER — DIVALPROEX SODIUM ER 250 MG PO TB24
750.0000 mg | ORAL_TABLET | Freq: Two times a day (BID) | ORAL | 0 refills | Status: DC
  Filled 2023-04-04: qty 140, 23d supply, fill #0
  Filled 2023-04-04: qty 40, 7d supply, fill #0

## 2023-04-04 MED ORDER — LORAZEPAM 1 MG PO TABS
1.0000 mg | ORAL_TABLET | Freq: Every day | ORAL | 0 refills | Status: DC | PRN
  Filled 2023-04-04: qty 10, 10d supply, fill #0

## 2023-04-04 NOTE — TOC Transition Note (Signed)
Transition of Care [facility]) - CM/SW Discharge Note   Patient Details  Name: Jaime Barry MRN: [id number] Date of Birth: Mar 10, 1941  Transition of Care [facility]) CM/SW Contact:  Gordy Clement, RN Phone Number: 04/04/2023, 9:07 AM   Clinical Narrative:     Patient will DC to home .  [facility] will provide Home Health PT.  There are no recommendations for DME.  Famuily to transport           Patient Goals and CMS Choice      Discharge Placement                         Discharge Plan and Services Additional resources added to the After Visit Summary for                                       Social Determinants of Health (SDOH) Interventions SDOH Screenings   Tobacco Use: Medium Risk (12/27/2022)     Readmission Risk Interventions     No data to display

## 2023-04-04 NOTE — Discharge Summary (Signed)
Jaime Barry SWF:[id number] DOB: 1941-07-01 DOA: 04/01/2023  PCP: Gweneth Dimitri, MD  Admit date: 04/01/2023  Discharge date: 04/04/2023  Admitted From: Home   Disposition:  Home   Recommendations for Outpatient Follow-up:   Follow up with PCP in 1-2 weeks  PCP Please obtain BMP/CBC, 2 view CXR in 1week,  (see Discharge instructions)   PCP Please follow up on the following pending results: Needs monitor valproic acid levels closely, needs close outpatient follow-up with her neurologist.   Home Health: PT   Equipment/Devices: None  Consultations: Neuro Discharge Condition: Stable    CODE STATUS: Full    Diet Recommendation: Heart Healthy     Chief Complaint  Patient presents with   Code Stroke     Brief history of present illness from the day of admission and additional interim summary    82 y.o. female with medical history significant of advanced dementia, seizure disorder on Depakote, cerebral aneurysm presented with seizure activity at home.                                                                  Hospital Course   Breakthrough Seizure - Fall, L Sided Peresis - in a patient with history of seizures, subtherapeutic Depakote level, question compliance at home, thankfully MRI brain, CTA head and neck nonacute, seen by neurology, placed on IV Depakote high-dose, continue to monitor if no further seizure activity and likely discharge in the next 1 to 2 days.    She was seen by PT OT and speech eval. L. Sided paresis has resolved, stable MRI brain, case discussed with neurology Dr. Selina Cooley she has nothing else to offer recommends increasing the home Depakote dose discharge patient with outpatient neurology and PCP follow-up.  She is today symptom-free husband bedside will be discharged home on higher than  home dose Depakote, PCP to monitor levels closely.    Hypotension.  Due to dehydration. Resolved post IV fluids.     Hypokalemia.  Replaced.     H/O dementia.  Does have severe metabolic encephalopathy, minimize narcotics and benzodiazepines, here was placed on PM Seroquel and  PRN Haldol.  This morning back to her baseline, husband bedside, discharged home with outpatient PCP follow-up.    Discharge diagnosis     Principal Problem:   Seizure [facility]) Active Problems:   Alzheimer's disease [facility])    Discharge instructions    Discharge Instructions     Diet - low sodium heart healthy   Complete by: As directed    Discharge instructions   Complete by: As directed    Do not drive, operate heavy machinery, perform activities at heights, swimming or participation in water activities or provide baby sitting services until you have seen by Primary MD or a Neurologist and advised to do  so again.  Follow with Primary MD Gweneth Dimitri, MD in 7 days   Get CBC, CMP, Valproic acid level -  checked next visit with your primary MD   Activity: As tolerated with Full fall precautions use walker/cane & assistance as needed  Disposition Home    Diet: Heart Healthy with feeding assistance and aspiration precautions.  Special Instructions: If you have smoked or chewed Tobacco  in the last 2 yrs please stop smoking, stop any regular Alcohol  and or any Recreational drug use.  On your next visit with your primary care physician please Get Medicines reviewed and adjusted.  Please request your Prim.MD to go over all Hospital Tests and Procedure/Radiological results at the follow up, please get all Hospital records sent to your Prim MD by signing hospital release before you go home.  If you experience worsening of your admission symptoms, develop shortness of breath, life threatening emergency, suicidal or homicidal thoughts you must seek medical attention immediately by calling 911 or calling your  MD immediately  if symptoms less severe.  You Must read complete instructions/literature along with all the possible adverse reactions/side effects for all the Medicines you take and that have been prescribed to you. Take any new Medicines after you have completely understood and accpet all the possible adverse reactions/side effects.   Increase activity slowly   Complete by: As directed        Discharge Medications   Allergies as of 04/04/2023   No Known Allergies      Medication List     TAKE these medications    Calcium 600-200 MG-UNIT tablet Take 1 tablet by mouth daily.   denosumab 60 MG/ML Soln injection Commonly known as: PROLIA Inject 60 mg into the skin every 6 (six) months. Administer in upper arm, thigh, or abdomen   divalproex 250 MG 24 hr tablet Commonly known as: DEPAKOTE ER Take 3 tablets (750 mg total) by mouth 2 (two) times daily. What changed:  medication strength how much to take   LORazepam 1 MG tablet Commonly known as: Ativan Take 1 tablet (1 mg total) by mouth daily as needed for seizure.   memantine 21 MG Cp24 24 hr capsule Commonly known as: NAMENDA XR TAKE 1 CAPSULE(21 MG) BY MOUTH DAILY         Follow-up Information     Gweneth Dimitri, MD. Schedule an appointment as soon as possible for a visit in 1 week(s).   Specialty: Family Medicine Contact information: [address] [place] [address] [phone]         [facility]. Schedule an appointment as soon as possible for a visit in 1 week(s).   Why: seizures Contact information: [address]     [address] [place] [address] [phone]                Major procedures and Radiology Reports - PLEASE review detailed and final reports thoroughly  -        MR BRAIN WO CONTRAST  Result Date: 04/01/2023 CLINICAL DATA:  Stroke suspected EXAM: MRI HEAD WITHOUT CONTRAST TECHNIQUE: Multiplanar, multiecho pulse sequences of  the brain and surrounding structures were obtained without intravenous contrast. COMPARISON:  08/30/2012 MRI head, correlation is also made with CT head 04/01/2023 FINDINGS: Evaluation is mildly limited by susceptibility artifact from the patient's right ICA aneurysm clip. Brain: No restricted diffusion to suggest acute or subacute infarct. No acute hemorrhage, mass, mass effect, or midline shift. No hydrocephalus  or extra-axial collection. Normal pituitary and craniocervical junction. No hemosiderin deposition to suggest remote hemorrhage. Age related cerebral atrophy. Confluent T2 hyperintense signal in the periventricular white matter, likely the sequela of moderate chronic small vessel ischemic disease. Vascular: Normal arterial flow voids. Susceptibility from right ICA aneurysm clips. Skull and upper cervical spine: Normal marrow signal. Sequela of prior right frontotemporal craniotomy. Sinuses/Orbits: Mucosal thickening ethmoid air cells. No acute finding in the orbits. Other: The mastoid air cells are well aerated. IMPRESSION: No acute intracranial process. No evidence of acute or subacute infarct. Electronically Signed   By: Wiliam Ke M.D.   On: 04/01/2023 19:22   CT C-SPINE NO CHARGE  Result Date: 04/01/2023 CLINICAL DATA:  Seizures.  Found down. EXAM: CT CERVICAL SPINE WITH CONTRAST TECHNIQUE: Multiplanar CT images of the cervical spine were reconstructed from contemporary CTA of the Neck. RADIATION DOSE REDUCTION: This exam was performed according to the departmental dose-optimization program which includes automated exposure control, adjustment of the mA and/or kV according to patient size and/or use of iterative reconstruction technique. CONTRAST:  No additional. COMPARISON:  None Available. FINDINGS: Alignment: Straightening of the normal cervical lordosis. Trace anterolisthesis of C3 on C4 and trace retrolisthesis of C5 on C6, likely degenerative. Skull base and vertebrae: No acute cervical  spine fracture or suspicious osseous lesion. Mild compression deformity of the T3 superior endplate, favored to be chronic. Soft tissues and spinal canal: No prevertebral fluid or swelling. No visible canal hematoma. Disc levels: Moderate to severe disc degeneration from C3-4 through C6-7. Multilevel facet arthrosis, most severe on the left at C3-4 and C4-5. Mild-to-moderate spinal stenosis at C5-6 and C6-7. Moderate neural foraminal stenosis bilaterally at C5-6 and C6-7 and on the left at C3-4. Upper chest: No mass or consolidation in the lung apices. Other: None. IMPRESSION: 1. No acute cervical spine fracture. 2. Advanced cervical disc and facet degeneration. Electronically Signed   By: Sebastian Ache M.D.   On: 04/01/2023 17:15   CT ANGIO HEAD NECK W WO CM W PERF (CODE STROKE)  Result Date: 04/01/2023 CLINICAL DATA:  Neuro deficit, acute, stroke suspected. Left-sided weakness. EXAM: CT ANGIOGRAPHY HEAD AND NECK CT PERFUSION BRAIN TECHNIQUE: Multidetector CT imaging of the head and neck was performed using the standard protocol during bolus administration of intravenous contrast. Multiplanar CT image reconstructions and MIPs were obtained to evaluate the vascular anatomy. Carotid stenosis measurements (when applicable) are obtained utilizing NASCET criteria, using the distal internal carotid diameter as the denominator. Multiphase CT imaging of the brain was performed following IV bolus contrast injection. Subsequent parametric perfusion maps were calculated using RAPID software. RADIATION DOSE REDUCTION: This exam was performed according to the departmental dose-optimization program which includes automated exposure control, adjustment of the mA and/or kV according to patient size and/or use of iterative reconstruction technique. CONTRAST:  OMNIPAQUE IOHEXOL 350 MG/ML SOLN COMPARISON:  CTA head 08/17/2010 FINDINGS: CTA NECK FINDINGS Aortic arch: Standard 3 vessel aortic arch with mild atherosclerotic  plaque. No stenosis of the arch vessel origins. Right carotid system: Patent with predominantly calcified plaque at the carotid bifurcation resulting in severe stenosis of the ECA origin but no significant ICA or common carotid artery stenosis. Tortuous distal cervical ICA. Left carotid system: Patent with a small amount of calcified plaque at the carotid bifurcation. No significant stenosis. Vertebral arteries: Patent without evidence of stenosis or dissection. Mildly dominant right vertebral artery. Skeleton: Advanced cervical disc and facet degeneration. Mild compression deformity of the T3 superior endplate, chronic  in appearance. Other neck: No evidence of cervical lymphadenopathy or mass. Upper chest: No mass or consolidation in the included lung apices. Review of the MIP images confirms the above findings CTA HEAD FINDINGS Anterior circulation: The internal carotid arteries are widely patent from skull base to carotid termini with prominent tortuosity of the cavernous segments. There has been interval clipping of a right supraclinoid ICA aneurysm with some peripheral calcification of the treated aneurysm but no definite aneurysm opacification. ACAs and MCAs are patent without evidence of a proximal branch occlusion or significant proximal stenosis. Posterior circulation: The intracranial vertebral arteries are widely patent to the basilar. The basilar artery is widely patent. There is a large right posterior communicating artery with hypoplasia of the right P1 segment. Both PCAs are patent without evidence of a significant proximal stenosis. No aneurysm is identified. Venous sinuses: As permitted by contrast timing, patent. Anatomic variants: Fetal right PCA.  Hypoplastic right A1 segment. Review of the MIP images confirms the above findings CT Brain Perfusion Findings: ASPECTS: 10 CBF (<30%) Volume: 0 mL Perfusion (Tmax>6.0s) volume: 0 mL Mismatch Volume: 0 mL Infarction Location: None identified by CTP.  These results were communicated to Dr. Amada Jupiter at 4:37 pm on 04/01/2023 by text page via the [facility] messaging system. IMPRESSION: 1. No large vessel occlusion. 2. No evidence of acute infarct or ischemic penumbra on CTP. 3. Prior clipping of a right supraclinoid ICA aneurysm. 4. Cervical carotid atherosclerosis with severe stenosis of the right ECA origin but no significant common or internal carotid artery stenosis. 5.  Aortic Atherosclerosis (ICD10-I70.0). Electronically Signed   By: Sebastian Ache M.D.   On: 04/01/2023 16:48   EEG adult  Result Date: 04/01/2023 Charlsie Quest, MD     04/01/2023  5:36 PM Patient Name: TYSHEMA KULT MRN: [id number] Epilepsy Attending: Charlsie Quest Referring Physician/Provider: Mathews Argyle, NP Date: 04/01/2023 Duration: 22.40 mins Patient history: Patient BIB [facility] from home for focal seizures.EEG to evaluate for seizure Level of alertness: Awake AEDs during EEG study: VPA Technical aspects: This EEG study was done with scalp electrodes positioned according to the 10-20 International system of electrode placement. Electrical activity was reviewed with band pass filter of 1-70Hz , sensitivity of 7 uV/mm, display speed of 77mm/sec with a 60Hz  notched filter applied as appropriate. EEG data were recorded continuously and digitally stored.  Video monitoring was available and reviewed as appropriate. Description: The posterior dominant rhythm consists of 8.5 Hz activity of moderate voltage (25-35 uV) seen predominantly in posterior head regions, symmetric and reactive to eye opening and eye closing. EEG showed continuous 3 to 6 Hz theta-delta slowing with overriding 13-15hz  beta activity in right fronto-temporal region consistent with breach artifact.   Hyperventilation and photic stimulation were not performed.   ABNORMALITY - Breach artifact, right fronto-temporal region IMPRESSION: This study is suggestive of cortical dysfunction arising from right fronto-temporal region  consistent with underlying craniotomy. No seizures or epileptiform discharges were seen throughout the recording. Charlsie Quest   CT HEAD CODE STROKE WO CONTRAST  Result Date: 04/01/2023 CLINICAL DATA:  Code stroke. Neuro deficit, acute, stroke suspected. Left-sided weakness. EXAM: CT HEAD WITHOUT CONTRAST TECHNIQUE: Contiguous axial images were obtained from the base of the skull through the vertex without intravenous contrast. RADIATION DOSE REDUCTION: This exam was performed according to the departmental dose-optimization program which includes automated exposure control, adjustment of the mA and/or kV according to patient size and/or use of iterative reconstruction technique. COMPARISON:  Head CT 08/29/2012  and MRI 08/30/2012 FINDINGS: Brain: There is no evidence of an acute infarct, intracranial hemorrhage, mass, midline shift, or extra-axial fluid collection. Patchy hypodensities in the cerebral white matter bilaterally have progressed and are nonspecific but compatible with moderate chronic small vessel ischemic disease. There is progressive, mild-to-moderate cerebral atrophy. Vascular: Prior right ICA aneurysm clipping. Skull: Right frontotemporal craniotomy. Sinuses/Orbits: Minor mucosal thickening in the paranasal sinuses. Clear mastoid air cells. Unremarkable orbits. Other: None. ASPECTS (Alberta Stroke Program Early CT Score) - Ganglionic level infarction (caudate, lentiform nuclei, internal capsule, insula, M1-M3 cortex): 7 - Supraganglionic infarction (M4-M6 cortex): 3 Total score (0-10 with 10 being normal): 10 These results were communicated to Dr. Amada Jupiter at 4:07 pm on 04/01/2023 by text page via the [facility] messaging system. IMPRESSION: 1. No evidence of acute intracranial abnormality. ASPECTS of 10. 2. Moderate chronic small vessel ischemic disease. Electronically Signed   By: Sebastian Ache M.D.   On: 04/01/2023 16:07    Micro Results     Recent Results (from the past 240 hour(s))   SARS Coronavirus 2 by RT PCR (hospital order, performed in [facility] hospital lab) *cepheid single result test* Anterior Nasal Swab     Status: None   Collection Time: 04/01/23  4:30 PM   Specimen: Anterior Nasal Swab  Result Value Ref Range Status   SARS Coronavirus 2 by RT PCR NEGATIVE NEGATIVE Final    Comment: Performed at [facility], [address], [place], [place] [address]    Today   Subjective    Jaime Barry today has no headache,no chest abdominal pain,no new weakness tingling or numbness, feels much better wants to go home today.     Objective   Blood pressure (!) 100/57, pulse 82, temperature 98 F (36.7 C), temperature source Oral, resp. rate 18, height 5' (1.524 m), weight 55.3 kg, SpO2 97 %.   Intake/Output Summary (Last 24 hours) at 04/04/2023 0830 Last data filed at 04/04/2023 0400 Gross per 24 hour  Intake 966.67 ml  Output 1950 ml  Net -983.33 ml    Exam  Awake Alert, No new F.N deficits,    [place].AT,PERRAL Supple Neck,   Symmetrical Chest wall movement, Good air movement bilaterally, CTAB RRR,No Gallops,   +ve B.Sounds, Abd Soft, Non tender,  No Cyanosis, Clubbing or edema    Data Review   Recent Labs  Lab 04/01/23 1546 04/01/23 1549 04/03/23 0339 04/04/23 0548  WBC 4.6  --  4.3 3.7*  HGB 11.7* 12.6 10.5* 11.7*  HCT 38.0 37.0 32.5* 36.2  PLT 136*  --  158 174  MCV 103.5*  --  101.2* 99.5  MCH 31.9  --  32.7 32.1  MCHC 30.8  --  32.3 32.3  RDW 15.2  --  15.2 15.3  LYMPHSABS 0.9  --  1.6  --   MONOABS 0.2  --  0.4  --   EOSABS 0.0  --  0.1  --   BASOSABS 0.0  --  0.0  --     Recent Labs  Lab 04/01/23 1546 04/01/23 1549 04/03/23 0339 04/04/23 0548  NA 138 141 142 140  K 3.9 3.5 2.9* 4.3  CL 109 109 111 109  CO2 20*  --  24 25  ANIONGAP 9  --  7 6  GLUCOSE 129* 127* 89 89  BUN 13 13 <5* 6*  CREATININE 0.84 0.70 0.62 0.84  AST 24  --   --   --   ALT 8  --   --   --  ALKPHOS 27*  --   --   --   BILITOT 0.8  --   --   --    ALBUMIN 3.2*  --   --   --   INR 1.0  --   --   --   MG  --   --  1.9 2.4  CALCIUM 8.2*  --  7.6* 8.2*    Total Time in preparing paper work, data evaluation and todays exam - 35 minutes  Signature  -    Susa Raring M.D on 04/04/2023 at 8:30 AM   -  To page go to [email or web address]

## 2023-04-04 NOTE — Discharge Instructions (Signed)
Do not drive, operate heavy machinery, perform activities at heights, swimming or participation in water activities or provide baby sitting services until you have seen by Primary MD or a Neurologist and advised to do so again.  Follow with Primary MD Jaime Dimitri, MD in 7 days   Get CBC, CMP, Valproic acid level -  checked next visit with your primary MD   Activity: As tolerated with Full fall precautions use walker/cane & assistance as needed  Disposition Home    Diet: Heart Healthy with feeding assistance and aspiration precautions.  Special Instructions: If you have smoked or chewed Tobacco  in the last 2 yrs please stop smoking, stop any regular Alcohol  and or any Recreational drug use.  On your next visit with your primary care physician please Get Medicines reviewed and adjusted.  Please request your Prim.MD to go over all Hospital Tests and Procedure/Radiological results at the follow up, please get all Hospital records sent to your Prim MD by signing hospital release before you go home.  If you experience worsening of your admission symptoms, develop shortness of breath, life threatening emergency, suicidal or homicidal thoughts you must seek medical attention immediately by calling 911 or calling your MD immediately  if symptoms less severe.  You Must read complete instructions/literature along with all the possible adverse reactions/side effects for all the Medicines you take and that have been prescribed to you. Take any new Medicines after you have completely understood and accpet all the possible adverse reactions/side effects.

## 2023-04-08 DIAGNOSIS — Z9181 History of falling: Secondary | ICD-10-CM | POA: Diagnosis not present

## 2023-04-08 DIAGNOSIS — M47812 Spondylosis without myelopathy or radiculopathy, cervical region: Secondary | ICD-10-CM | POA: Diagnosis not present

## 2023-04-08 DIAGNOSIS — G9341 Metabolic encephalopathy: Secondary | ICD-10-CM | POA: Diagnosis not present

## 2023-04-08 DIAGNOSIS — G40919 Epilepsy, unspecified, intractable, without status epilepticus: Secondary | ICD-10-CM | POA: Diagnosis not present

## 2023-04-08 DIAGNOSIS — G309 Alzheimer's disease, unspecified: Secondary | ICD-10-CM | POA: Diagnosis not present

## 2023-04-08 DIAGNOSIS — I700 Atherosclerosis of aorta: Secondary | ICD-10-CM | POA: Diagnosis not present

## 2023-04-08 DIAGNOSIS — E876 Hypokalemia: Secondary | ICD-10-CM | POA: Diagnosis not present

## 2023-04-08 DIAGNOSIS — M5080 Other cervical disc disorders, unspecified cervical region: Secondary | ICD-10-CM | POA: Diagnosis not present

## 2023-04-08 DIAGNOSIS — F0280 Dementia in other diseases classified elsewhere without behavioral disturbance: Secondary | ICD-10-CM | POA: Diagnosis not present

## 2023-04-16 DIAGNOSIS — D7589 Other specified diseases of blood and blood-forming organs: Secondary | ICD-10-CM | POA: Diagnosis not present

## 2023-04-16 DIAGNOSIS — I700 Atherosclerosis of aorta: Secondary | ICD-10-CM | POA: Diagnosis not present

## 2023-04-16 DIAGNOSIS — F03B18 Unspecified dementia, moderate, with other behavioral disturbance: Secondary | ICD-10-CM | POA: Diagnosis not present

## 2023-04-16 DIAGNOSIS — E8809 Other disorders of plasma-protein metabolism, not elsewhere classified: Secondary | ICD-10-CM | POA: Diagnosis not present

## 2023-04-16 DIAGNOSIS — M810 Age-related osteoporosis without current pathological fracture: Secondary | ICD-10-CM | POA: Diagnosis not present

## 2023-04-16 DIAGNOSIS — G40309 Generalized idiopathic epilepsy and epileptic syndromes, not intractable, without status epilepticus: Secondary | ICD-10-CM | POA: Diagnosis not present

## 2023-04-16 DIAGNOSIS — R32 Unspecified urinary incontinence: Secondary | ICD-10-CM | POA: Diagnosis not present

## 2023-04-21 ENCOUNTER — Emergency Department (HOSPITAL_COMMUNITY): Payer: Medicare PPO

## 2023-04-21 ENCOUNTER — Inpatient Hospital Stay (HOSPITAL_COMMUNITY)
Admission: EM | Admit: 2023-04-21 | Discharge: 2023-05-03 | DRG: 951 | Disposition: E | Payer: Medicare PPO | Attending: Internal Medicine | Admitting: Internal Medicine

## 2023-04-21 DIAGNOSIS — G40309 Generalized idiopathic epilepsy and epileptic syndromes, not intractable, without status epilepticus: Secondary | ICD-10-CM | POA: Diagnosis not present

## 2023-04-21 DIAGNOSIS — M810 Age-related osteoporosis without current pathological fracture: Secondary | ICD-10-CM | POA: Diagnosis present

## 2023-04-21 DIAGNOSIS — Z66 Do not resuscitate: Secondary | ICD-10-CM | POA: Diagnosis present

## 2023-04-21 DIAGNOSIS — Z79899 Other long term (current) drug therapy: Secondary | ICD-10-CM | POA: Diagnosis not present

## 2023-04-21 DIAGNOSIS — J690 Pneumonitis due to inhalation of food and vomit: Secondary | ICD-10-CM | POA: Diagnosis present

## 2023-04-21 DIAGNOSIS — Z4682 Encounter for fitting and adjustment of non-vascular catheter: Secondary | ICD-10-CM | POA: Diagnosis not present

## 2023-04-21 DIAGNOSIS — I469 Cardiac arrest, cause unspecified: Secondary | ICD-10-CM | POA: Diagnosis present

## 2023-04-21 DIAGNOSIS — Z8679 Personal history of other diseases of the circulatory system: Secondary | ICD-10-CM | POA: Diagnosis not present

## 2023-04-21 DIAGNOSIS — R404 Transient alteration of awareness: Secondary | ICD-10-CM | POA: Diagnosis not present

## 2023-04-21 DIAGNOSIS — F0280 Dementia in other diseases classified elsewhere without behavioral disturbance: Secondary | ICD-10-CM | POA: Diagnosis present

## 2023-04-21 DIAGNOSIS — R0902 Hypoxemia: Secondary | ICD-10-CM | POA: Diagnosis not present

## 2023-04-21 DIAGNOSIS — Z515 Encounter for palliative care: Principal | ICD-10-CM

## 2023-04-21 DIAGNOSIS — G40409 Other generalized epilepsy and epileptic syndromes, not intractable, without status epilepticus: Secondary | ICD-10-CM | POA: Diagnosis present

## 2023-04-21 DIAGNOSIS — R0689 Other abnormalities of breathing: Secondary | ICD-10-CM | POA: Diagnosis not present

## 2023-04-21 DIAGNOSIS — E872 Acidosis, unspecified: Secondary | ICD-10-CM | POA: Diagnosis present

## 2023-04-21 DIAGNOSIS — F02A0 Dementia in other diseases classified elsewhere, mild, without behavioral disturbance, psychotic disturbance, mood disturbance, and anxiety: Secondary | ICD-10-CM | POA: Diagnosis present

## 2023-04-21 DIAGNOSIS — I700 Atherosclerosis of aorta: Secondary | ICD-10-CM | POA: Diagnosis not present

## 2023-04-21 DIAGNOSIS — Z820 Family history of epilepsy and other diseases of the nervous system: Secondary | ICD-10-CM

## 2023-04-21 DIAGNOSIS — R4182 Altered mental status, unspecified: Secondary | ICD-10-CM | POA: Diagnosis not present

## 2023-04-21 DIAGNOSIS — Z87891 Personal history of nicotine dependence: Secondary | ICD-10-CM | POA: Diagnosis not present

## 2023-04-21 DIAGNOSIS — I3481 Nonrheumatic mitral (valve) annulus calcification: Secondary | ICD-10-CM | POA: Diagnosis not present

## 2023-04-21 DIAGNOSIS — I6782 Cerebral ischemia: Secondary | ICD-10-CM | POA: Diagnosis not present

## 2023-04-21 DIAGNOSIS — R918 Other nonspecific abnormal finding of lung field: Secondary | ICD-10-CM | POA: Diagnosis not present

## 2023-04-21 DIAGNOSIS — R55 Syncope and collapse: Secondary | ICD-10-CM | POA: Diagnosis not present

## 2023-04-21 DIAGNOSIS — I671 Cerebral aneurysm, nonruptured: Secondary | ICD-10-CM | POA: Diagnosis not present

## 2023-04-21 DIAGNOSIS — G309 Alzheimer's disease, unspecified: Secondary | ICD-10-CM | POA: Diagnosis present

## 2023-04-21 DIAGNOSIS — Z7189 Other specified counseling: Secondary | ICD-10-CM | POA: Diagnosis not present

## 2023-04-21 DIAGNOSIS — S2243XA Multiple fractures of ribs, bilateral, initial encounter for closed fracture: Secondary | ICD-10-CM | POA: Diagnosis not present

## 2023-04-21 LAB — I-STAT CHEM 8, ED
BUN: 20 mg/dL (ref 8–23)
Calcium, Ion: 1.07 mmol/L — ABNORMAL LOW (ref 1.15–1.40)
Chloride: 106 mmol/L (ref 98–111)
Creatinine, Ser: 1 mg/dL (ref 0.44–1.00)
Glucose, Bld: 252 mg/dL — ABNORMAL HIGH (ref 70–99)
HCT: 38 % (ref 36.0–46.0)
Hemoglobin: 12.9 g/dL (ref 12.0–15.0)
Potassium: 4.1 mmol/L (ref 3.5–5.1)
Sodium: 140 mmol/L (ref 135–145)
TCO2: 19 mmol/L — ABNORMAL LOW (ref 22–32)

## 2023-04-21 LAB — CBC WITH DIFFERENTIAL/PLATELET
Abs Immature Granulocytes: 0 10*3/uL (ref 0.00–0.07)
Basophils Absolute: 0 10*3/uL (ref 0.0–0.1)
Basophils Relative: 0 %
Eosinophils Absolute: 0 10*3/uL (ref 0.0–0.5)
Eosinophils Relative: 0 %
HCT: 42.6 % (ref 36.0–46.0)
Hemoglobin: 12.4 g/dL (ref 12.0–15.0)
Lymphocytes Relative: 49 %
Lymphs Abs: 7.6 10*3/uL — ABNORMAL HIGH (ref 0.7–4.0)
MCH: 33.2 pg (ref 26.0–34.0)
MCHC: 29.1 g/dL — ABNORMAL LOW (ref 30.0–36.0)
MCV: 114.2 fL — ABNORMAL HIGH (ref 80.0–100.0)
Monocytes Absolute: 0.3 10*3/uL (ref 0.1–1.0)
Monocytes Relative: 2 %
Neutro Abs: 7.6 10*3/uL (ref 1.7–7.7)
Neutrophils Relative %: 49 %
Platelets: 113 10*3/uL — ABNORMAL LOW (ref 150–400)
RBC: 3.73 MIL/uL — ABNORMAL LOW (ref 3.87–5.11)
RDW: 14.6 % (ref 11.5–15.5)
WBC: 15.6 10*3/uL — ABNORMAL HIGH (ref 4.0–10.5)
nRBC: 0 /100 WBC
nRBC: 0.2 % (ref 0.0–0.2)

## 2023-04-21 LAB — COMPREHENSIVE METABOLIC PANEL
ALT: 298 U/L — ABNORMAL HIGH (ref 0–44)
AST: 461 U/L — ABNORMAL HIGH (ref 15–41)
Albumin: 2.6 g/dL — ABNORMAL LOW (ref 3.5–5.0)
Alkaline Phosphatase: 53 U/L (ref 38–126)
Anion gap: 17 — ABNORMAL HIGH (ref 5–15)
BUN: 17 mg/dL (ref 8–23)
CO2: 16 mmol/L — ABNORMAL LOW (ref 22–32)
Calcium: 7.9 mg/dL — ABNORMAL LOW (ref 8.9–10.3)
Chloride: 105 mmol/L (ref 98–111)
Creatinine, Ser: 1.19 mg/dL — ABNORMAL HIGH (ref 0.44–1.00)
GFR, Estimated: 47 mL/min — ABNORMAL LOW (ref >60)
Glucose, Bld: 261 mg/dL — ABNORMAL HIGH (ref 70–99)
Potassium: 4.2 mmol/L (ref 3.5–5.1)
Sodium: 138 mmol/L (ref 135–145)
Total Bilirubin: 0.6 mg/dL (ref 0.3–1.2)
Total Protein: 5.2 g/dL — ABNORMAL LOW (ref 6.5–8.1)

## 2023-04-21 LAB — URINALYSIS, ROUTINE W REFLEX MICROSCOPIC
Bilirubin Urine: NEGATIVE
Glucose, UA: 150 mg/dL — AB
Ketones, ur: NEGATIVE mg/dL
Leukocytes,Ua: NEGATIVE
Nitrite: NEGATIVE
Protein, ur: 100 mg/dL — AB
Specific Gravity, Urine: 1.024 (ref 1.005–1.030)
pH: 6 (ref 5.0–8.0)

## 2023-04-21 LAB — I-STAT VENOUS BLOOD GAS, ED
Acid-base deficit: 17 mmol/L — ABNORMAL HIGH (ref 0.0–2.0)
Bicarbonate: 16.9 mmol/L — ABNORMAL LOW (ref 20.0–28.0)
Calcium, Ion: 1.07 mmol/L — ABNORMAL LOW (ref 1.15–1.40)
HCT: 39 % (ref 36.0–46.0)
Hemoglobin: 13.3 g/dL (ref 12.0–15.0)
O2 Saturation: 88 %
Potassium: 4.1 mmol/L (ref 3.5–5.1)
Sodium: 139 mmol/L (ref 135–145)
TCO2: 19 mmol/L — ABNORMAL LOW (ref 22–32)
pCO2, Ven: 81.1 mmHg (ref 44–60)
pH, Ven: 6.927 — CL (ref 7.25–7.43)
pO2, Ven: 90 mmHg — ABNORMAL HIGH (ref 32–45)

## 2023-04-21 LAB — CBG MONITORING, ED: Glucose-Capillary: 252 mg/dL — ABNORMAL HIGH (ref 70–99)

## 2023-04-21 LAB — TROPONIN I (HIGH SENSITIVITY): Troponin I (High Sensitivity): 104 ng/L (ref <18)

## 2023-04-21 MED ORDER — SODIUM CHLORIDE 0.9 % IV SOLN
100.0000 mg | Freq: Once | INTRAVENOUS | Status: DC
  Administered 2023-04-21: 100 mg via INTRAVENOUS
  Filled 2023-04-21: qty 100

## 2023-04-21 MED ORDER — GLYCOPYRROLATE 0.2 MG/ML IJ SOLN: 0.2000 mg | INTRAMUSCULAR | Status: DC | PRN

## 2023-04-21 MED ORDER — NOREPINEPHRINE 4 MG/250ML-% IV SOLN: 2.0000 ug/min | INTRAVENOUS | Status: DC

## 2023-04-21 MED ORDER — FENTANYL CITRATE PF 50 MCG/ML IJ SOSY
100.0000 ug | PREFILLED_SYRINGE | Freq: Once | INTRAMUSCULAR | Status: AC
  Administered 2023-04-21: 100 ug via INTRAVENOUS

## 2023-04-21 MED ORDER — PHENYLEPHRINE HCL-NACL 20-0.9 MG/250ML-% IV SOLN: 25.0000 ug/min | INTRAVENOUS | Status: DC

## 2023-04-21 MED ORDER — NOREPINEPHRINE 4 MG/250ML-% IV SOLN
INTRAVENOUS | Status: AC
  Filled 2023-04-21: qty 250

## 2023-04-21 MED ORDER — GLYCOPYRROLATE 0.2 MG/ML IJ SOLN
0.2000 mg | INTRAMUSCULAR | Status: DC | PRN
  Administered 2023-04-22: 0.2 mg via INTRAVENOUS
  Filled 2023-04-21: qty 1

## 2023-04-21 MED ORDER — ACETAMINOPHEN 325 MG PO TABS: 650.0000 mg | ORAL_TABLET | Freq: Four times a day (QID) | ORAL | Status: DC | PRN

## 2023-04-21 MED ORDER — FENTANYL BOLUS VIA INFUSION
100.0000 ug | INTRAVENOUS | Status: DC | PRN
  Administered 2023-04-21 (×2): 100 ug via INTRAVENOUS

## 2023-04-21 MED ORDER — IOHEXOL 350 MG/ML SOLN
75.0000 mL | Freq: Once | INTRAVENOUS | Status: AC | PRN
  Administered 2023-04-21: 75 mL via INTRAVENOUS

## 2023-04-21 MED ORDER — MIDAZOLAM HCL 2 MG/2ML IJ SOLN
2.0000 mg | INTRAMUSCULAR | Status: DC | PRN
  Administered 2023-04-21 (×2): 2 mg via INTRAVENOUS
  Filled 2023-04-21 (×2): qty 2

## 2023-04-21 MED ORDER — SODIUM CHLORIDE 0.9 % IV BOLUS
1000.0000 mL | Freq: Once | INTRAVENOUS | Status: AC
  Administered 2023-04-21: 1000 mL via INTRAVENOUS

## 2023-04-21 MED ORDER — FENTANYL 2500MCG IN NS 250ML (10MCG/ML) PREMIX INFUSION: 0.0000 ug/h | INTRAVENOUS | Status: DC

## 2023-04-21 MED ORDER — SODIUM CHLORIDE 0.9 % IV SOLN
2.0000 g | Freq: Once | INTRAVENOUS | Status: AC
  Administered 2023-04-21: 2 g via INTRAVENOUS
  Filled 2023-04-21: qty 20

## 2023-04-21 MED ORDER — ACETAMINOPHEN 650 MG RE SUPP: 650.0000 mg | Freq: Four times a day (QID) | RECTAL | Status: DC | PRN

## 2023-04-21 MED ORDER — SODIUM CHLORIDE 0.9 % IV SOLN: INTRAVENOUS | Status: DC

## 2023-04-21 MED ORDER — POLYVINYL ALCOHOL 1.4 % OP SOLN: 1.0000 [drp] | Freq: Four times a day (QID) | OPHTHALMIC | Status: DC | PRN

## 2023-04-21 MED ORDER — FENTANYL 2500MCG IN NS 250ML (10MCG/ML) PREMIX INFUSION
0.0000 ug/h | INTRAVENOUS | Status: DC
  Administered 2023-04-21: 25 ug/h via INTRAVENOUS

## 2023-04-21 MED ORDER — GLYCOPYRROLATE 1 MG PO TABS: 1.0000 mg | ORAL_TABLET | ORAL | Status: DC | PRN

## 2023-04-21 MED ORDER — SODIUM CHLORIDE 0.9 % IV SOLN
250.0000 mL | INTRAVENOUS | Status: DC
  Administered 2023-04-21: 250 mL via INTRAVENOUS

## 2023-04-21 MED ORDER — FENTANYL 2500MCG IN NS 250ML (10MCG/ML) PREMIX INFUSION
INTRAVENOUS | Status: AC
  Filled 2023-04-21: qty 250

## 2023-04-21 NOTE — ED Notes (Signed)
OG was attempted x 3 with no success, OG tube continually coiled in back of the throat.

## 2023-04-21 NOTE — Consult Note (Signed)
   NAME:  Jaime Barry, MRN:  [id number], DOB:  08/30/1941, LOS: 0 ADMISSION DATE:  05-19-2023, CONSULTATION DATE:  May 19, 2023 REFERRING MD:  Renaye Rakers , CHIEF COMPLAINT:  cardiac arrest.   History of Present Illness:  82 year old woman with unwitnessed cardiac arrest, likely due to seizure. Received 40 min of CPR prior to ROSC. Apparently had been having adjusting her VPA levels and had been holding medications.   History of progressive dementia, but had been able to stay alone during day.  Intubated in ED. Started on fentanyl for rib fractures.  Pertinent  Medical History   Past Medical History:  Diagnosis Date   Altered mental state 08/29/2012   Dementia (HCC)    Dementia (HCC)    progressive    H/O cerebral aneurysm repair    in December of 2011   Lower leg edema    Osteoporosis    Seizure (HCC)    Seizures (HCC) 08/29/2012   Spinal stenosis      Significant Hospital Events: Including procedures, antibiotic start and stop dates in addition to other pertinent events   05-19-2023 - cardiac arrest PEA.  Interim History / Subjective:  Husband has copy of patient's living will requesting limitations in care in case of incurable disease.   Objective   Blood pressure 111/63, pulse 98, temperature (!) 92.5 F (33.6 C), resp. rate (!) 21, height 5\' 2"  (1.575 m), SpO2 100%.    Vent Mode: PRVC FiO2 (%):  [100 %] 100 % Set Rate:  [20 bmp] 20 bmp Vt Set:  [400 mL] 400 mL PEEP:  [5 cmH20] 5 cmH20 Plateau Pressure:  [16 cmH20] 16 cmH20  No intake or output data in the 24 hours ending 05/19/23 2113 There were no vitals filed for this visit.  Examination: General: Frail HENT: orally intubated Lungs: Clear Cardiovascular: HS normal  Abdomen: soft  Extremities: edema Neuro: no response to painful stimulation. Pupils 3mm but unreactive. No cough GU: clear urine  Ancillary tests:  CT head normal CT chest no PE, mucus impaction for aspiration.   Assessment & Plan:  Prolonged  cardiac arrest due to aspiration from suspected seizure in frail patient with dementia.  No realistic expectation of meaningful neurological recovery. I  believe additional observation period would only delay coming to same conclusion.  I have reviewed the patient's advanced directive and the patient does indeed have and uncurable condition for which transition to comfort care is the most appropriate course of action.  The patient's husband is ready to withdraw at this time and there is no additional family to visit at present.  Continue fentanyl for comfort.   Best Practice (right click and "Reselect all SmartList Selections" daily)   Diet/type: NPO DVT prophylaxis: not indicated GI prophylaxis: N/A Lines: N/A Foley:  Yes, and it is still needed Code Status:  DNR Last date of multidisciplinary goals of care discussion [Discussed with husband at bedside]  Lynnell Catalan, MD [facility] ICU Physician [facility]  Pager: [phone] Or Epic Secure Chat After hours: [phone].  May 19, 2023, 9:27 PM

## 2023-04-21 NOTE — Significant Event (Signed)
[facility] Interventional Cardiology Note  Date: 08-May-2023 Time: 7:58 PM  I was contacted by Dr. Renaye Rakers re: Jaime Barry.  She reportedly was found unresponsive by her husband while eating her dinner.  He had been away from her for ~10 minutes when he found her on the ground with food in her mouth, unresponsive.  When EMS arrived, she was in PEA arrest and required ~40 minutes of CPR/ACLS before ROSC was obtained.  She was recently admitted with concern for stroke but was ultimately thought to have had a seizure in the setting of subtherapeutic Depakote level.  She also has history of dementia.  EKG in the ED today shows sinus tachycardia with incomplete RBBB, low voltage, and nonspecific ST abnormalities.  It does NOT meet STEMI criteria.  Given the patient's comorbidities, prolonged cardiac arrest (PEA) requiring ~40 minutes of CPR, and EKG that does not meet STEMI criteria, she is not a candidate for emergent cardiac catheterization.  Recommended continued workup per ED team.  General cardiology team can be consulted if further cardiac questions or concerns arise.  Yvonne Kendall, MD [facility]

## 2023-04-21 NOTE — ED Triage Notes (Signed)
Pt was eating dinner and husband left for 10 mins and pt was found down with food in her moth and unresponsive, medic arived with a potential of of total down time before pulses were gained. Medic gave x2 epi, with a 150bpm

## 2023-04-21 NOTE — Code Documentation (Addendum)
Pt was eating dinner and husband left for 10 mins and pt was found down with food in her moth and unresponsive, medic arived with a potential of of total down time before pulses were gained. Medic gave x2 epi, with a 150bpm

## 2023-04-21 NOTE — ED Provider Notes (Signed)
[place] EMERGENCY DEPARTMENT AT [facility] Provider Note   CSN: [id number] Arrival date & time: 04-26-2023  1932     History  Chief Complaint  Patient presents with   Cardiac Arrest    Jaime Barry is a 82 y.o. female with history of aneurysm, mild to moderate dementia, presenting from home with concern for cardiac arrest.  Supplemental history provided by EMS was the patient's husband.  The patient's husband reports that the patient was in her usual state of health yesterday and this morning.  He says they were eating dinner, and he stepped out of the room for about 10 minutes, new return the patient was flat on the ground and unresponsive.  He called 911 and began CPR on scene.  Fire rescue subsequently arrived and reported patient was initially PEA arrest.  Patient had approximately 20 minutes of CPR performed the fire rescue.  EMS subsequently arrived and reported the patient initially had ROSC, subsequently went back into cardiac arrest, had CPR for 10 minutes additionally with 3 rounds of epi in total.  An Igel was placed for airway.  Patient was brought into the ED for further evaluation.  Ros reports the patient does not have a history of MI or cardiac disease.  She is not on anticoagulation.  HPI     Home Medications Prior to Admission medications   Medication Sig Start Date End Date Taking? Authorizing Provider  Calcium 600-200 MG-UNIT tablet Take 1 tablet by mouth daily.    [provider]  denosumab (PROLIA) 60 MG/ML SOLN injection Inject 60 mg into the skin every 6 (six) months. Administer in upper arm, thigh, or abdomen    [provider]  divalproex (DEPAKOTE ER) 250 MG 24 hr tablet Take 3 tablets (750 mg total) by mouth 2 (two) times daily. 04/04/23 05/04/23  Leroy Sea, MD  LORazepam (ATIVAN) 1 MG tablet Take 1 tablet (1 mg total) by mouth daily as needed for seizure. 04/04/23 04/03/24  Leroy Sea, MD  memantine (NAMENDA XR) 21 MG  CP24 24 hr capsule TAKE 1 CAPSULE(21 MG) BY MOUTH DAILY 01/31/23   Butch Penny, NP      Allergies    Patient has no known allergies.    Review of Systems   Review of Systems  Physical Exam Updated Vital Signs BP (!) 107/55   Pulse 98   Temp (!) 92.6 F (33.7 C)   Resp 13   Ht 5\' 2"  (1.575 m)   SpO2 (!) 43%   BMI 22.31 kg/m  Physical Exam Constitutional:      Comments: Obtunded  Eyes:     Conjunctiva/sclera: Conjunctivae normal.     Pupils: Pupils are equal, round, and reactive to light.  Cardiovascular:     Rate and Rhythm: Regular rhythm. Tachycardia present.  Pulmonary:     Effort: Pulmonary effort is normal. No respiratory distress.     Comments: Diminished breath sounds in the right lung field, rhonchi, bag ventilation Abdominal:     Tenderness: There is no abdominal tenderness.  Skin:    General: Skin is warm and dry.  Neurological:     Mental Status: She is alert.     Comments: GCS 3 on arrival     ED Results / Procedures / Treatments   Labs (all labs ordered are listed, but only abnormal results are displayed) Labs Reviewed  CBC WITH DIFFERENTIAL/PLATELET - Abnormal; Notable for the following components:      Result Value  WBC 15.6 (*)    RBC 3.73 (*)    MCV 114.2 (*)    MCHC 29.1 (*)    Platelets 113 (*)    Lymphs Abs 7.6 (*)    All other components within normal limits  COMPREHENSIVE METABOLIC PANEL - Abnormal; Notable for the following components:   CO2 16 (*)    Glucose, Bld 261 (*)    Creatinine, Ser 1.19 (*)    Calcium 7.9 (*)    Total Protein 5.2 (*)    Albumin 2.6 (*)    AST 461 (*)    ALT 298 (*)    GFR, Estimated 47 (*)    Anion gap 17 (*)    All other components within normal limits  URINALYSIS, ROUTINE W REFLEX MICROSCOPIC - Abnormal; Notable for the following components:   APPearance HAZY (*)    Glucose, UA 150 (*)    Hgb urine dipstick MODERATE (*)    Protein, ur 100 (*)    Bacteria, UA RARE (*)    Non Squamous Epithelial  0-5 (*)    All other components within normal limits  I-STAT CHEM 8, ED - Abnormal; Notable for the following components:   Glucose, Bld 252 (*)    Calcium, Ion 1.07 (*)    TCO2 19 (*)    All other components within normal limits  I-STAT VENOUS BLOOD GAS, ED - Abnormal; Notable for the following components:   pH, Ven 6.927 (*)    pCO2, Ven 81.1 (*)    pO2, Ven 90 (*)    Bicarbonate 16.9 (*)    TCO2 19 (*)    Acid-base deficit 17.0 (*)    Calcium, Ion 1.07 (*)    All other components within normal limits  CBG MONITORING, ED - Abnormal; Notable for the following components:   Glucose-Capillary 252 (*)    All other components within normal limits  TROPONIN I (HIGH SENSITIVITY) - Abnormal; Notable for the following components:   Troponin I (High Sensitivity) 104 (*)    All other components within normal limits  SARS CORONAVIRUS 2 BY RT PCR    EKG EKG Interpretation Date/Time:  Saturday 05/10/23 19:41:49 EDT Ventricular Rate:  105 PR Interval:  135 QRS Duration:  118 QT Interval:  335 QTC Calculation: 443 R Axis:   -55  Text Interpretation: Sinus tachycardia Incomplete right bundle branch block Low voltage, precordial leads Avr elevation, diffuse depression Confirmed by Alvester Chou [phone]) on 05-10-2023 7:44:04 PM  Radiology CT Angio Chest PE W and/or Wo Contrast  Result Date: 05-10-2023 CLINICAL DATA:  Concern for pulmonary embolism. EXAM: CT ANGIOGRAPHY CHEST WITH CONTRAST TECHNIQUE: Multidetector CT imaging of the chest was performed using the standard protocol during bolus administration of intravenous contrast. Multiplanar CT image reconstructions and MIPs were obtained to evaluate the vascular anatomy. RADIATION DOSE REDUCTION: This exam was performed according to the departmental dose-optimization program which includes automated exposure control, adjustment of the mA and/or kV according to patient size and/or use of iterative reconstruction technique. CONTRAST:  75mL  OMNIPAQUE IOHEXOL 350 MG/ML SOLN COMPARISON:  None Available. FINDINGS: Evaluation is limited due to streak artifact caused by patient's arms and support apparatus. Cardiovascular: There is no cardiomegaly or pericardial effusion. There is coronary vascular calcification and calcification of the mitral annulus. There is mild atherosclerotic calcification of the thoracic aorta. No aneurysmal dilatation or dissection. The origins of the great vessels of the aortic arch appear patent. No pulmonary artery embolus identified. Mediastinum/Nodes: No hilar  or mediastinal adenopathy. Ingested debris noted in the upper esophagus. No mediastinal fluid collection. Lungs/Pleura: There is diffuse ground-glass airspace density throughout the right upper lobe and to a lesser degree right lower lobe with areas of consolidative changes along the subpleural right upper lobe. Findings may represent pulmonary contusion/hemorrhage in the setting of trauma. Pneumonia or aspiration are not excluded. Clinical correlation is recommended. The left lung is clear. No pleural effusion or pneumothorax. Endotracheal tube with tip approximately 2 cm above the carina. There is impacted mucus in the right mainstem bronchus extending into the right upper lobe bronchus as well as mucous debris in the bronchus intermedius. There is mucous plugging in the distal right lower lobe bronchi. The central airways otherwise remain patent. Upper Abdomen: Suboptimally evaluated due to streak artifact. Musculoskeletal: Osteopenia with degenerative changes of the spine. There is a mildly displaced fracture of the anterior right fourth rib. Mildly displaced fractures of the anterior left 4th-7th ribs. Review of the MIP images confirms the above findings. IMPRESSION: 1. No CT evidence of pulmonary embolism. 2. Bilateral rib fractures.  No pneumothorax. 3. Probable pulmonary contusion predominantly involving the right upper lobe. Pneumonia or aspiration are not  excluded. 4. Impacted mucus in the right mainstem bronchus, right upper lobe bronchus, bronchus intermedius, and more distal right lower lobe bronchi. 5. Retained ingested content in the upper esophagus. 6.  Aortic Atherosclerosis (ICD10-I70.0). Electronically Signed   By: Elgie Collard M.D.   On: 04/12/2023 20:35   DG Chest Portable 1 View  Result Date: 04/24/2023 CLINICAL DATA:  Status post intubation EXAM: PORTABLE CHEST 1 VIEW COMPARISON:  08/29/2012 FINDINGS: Cardiac shadow is within normal limits. Endotracheal tube is noted 2.8 cm above the carina. Left lung is well aerated and clear. Right lung shows decreased volume with diffuse airspace opacity consistent with multifocal infiltrate. No sizable effusion is seen. No bony abnormality is noted. IMPRESSION: Right-sided multifocal infiltrate. Electronically Signed   By: Alcide Clever M.D.   On: 04/20/2023 20:35   CT Head Wo Contrast  Result Date: 04/28/2023 CLINICAL DATA:  Mental status change, unknown cause. EXAM: CT HEAD WITHOUT CONTRAST TECHNIQUE: Contiguous axial images were obtained from the base of the skull through the vertex without intravenous contrast. RADIATION DOSE REDUCTION: This exam was performed according to the departmental dose-optimization program which includes automated exposure control, adjustment of the mA and/or kV according to patient size and/or use of iterative reconstruction technique. COMPARISON:  04/01/2023. FINDINGS: Brain: No acute intracranial hemorrhage, midline shift or mass effect. No extra-axial fluid collection. Mild diffuse atrophy is noted. Periventricular white matter hypodensities are present bilaterally. No hydrocephalus. Vascular: No hyperdense vessel or unexpected calcification. Prior right ICA aneurysm clipping. Skull: No acute fracture. Craniotomy changes are noted in the frontotemporal region on the right. Sinuses/Orbits: No acute finding. Other: An endotracheal tube is noted in the oral cavity.  IMPRESSION: 1. No acute intracranial process. 2. Atrophy with chronic microvascular ischemic changes. 3. Craniotomy changes in the frontotemporal region on the right. Electronically Signed   By: Thornell Sartorius M.D.   On: 04/15/2023 20:22    Procedures .Critical Care  Performed by: Terald Sleeper, MD Authorized by: Terald Sleeper, MD   Critical care provider statement:    Critical care time (minutes):  45   Critical care time was exclusive of:  Separately billable procedures and treating other patients   Critical care was necessary to treat or prevent imminent or life-threatening deterioration of the following conditions:  Circulatory failure and  respiratory failure   Critical care was time spent personally by me on the following activities:  Ordering and performing treatments and interventions, ordering and review of laboratory studies, ordering and review of radiographic studies, pulse oximetry, review of old charts, examination of patient and evaluation of patient's response to treatment     Medications Ordered in ED Medications  0.9 %  sodium chloride infusion (250 mLs Intravenous New Bag/Given 04/18/2023 2032)  0.9 %  sodium chloride infusion (0 mLs Intravenous Hold 04/07/2023 2131)  acetaminophen (TYLENOL) tablet 650 mg (has no administration in time range)    Or  acetaminophen (TYLENOL) suppository 650 mg (has no administration in time range)  glycopyrrolate (ROBINUL) tablet 1 mg (has no administration in time range)    Or  glycopyrrolate (ROBINUL) injection 0.2 mg (has no administration in time range)    Or  glycopyrrolate (ROBINUL) injection 0.2 mg (has no administration in time range)  polyvinyl alcohol (LIQUIFILM TEARS) 1.4 % ophthalmic solution 1 drop (has no administration in time range)  fentaNYL (SUBLIMAZE) bolus via infusion 100 mcg (100 mcg Intravenous Bolus from Bag 04/02/2023 2300)  fentaNYL in NS (17mcg/ml) infusion-PREMIX (50 mcg/hr Intravenous Restarted  04/17/2023 2130)  midazolam (VERSED) injection 2 mg (2 mg Intravenous Given 04/17/2023 2301)  sodium chloride 0.9 % bolus 1,000 mL (0 mLs Intravenous Stopped 04/08/2023 2043)  cefTRIAXone (ROCEPHIN) 2 g in sodium chloride 0.9 % 100 mL IVPB (0 g Intravenous Stopped 04/29/2023 2043)  fentaNYL (SUBLIMAZE) injection 100 mcg (100 mcg Intravenous Given 04/04/2023 2013)  iohexol (OMNIPAQUE) 350 MG/ML injection 75 mL (75 mLs Intravenous Contrast Given 04/11/2023 2014)    ED Course/ Medical Decision Making/ A&P Clinical Course as of 04/03/2023 2354  Sat Apr 21, 2023  1955 I spoke to Dr End from cardiology regarding avr elevation, defuse depressions - he recommends continued medical workup, this still does not appear consistent with major MI as cause of syncope, and no immediate plan for catheterization.  If ECG changes can contact caridology again [MT]  2012 Husband updated [MT]  2046 Paged critical care [MT]  2058 Spoke to crit care, to see patient [MT]  2310 Following plan for admission, the ICU attending I had a goals of care discussion with the patient's husband, and collectively they have agreed to pursue comfort care measures, given the extremely poor prognosis and likelihood of either anoxic brain injury or impending death in the hospital.  The patient was subsequently extubated.  She remains on fentanyl infusion, and was given Versed for comfort as needed.  At this time her heart rate is 110, oxygen level is 45-50% spo2, BP remains soft but stable.  She will be admitted to the hospital on continue comfort care measures.  The patient's husband was present at the bedside [MT]    Clinical Course User Index [MT] Cutberto Winfree, Kermit Balo, MD                             Medical Decision Making Amount and/or Complexity of Data Reviewed Labs: ordered. Radiology: ordered. ECG/medicine tests: ordered.  Risk Prescription drug management. Decision regarding hospitalization.   This patient presents to the ED with concern  for cardiac arrest. This involves an extensive number of treatment options, and is a complaint that carries with it a high risk of complications and morbidity.  The differential diagnosis includes PE versus brain aneurysm versus arrhythmia versus ACS versus other  Co-morbidities that complicate the patient  evaluation: History of dementia, history of brain aneurysm  Additional history obtained from EMS and the patient's husband  I ordered and personally interpreted labs.  The pertinent results include: Hypercarbia, acidosis, elevated troponin, white blood cell count elevated  I ordered imaging studies including chest, CT of the head and CT PE study I independently visualized and interpreted imaging which showed right lobe aspiration, right lobe mucous plugging I agree with the radiologist interpretation  The patient was maintained on a cardiac monitor.  I personally viewed and interpreted the cardiac monitored which showed an underlying rhythm of: Tachycardia  Per my interpretation the patient's ECG shows sinus tachycardia, aVR elevation with diffuse depression,  I ordered medication including but Endoxcin initially for aspiration.  Fluid bolus given in the ED.  Subsequently patient started on fentanyl infusion with Versed push as needed for sedation  I requested consultation with the critical care,  and discussed lab and imaging findings as well as pertinent plan - they recommend: Goals of care discussion with the patient's husband decision was made to move towards comfort care and terminal extubation.  This was felt to be in keeping with the patient's wishes.    Dispostion:  After consideration of the diagnostic results and the patients response to treatment.  Patient is currently extubated, maintaining hypoxia saturation, on full comfort care measures, plan to likely admit to the hospital to continue comfort care overnight.  Awaiting call back from hospitalist - signed out to Dr Manus Gunning EDP           Final Clinical Impression(s) / ED Diagnoses Final diagnoses:  Cardiac arrest [facility]    Rx / DC Orders ED Discharge Orders     None         Syerra Abdelrahman, Kermit Balo, MD 04/20/2023 2356

## 2023-04-21 NOTE — ED Provider Notes (Signed)
Procedure Name: Intubation Date/Time: May 18, 2023 7:48 PM  Performed by: Fayrene Helper, MDPre-anesthesia Checklist: Suction available, Emergency Drugs available, Patient identified and Patient being monitored Preoxygenation: Pre-oxygenation with 100% oxygen Ventilation: Oral airway inserted - appropriate to patient size Laryngoscope Size: Mac and 3 Grade View: Grade I Tube type: Non-subglottic suction tube Tube size: 7.5 mm Number of attempts: 1 Airway Equipment and Method: Video-laryngoscopy Placement Confirmation: ETT inserted through vocal cords under direct vision, Positive ETCO2 and CO2 detector Secured at: 23 cm Tube secured with: ETT holder Dental Injury: Teeth and Oropharynx as per pre-operative assessment         Fayrene Helper, MD 05/18/2023 1950    Terald Sleeper, MD 05/18/23 2321

## 2023-04-21 NOTE — Progress Notes (Signed)
Patient transported on ventilator to CT and back with no complications. Vitals stable.  ?

## 2023-04-21 NOTE — ED Notes (Signed)
Pt was extubated with RT

## 2023-04-21 NOTE — Progress Notes (Signed)
   24-Apr-2023 2050  Spiritual Encounters  Type of Visit Attempt (pt unavailable)  Care provided to: [facility] partners present during encounter Nurse;Physician  Reason for visit Trauma  OnCall Visit Yes   Responded to trauma level 1 patient brought in by EMT found with unconscious by spouse. Waited in consult room with spouse until doctors came with report. Patient admitted.

## 2023-04-21 NOTE — Progress Notes (Signed)
Patient extubated to comfort measures at this time. RN at bedside.

## 2023-04-21 NOTE — Code Documentation (Signed)
Coming in with lucas CPR going. Medic lost pulses on arrival

## 2023-04-22 DIAGNOSIS — Z7189 Other specified counseling: Secondary | ICD-10-CM

## 2023-04-22 DIAGNOSIS — Z87891 Personal history of nicotine dependence: Secondary | ICD-10-CM | POA: Diagnosis not present

## 2023-04-22 DIAGNOSIS — G309 Alzheimer's disease, unspecified: Secondary | ICD-10-CM

## 2023-04-22 DIAGNOSIS — F02A0 Dementia in other diseases classified elsewhere, mild, without behavioral disturbance, psychotic disturbance, mood disturbance, and anxiety: Secondary | ICD-10-CM | POA: Diagnosis present

## 2023-04-22 DIAGNOSIS — Z515 Encounter for palliative care: Secondary | ICD-10-CM

## 2023-04-22 DIAGNOSIS — Z8679 Personal history of other diseases of the circulatory system: Secondary | ICD-10-CM | POA: Diagnosis not present

## 2023-04-22 DIAGNOSIS — I469 Cardiac arrest, cause unspecified: Secondary | ICD-10-CM

## 2023-04-22 DIAGNOSIS — G40309 Generalized idiopathic epilepsy and epileptic syndromes, not intractable, without status epilepticus: Secondary | ICD-10-CM | POA: Diagnosis not present

## 2023-04-22 DIAGNOSIS — F0280 Dementia in other diseases classified elsewhere without behavioral disturbance: Secondary | ICD-10-CM

## 2023-04-22 DIAGNOSIS — Z79899 Other long term (current) drug therapy: Secondary | ICD-10-CM | POA: Diagnosis not present

## 2023-04-22 DIAGNOSIS — G40409 Other generalized epilepsy and epileptic syndromes, not intractable, without status epilepticus: Secondary | ICD-10-CM | POA: Diagnosis present

## 2023-04-22 DIAGNOSIS — E872 Acidosis, unspecified: Secondary | ICD-10-CM | POA: Diagnosis present

## 2023-04-22 DIAGNOSIS — M810 Age-related osteoporosis without current pathological fracture: Secondary | ICD-10-CM | POA: Diagnosis present

## 2023-04-22 DIAGNOSIS — Z820 Family history of epilepsy and other diseases of the nervous system: Secondary | ICD-10-CM | POA: Diagnosis not present

## 2023-04-22 DIAGNOSIS — Z66 Do not resuscitate: Secondary | ICD-10-CM | POA: Diagnosis present

## 2023-04-22 DIAGNOSIS — J690 Pneumonitis due to inhalation of food and vomit: Secondary | ICD-10-CM | POA: Diagnosis present

## 2023-04-22 MED ORDER — GLYCOPYRROLATE 0.2 MG/ML IJ SOLN: 0.2000 mg | INTRAMUSCULAR | Status: DC | PRN

## 2023-04-22 MED ORDER — SODIUM CHLORIDE 0.9 % IV SOLN: 12.5000 mg | Freq: Four times a day (QID) | INTRAVENOUS | Status: DC | PRN

## 2023-04-22 MED ORDER — HALOPERIDOL 0.5 MG PO TABS: 0.5000 mg | ORAL_TABLET | ORAL | Status: DC | PRN

## 2023-04-22 MED ORDER — POLYVINYL ALCOHOL 1.4 % OP SOLN: 1.0000 [drp] | Freq: Four times a day (QID) | OPHTHALMIC | Status: DC | PRN

## 2023-04-22 MED ORDER — FLEET ENEMA 7-19 GM/118ML RE ENEM: 1.0000 | ENEMA | Freq: Every day | RECTAL | Status: DC | PRN

## 2023-04-22 MED ORDER — LORAZEPAM 2 MG/ML IJ SOLN: 1.0000 mg | INTRAMUSCULAR | Status: DC | PRN

## 2023-04-22 MED ORDER — ACETAMINOPHEN 325 MG PO TABS: 650.0000 mg | ORAL_TABLET | Freq: Four times a day (QID) | ORAL | Status: DC | PRN

## 2023-04-22 MED ORDER — LORAZEPAM 2 MG/ML PO CONC: 1.0000 mg | ORAL | Status: DC | PRN

## 2023-04-22 MED ORDER — DIPHENHYDRAMINE HCL 50 MG/ML IJ SOLN: 12.5000 mg | INTRAMUSCULAR | Status: DC | PRN

## 2023-04-22 MED ORDER — GLYCOPYRROLATE 1 MG PO TABS: 1.0000 mg | ORAL_TABLET | ORAL | Status: DC | PRN

## 2023-04-22 MED ORDER — ONDANSETRON HCL 4 MG/2ML IJ SOLN: 4.0000 mg | Freq: Four times a day (QID) | INTRAMUSCULAR | Status: DC | PRN

## 2023-04-22 MED ORDER — LORAZEPAM 1 MG PO TABS: 1.0000 mg | ORAL_TABLET | ORAL | Status: DC | PRN

## 2023-04-22 MED ORDER — HALOPERIDOL LACTATE 5 MG/ML IJ SOLN: 0.5000 mg | INTRAMUSCULAR | Status: DC | PRN

## 2023-04-22 MED ORDER — ONDANSETRON 4 MG PO TBDP: 4.0000 mg | ORAL_TABLET | Freq: Four times a day (QID) | ORAL | Status: DC | PRN

## 2023-04-22 MED ORDER — HALOPERIDOL LACTATE 2 MG/ML PO CONC: 0.5000 mg | ORAL | Status: DC | PRN

## 2023-04-22 MED ORDER — NYSTATIN 100000 UNIT/GM EX POWD: Freq: Three times a day (TID) | CUTANEOUS | Status: DC | PRN

## 2023-04-22 MED ORDER — MORPHINE SULFATE (PF) 2 MG/ML IV SOLN: 1.0000 mg | INTRAVENOUS | Status: DC | PRN

## 2023-04-22 MED ORDER — ACETAMINOPHEN 650 MG RE SUPP: 650.0000 mg | Freq: Four times a day (QID) | RECTAL | Status: DC | PRN

## 2023-04-22 MED ORDER — BIOTENE DRY MOUTH MT LIQD: 15.0000 mL | OROMUCOSAL | Status: DC | PRN

## 2023-04-22 NOTE — Progress Notes (Signed)
I have seen and assessed patient and agree with Dr. [place] Service assessment and plan. Patient is a 82 year old female history of dementia, seizures presented to the ED with unwitnessed cardiac arrest.  Patient noted to be found unresponsive while eating her dinner by her husband who had been away for 10 minutes and found her on the ground with food in her mouth and unresponsive.  When EMS arrived patient was in PEA arrest and required 40 minutes of CPR/ACLS before ROSC was obtained.  Patient initially intubated.  Seen by PCCM, discussed with patient's husband and felt patient had a prolonged cardiac arrest due to aspiration from suspected seizure.  Per PCCM no realistic expectation of meaningful neurological recovery and decision made to transition to full comfort measures.  Palliative care consulted.  Place end-of-life order set.  Likely in-hospital death versus residential hospice home.  No charge

## 2023-04-22 NOTE — H&P (Signed)
History and Physical    Patient: Jaime Barry:[id number] DOB: [birth year] DOA: 04/15/2023 DOS: the patient was seen and examined on 04/22/2023 PCP: Gweneth Dimitri, MD  Patient coming from: Home  Chief Complaint:  Chief Complaint  Patient presents with   Cardiac Arrest   HPI: Jaime Barry is a 82 y.o. female with medical history significant of dementia, seizures.  Pt in to ED this evening following unwitnessed cardiac arrest: She reportedly was found unresponsive by her husband while eating her dinner. He had been away from her for ~10 minutes when he found her on the ground with food in her mouth, unresponsive. When EMS arrived, she was in PEA arrest and required ~40 minutes of CPR/ACLS before ROSC was obtained.   Received 40 min of CPR prior to ROSC.  Recent admission for seizure in setting of subtherapeutic Depakote level.   History of progressive dementia, but had been able to stay alone during day.   Intubated in ED.  Started on fentanyl for rib fractures.  Ultimately decision made by husband in discussion with Intensivist to de-escalate care and proceed to comfort measures, as per Dr. Birdena Jubilee note: "Prolonged cardiac arrest due to aspiration from suspected seizure in frail patient with dementia.  No realistic expectation of meaningful neurological recovery. I  believe additional observation period would only delay coming to same conclusion.  I have reviewed the patient's advanced directive and the patient does indeed have and uncurable condition for which transition to comfort care is the most appropriate course of action.  The patient's husband is ready to withdraw at this time and there is no additional family to visit at present.  Continue fentanyl for comfort. "  Pt extubated, hospitalist asked to admit for end of life care.  Review of Systems: As mentioned in the history of present illness. All other systems reviewed and are negative. Past Medical History:  Diagnosis  Date   Altered mental state 08/29/2012   Dementia (HCC)    Dementia (HCC)    progressive    H/O cerebral aneurysm repair    in December of 2011   Lower leg edema    Osteoporosis    Seizure (HCC)    Seizures (HCC) 08/29/2012   Spinal stenosis    Past Surgical History:  Procedure Laterality Date   artery aneurysm clipping Right    CEREBRAL ANEURYSM REPAIR  09/2010   paraophthalmic artery /note 08/29/2012   LUMBAR PUNCTURE  08/29/2012   TONSILLECTOMY     Social History:  reports that she has quit smoking. She has never used smokeless tobacco. She reports that she does not drink alcohol and does not use drugs.  No Known Allergies  Family History  Problem Relation Age of Onset   Alzheimer's disease Mother    Leukemia Father        Died in his early 59s    Prior to Admission medications   Medication Sig Start Date End Date Taking? Authorizing Provider  Calcium 600-200 MG-UNIT tablet Take 1 tablet by mouth daily.    [provider]  denosumab (PROLIA) 60 MG/ML SOLN injection Inject 60 mg into the skin every 6 (six) months. Administer in upper arm, thigh, or abdomen    [provider]  divalproex (DEPAKOTE ER) 250 MG 24 hr tablet Take 3 tablets (750 mg total) by mouth 2 (two) times daily. 04/04/23 05/04/23  Leroy Sea, MD  LORazepam (ATIVAN) 1 MG tablet Take 1 tablet (1 mg total) by mouth daily  as needed for seizure. 04/04/23 04/03/24  Leroy Sea, MD  memantine (NAMENDA XR) 21 MG CP24 24 hr capsule TAKE 1 CAPSULE(21 MG) BY MOUTH DAILY 01/31/23   Butch Penny, NP    Physical Exam: Vitals:   04/19/2023 2330 04/02/2023 2345 04/22/23 0000 04/22/23 0015  BP:      Pulse: (!) 102 (!) 101 (!) 101 (!) 101  Resp: 12 14 13 13   Temp: (!) 92.7 F (33.7 C) (!) 92.7 F (33.7 C) (!) 92.8 F (33.8 C) (!) 92.9 F (33.8 C)  SpO2: (!) 52% (!) 55% (!) 57% (!) 59%  Height:       Constitutional: ill appearing with some gasping respirations. Eyes: Open, fixed pupils, not  responsive Respiratory: clear to auscultation bilaterally, no wheezing, no crackles. Normal respiratory effort. No accessory muscle use.  Cardiovascular: Mild tachycardia Abdomen: no tenderness, no masses palpated. No hepatosplenomegaly. Bowel sounds positive.  Musculoskeletal: no clubbing / cyanosis. No joint deformity upper and lower extremities. Good ROM, no contractures. Normal muscle tone.  Neurologic: Gasping respirations, No response to noxious stimuli, no cough  Data Reviewed:    Labs on Admission: I have personally reviewed following labs and imaging studies  CBC: Recent Labs  Lab 04/07/2023 1939 04/22/2023 1958  WBC 15.6*  --   NEUTROABS 7.6  --   HGB 12.4 13.3  12.9  HCT 42.6 39.0  38.0  MCV 114.2*  --   PLT 113*  --    Basic Metabolic Panel: Recent Labs  Lab 04/20/2023 1939 04/20/2023 1958  NA 138 139  140  K 4.2 4.1  4.1  CL 105 106  CO2 16*  --   GLUCOSE 261* 252*  BUN 17 20  CREATININE 1.19* 1.00  CALCIUM 7.9*  --    GFR: CrCl cannot be calculated (Unknown ideal weight.). Liver Function Tests: Recent Labs  Lab 04/24/2023 1939  AST 461*  ALT 298*  ALKPHOS 53  BILITOT 0.6  PROT 5.2*  ALBUMIN 2.6*   No results for input(s): "LIPASE", "AMYLASE" in the last 168 hours. No results for input(s): "AMMONIA" in the last 168 hours. Coagulation Profile: No results for input(s): "INR", "PROTIME" in the last 168 hours. Cardiac Enzymes: No results for input(s): "CKTOTAL", "CKMB", "CKMBINDEX", "TROPONINI" in the last 168 hours. BNP (last 3 results) No results for input(s): "PROBNP" in the last 8760 hours. HbA1C: No results for input(s): "HGBA1C" in the last 72 hours. CBG: Recent Labs  Lab 04/11/2023 1944  GLUCAP 252*   Lipid Profile: No results for input(s): "CHOL", "HDL", "LDLCALC", "TRIG", "CHOLHDL", "LDLDIRECT" in the last 72 hours. Thyroid Function Tests: No results for input(s): "TSH", "T4TOTAL", "FREET4", "T3FREE", "THYROIDAB" in the last 72  hours. Anemia Panel: No results for input(s): "VITAMINB12", "FOLATE", "FERRITIN", "TIBC", "IRON", "RETICCTPCT" in the last 72 hours. Urine analysis:    Component Value Date/Time   COLORURINE YELLOW 04/29/2023 2025   APPEARANCEUR HAZY (A) 04/07/2023 2025   LABSPEC 1.024 04/15/2023 2025   PHURINE 6.0 05/02/2023 2025   GLUCOSEU 150 (A) 04/09/2023 2025   HGBUR MODERATE (A) 04/19/2023 2025   BILIRUBINUR NEGATIVE 04/10/2023 2025   KETONESUR NEGATIVE 04/04/2023 2025   PROTEINUR 100 (A) 04/28/2023 2025   UROBILINOGEN 0.2 08/29/2012 1402   NITRITE NEGATIVE 04/27/2023 2025   LEUKOCYTESUR NEGATIVE 04/27/2023 2025    Radiological Exams on Admission: CT Angio Chest PE W and/or Wo Contrast  Result Date: 04/27/2023 CLINICAL DATA:  Concern for pulmonary embolism. EXAM: CT ANGIOGRAPHY CHEST WITH CONTRAST TECHNIQUE: Multidetector  CT imaging of the chest was performed using the standard protocol during bolus administration of intravenous contrast. Multiplanar CT image reconstructions and MIPs were obtained to evaluate the vascular anatomy. RADIATION DOSE REDUCTION: This exam was performed according to the departmental dose-optimization program which includes automated exposure control, adjustment of the mA and/or kV according to patient size and/or use of iterative reconstruction technique. CONTRAST:  75mL OMNIPAQUE IOHEXOL 350 MG/ML SOLN COMPARISON:  None Available. FINDINGS: Evaluation is limited due to streak artifact caused by patient's arms and support apparatus. Cardiovascular: There is no cardiomegaly or pericardial effusion. There is coronary vascular calcification and calcification of the mitral annulus. There is mild atherosclerotic calcification of the thoracic aorta. No aneurysmal dilatation or dissection. The origins of the great vessels of the aortic arch appear patent. No pulmonary artery embolus identified. Mediastinum/Nodes: No hilar or mediastinal adenopathy. Ingested debris noted in the upper  esophagus. No mediastinal fluid collection. Lungs/Pleura: There is diffuse ground-glass airspace density throughout the right upper lobe and to a lesser degree right lower lobe with areas of consolidative changes along the subpleural right upper lobe. Findings may represent pulmonary contusion/hemorrhage in the setting of trauma. Pneumonia or aspiration are not excluded. Clinical correlation is recommended. The left lung is clear. No pleural effusion or pneumothorax. Endotracheal tube with tip approximately 2 cm above the carina. There is impacted mucus in the right mainstem bronchus extending into the right upper lobe bronchus as well as mucous debris in the bronchus intermedius. There is mucous plugging in the distal right lower lobe bronchi. The central airways otherwise remain patent. Upper Abdomen: Suboptimally evaluated due to streak artifact. Musculoskeletal: Osteopenia with degenerative changes of the spine. There is a mildly displaced fracture of the anterior right fourth rib. Mildly displaced fractures of the anterior left 4th-7th ribs. Review of the MIP images confirms the above findings. IMPRESSION: 1. No CT evidence of pulmonary embolism. 2. Bilateral rib fractures.  No pneumothorax. 3. Probable pulmonary contusion predominantly involving the right upper lobe. Pneumonia or aspiration are not excluded. 4. Impacted mucus in the right mainstem bronchus, right upper lobe bronchus, bronchus intermedius, and more distal right lower lobe bronchi. 5. Retained ingested content in the upper esophagus. 6.  Aortic Atherosclerosis (ICD10-I70.0). Electronically Signed   By: Elgie Collard M.D.   On: May 21, 2023 20:35   DG Chest Portable 1 View  Result Date: 21-May-2023 CLINICAL DATA:  Status post intubation EXAM: PORTABLE CHEST 1 VIEW COMPARISON:  08/29/2012 FINDINGS: Cardiac shadow is within normal limits. Endotracheal tube is noted 2.8 cm above the carina. Left lung is well aerated and clear. Right lung shows  decreased volume with diffuse airspace opacity consistent with multifocal infiltrate. No sizable effusion is seen. No bony abnormality is noted. IMPRESSION: Right-sided multifocal infiltrate. Electronically Signed   By: Alcide Clever M.D.   On: 05-21-2023 20:35   CT Head Wo Contrast  Result Date: 21-May-2023 CLINICAL DATA:  Mental status change, unknown cause. EXAM: CT HEAD WITHOUT CONTRAST TECHNIQUE: Contiguous axial images were obtained from the base of the skull through the vertex without intravenous contrast. RADIATION DOSE REDUCTION: This exam was performed according to the departmental dose-optimization program which includes automated exposure control, adjustment of the mA and/or kV according to patient size and/or use of iterative reconstruction technique. COMPARISON:  04/01/2023. FINDINGS: Brain: No acute intracranial hemorrhage, midline shift or mass effect. No extra-axial fluid collection. Mild diffuse atrophy is noted. Periventricular white matter hypodensities are present bilaterally. No hydrocephalus. Vascular: No hyperdense vessel or unexpected  calcification. Prior right ICA aneurysm clipping. Skull: No acute fracture. Craniotomy changes are noted in the frontotemporal region on the right. Sinuses/Orbits: No acute finding. Other: An endotracheal tube is noted in the oral cavity. IMPRESSION: 1. No acute intracranial process. 2. Atrophy with chronic microvascular ischemic changes. 3. Craniotomy changes in the frontotemporal region on the right. Electronically Signed   By: Thornell Sartorius M.D.   On: 05-18-2023 20:22    EKG: Independently reviewed.   Assessment and Plan: * Admission for end of life care Pt in to ED today following unwitnessed cardiac arrest (? Due to seizure).  40 min CPR until ROSC. After intensivist discussion with husband: pt extubated and decision made by husband to proceed to comfort measures only. Pt at bedside, he confirms goals of care are to keep patient comfortable as  we wait for her to pass on. Admitting pt for end of life care / comfort care Comfort care pathway Fentanyl gtt, bolus PRN Versed PRN No further lab draws or tests at this point in time. Pal care consult put in for tomorrow AM for hospice placement, if she makes it that long. More likely, anticipate in-hospital death.  Generalized convulsive epilepsy (HCC) H/o seizure disorder with seizure admit at end of last month. PRN versed ordered and available for seizures if needed.  Alzheimer's disease (HCC) At baseline pt has advancing alzheimers dementia.      Advance Care Planning:   Code Status: DNR Goal of care is patients comfort as she passes on, this is confirmed with husband who is at bedside  Consults: Pal care consult in epic for AM.  Family Communication: Husband at bedside  Severity of Illness: The appropriate patient status for this patient is INPATIENT. Inpatient status is judged to be reasonable and necessary in order to provide the required intensity of service to ensure the patient's safety. The patient's presenting symptoms, physical exam findings, and initial radiographic and laboratory data in the context of their chronic comorbidities is felt to place them at high risk for further clinical deterioration. Furthermore, it is not anticipated that the patient will be medically stable for discharge from the hospital within 2 midnights of admission.   * I certify that at the point of admission it is my clinical judgment that the patient will require inpatient hospital care spanning beyond 2 midnights from the point of admission due to high intensity of service, high risk for further deterioration and high frequency of surveillance required.*  Author: Hillary Bow., DO 04/22/2023 12:35 AM  For on call review [email or web address].

## 2023-04-22 NOTE — Assessment & Plan Note (Signed)
H/o seizure disorder with seizure admit at end of last month. PRN versed ordered and available for seizures if needed. Patient was transition to full comfort measures.

## 2023-04-22 NOTE — Progress Notes (Incomplete)
PROGRESS NOTE    Jaime Barry  UUV:[id number] DOB: 01-16-1941 DOA: 04/11/2023 PCP: Gweneth Dimitri, MD (Confirm with patient/family/NH records and if not entered, this HAS to be entered at [facility] point of entry. "No PCP" if truly none.)   Chief Complaint  Patient presents with   Cardiac Arrest    Brief Narrative: (Start on day 1 of progress note - keep it brief and live) ***   Assessment & Plan:   Principal Problem:   Admission for end of life care Active Problems:   Cardiac arrest (HCC)   Generalized convulsive epilepsy (HCC)   Alzheimer's disease (HCC)   ***   DVT prophylaxis: (Lovenox/Heparin/SCD's/anticoagulated/None (if comfort care) Code Status: (Full/Partial - specify details) Family Communication: (Specify name, relationship & date discussed. NO "discussed with patient") Disposition:   Status is: Inpatient {Inpatient:23812}   Consultants:  ***  Procedures: (Don't include imaging studies which can be auto populated. Include things that cannot be auto populated i.e. Echo, Carotid and venous dopplers, Foley, Bipap, HD, tubes/drains, wound vac, central lines etc) ***  Antimicrobials: (specify start and planned stop date. Auto populated tables are space occupying and do not give end dates) ***    Subjective: Patient unresponsive.  Some agonal breathing.  Husband at bedside.  Objective: Vitals:   04/02/2023 2330 04/18/2023 2345 04/22/23 0000 04/22/23 0015  BP:      Pulse: (!) 102 (!) 101 (!) 101 (!) 101  Resp: 12 14 13 13   Temp: (!) 92.7 F (33.7 C) (!) 92.7 F (33.7 C) (!) 92.8 F (33.8 C) (!) 92.9 F (33.8 C)  SpO2: (!) 52% (!) 55% (!) 57% (!) 59%  Height:       No intake or output data in the 24 hours ending 04/22/23 1059 There were no vitals filed for this visit.  Examination:  General exam: Appears calm and comfortable  Respiratory system: Clear to auscultation. Respiratory effort normal. Cardiovascular system: S1 & S2 heard, RRR. No JVD,  murmurs, rubs, gallops or clicks. No pedal edema. Gastrointestinal system: Abdomen is nondistended, soft and nontender. No organomegaly or masses felt. Normal bowel sounds heard. Central nervous system: Alert and oriented. No focal neurological deficits. Extremities: Symmetric 5 x 5 power. Skin: No rashes, lesions or ulcers Psychiatry: Judgement and insight appear normal. Mood & affect appropriate.     Data Reviewed: I have personally reviewed following labs and imaging studies  CBC: Recent Labs  Lab 04/20/2023 1939 04/06/2023 1958  WBC 15.6*  --   NEUTROABS 7.6  --   HGB 12.4 13.3  12.9  HCT 42.6 39.0  38.0  MCV 114.2*  --   PLT 113*  --     Basic Metabolic Panel: Recent Labs  Lab 04/27/2023 1939 04/24/2023 1958  NA 138 139  140  K 4.2 4.1  4.1  CL 105 106  CO2 16*  --   GLUCOSE 261* 252*  BUN 17 20  CREATININE 1.19* 1.00  CALCIUM 7.9*  --     GFR: CrCl cannot be calculated (Unknown ideal weight.).  Liver Function Tests: Recent Labs  Lab 04/10/2023 1939  AST 461*  ALT 298*  ALKPHOS 53  BILITOT 0.6  PROT 5.2*  ALBUMIN 2.6*    CBG: Recent Labs  Lab 05/02/2023 1944  GLUCAP 252*     No results found for this or any previous visit (from the past 240 hour(s)).       Radiology Studies: CT Angio Chest PE W and/or Wo Contrast  Result  Date: 04/10/2023 CLINICAL DATA:  Concern for pulmonary embolism. EXAM: CT ANGIOGRAPHY CHEST WITH CONTRAST TECHNIQUE: Multidetector CT imaging of the chest was performed using the standard protocol during bolus administration of intravenous contrast. Multiplanar CT image reconstructions and MIPs were obtained to evaluate the vascular anatomy. RADIATION DOSE REDUCTION: This exam was performed according to the departmental dose-optimization program which includes automated exposure control, adjustment of the mA and/or kV according to patient size and/or use of iterative reconstruction technique. CONTRAST:  75mL OMNIPAQUE IOHEXOL 350  MG/ML SOLN COMPARISON:  None Available. FINDINGS: Evaluation is limited due to streak artifact caused by patient's arms and support apparatus. Cardiovascular: There is no cardiomegaly or pericardial effusion. There is coronary vascular calcification and calcification of the mitral annulus. There is mild atherosclerotic calcification of the thoracic aorta. No aneurysmal dilatation or dissection. The origins of the great vessels of the aortic arch appear patent. No pulmonary artery embolus identified. Mediastinum/Nodes: No hilar or mediastinal adenopathy. Ingested debris noted in the upper esophagus. No mediastinal fluid collection. Lungs/Pleura: There is diffuse ground-glass airspace density throughout the right upper lobe and to a lesser degree right lower lobe with areas of consolidative changes along the subpleural right upper lobe. Findings may represent pulmonary contusion/hemorrhage in the setting of trauma. Pneumonia or aspiration are not excluded. Clinical correlation is recommended. The left lung is clear. No pleural effusion or pneumothorax. Endotracheal tube with tip approximately 2 cm above the carina. There is impacted mucus in the right mainstem bronchus extending into the right upper lobe bronchus as well as mucous debris in the bronchus intermedius. There is mucous plugging in the distal right lower lobe bronchi. The central airways otherwise remain patent. Upper Abdomen: Suboptimally evaluated due to streak artifact. Musculoskeletal: Osteopenia with degenerative changes of the spine. There is a mildly displaced fracture of the anterior right fourth rib. Mildly displaced fractures of the anterior left 4th-7th ribs. Review of the MIP images confirms the above findings. IMPRESSION: 1. No CT evidence of pulmonary embolism. 2. Bilateral rib fractures.  No pneumothorax. 3. Probable pulmonary contusion predominantly involving the right upper lobe. Pneumonia or aspiration are not excluded. 4. Impacted mucus  in the right mainstem bronchus, right upper lobe bronchus, bronchus intermedius, and more distal right lower lobe bronchi. 5. Retained ingested content in the upper esophagus. 6.  Aortic Atherosclerosis (ICD10-I70.0). Electronically Signed   By: Elgie Collard M.D.   On: 04/20/2023 20:35   DG Chest Portable 1 View  Result Date: 04/04/2023 CLINICAL DATA:  Status post intubation EXAM: PORTABLE CHEST 1 VIEW COMPARISON:  08/29/2012 FINDINGS: Cardiac shadow is within normal limits. Endotracheal tube is noted 2.8 cm above the carina. Left lung is well aerated and clear. Right lung shows decreased volume with diffuse airspace opacity consistent with multifocal infiltrate. No sizable effusion is seen. No bony abnormality is noted. IMPRESSION: Right-sided multifocal infiltrate. Electronically Signed   By: Alcide Clever M.D.   On: 04/25/2023 20:35   CT Head Wo Contrast  Result Date: 04/03/2023 CLINICAL DATA:  Mental status change, unknown cause. EXAM: CT HEAD WITHOUT CONTRAST TECHNIQUE: Contiguous axial images were obtained from the base of the skull through the vertex without intravenous contrast. RADIATION DOSE REDUCTION: This exam was performed according to the departmental dose-optimization program which includes automated exposure control, adjustment of the mA and/or kV according to patient size and/or use of iterative reconstruction technique. COMPARISON:  04/01/2023. FINDINGS: Brain: No acute intracranial hemorrhage, midline shift or mass effect. No extra-axial fluid collection. Mild diffuse atrophy  is noted. Periventricular white matter hypodensities are present bilaterally. No hydrocephalus. Vascular: No hyperdense vessel or unexpected calcification. Prior right ICA aneurysm clipping. Skull: No acute fracture. Craniotomy changes are noted in the frontotemporal region on the right. Sinuses/Orbits: No acute finding. Other: An endotracheal tube is noted in the oral cavity. IMPRESSION: 1. No acute intracranial  process. 2. Atrophy with chronic microvascular ischemic changes. 3. Craniotomy changes in the frontotemporal region on the right. Electronically Signed   By: Thornell Sartorius M.D.   On: 05/21/23 20:22        Scheduled Meds: Continuous Infusions:  sodium chloride 250 mL (21-May-2023 2032)   sodium chloride Stopped (May 21, 2023 2131)   chlorproMAZINE (THORAZINE) 12.5 mg in sodium chloride 0.9 % 25 mL IVPB     fentaNYL infusion INTRAVENOUS 50 mcg/hr (2023-05-21 2130)     LOS: 0 days    Time spent: ***    Ramiro Harvest, MD [facility]   To contact the attending provider between 7A-7P or the covering provider during after hours 7P-7A, please log into the web site [email or web address] and access using universal [place] password for that web site. If you do not have the password, please call the hospital operator.  04/22/2023, 10:59 AM

## 2023-04-22 NOTE — Assessment & Plan Note (Signed)
Pt in to ED today following unwitnessed cardiac arrest (? Due to seizure).  40 min CPR until ROSC. After intensivist discussion with husband: pt extubated and decision made by husband to proceed to comfort measures only. Pt at bedside, he confirms goals of care are to keep patient comfortable as we wait for her to pass on. Admitting pt for end of life care / comfort care Comfort care pathway Fentanyl gtt, bolus PRN Versed PRN No further lab draws or tests at this point in time. Pal care consult put in for tomorrow AM for hospice placement, if she makes it that long. More likely, anticipate in-hospital death.

## 2023-04-22 NOTE — Consult Note (Signed)
Palliative Care Consult Note                                  Date: 04/22/2023   Patient Name: Jaime Barry  DOB: 02-Apr-1941  MRN: [id number]  Age / Sex: 82 y.o., female  PCP: Gweneth Dimitri, MD Referring Physician: Rodolph Bong, MD  Reason for Consultation: Comfort care  HPI/Patient Profile: 82 y.o. female  with past medical history of dementia and seizures admitted on 04/16/2023  by EMS with prolonged cardiac arrest with approximately 40 minutes of CPR/ACLS before ROSC.   In the ED the patient's husband Jaime Barry made the decision to liberate her from the ventilator and proceed with measures to keep her comfortable.  Past Medical History:  Diagnosis Date   Altered mental state 08/29/2012   Dementia (HCC)    Dementia (HCC)    progressive    H/O cerebral aneurysm repair    in December of 2011   Lower leg edema    Osteoporosis    Seizure (HCC)    Seizures (HCC) 08/29/2012   Spinal stenosis     Subjective:   I have reviewed medical records including EPIC notes, labs and imaging, received an update from the bedside RN, assessed the patient and then called the patient's husband to discuss EOL wishes, disposition and options.  I introduced Palliative Medicine as specialized medical care for people living with serious illness. It focuses on providing relief from symptoms and stress of a serious illness. The goal is to improve quality of life for both the patient and the family.   Today's Discussion: The patient's husband understands the seriousness of his wife's condition. He wants to provide her with the best quality of life in her remaining time whether that is in the hospital or residential hospice.  We discussed what comfort measures would look like for this patient. The patient would no longer receive aggressive medical interventions such as continuous vital signs, lab work, radiology testing, or medications not focused on  comfort. All care would focus on how the patient is looking and feeling. This would include management of any symptoms that may cause discomfort, pain, shortness of breath, cough, nausea, agitation, anxiety, and/or secretions etc. Symptoms would be managed with medications and other non-pharmacological interventions such as spiritual support if requested, repositioning, or therapeutic listening. The patient's husband verbalized understanding and appreciation.    Questions and concerns were addressed. "Gone from my sight" booklet left for review. The family was encouraged to call with questions or concerns. PMT will continue to support holistically.    Review of Systems  Unable to perform ROS   Objective:   Primary Diagnoses: Present on Admission:  Alzheimer's disease (HCC)  Generalized convulsive epilepsy (HCC)  Cardiac arrest [facility])   Physical Exam Constitutional:      Appearance: She is ill-appearing.  Pulmonary:     Effort: Pulmonary effort is normal.  Skin:    General: Skin is cool.  Neurological:     Mental Status: She is unresponsive.     Vital Signs:  BP (!) 107/55   Pulse (!) 101   Temp (!) 92.9 F (33.8 C)   Resp 13   Ht 5\' 2"  (1.575 m)   SpO2 (!) 59%   BMI 22.31 kg/m   Palliative Assessment/Data: 10%    Advanced Care Planning:   Existing Vynca/ACP Documentation: None  Primary Decision Maker: The patient's husband Jaime Barry  Barry is the Management consultant.  Code Status/Advance Care Planning: DNR: comfort measures   Assessment & Plan:   SUMMARY OF RECOMMENDATIONS   DNR: comfort measures Continued PMT support Reassess condition tomorrow to determine if inpatient hospice is appropriate  Symptom Management:  Per MAR- entered by attending team  Prognosis:  < 2 weeks  Discharge Planning:  To Be Determined   Discussed with: Dr. Janee Morn   Thank you for allowing [place] to participate in the care of Jaime Barry PMT will continue to support  holistically.  Time Total: 55 minutes   Signed by: Sarina Ser, NP Palliative Medicine Team  Team Phone # [phone] (Nights/Weekends)  04/22/2023, 9:29 AM

## 2023-04-22 NOTE — Assessment & Plan Note (Signed)
At baseline pt has advancing alzheimers dementia.

## 2023-04-22 NOTE — ED Notes (Signed)
ED TO INPATIENT HANDOFF REPORT  ED Nurse Name and Phone #: Pennie Rushing A. Ella Golomb, RN   S Name/Age/Gender Jaime Barry 82 y.o. female Room/Bed: TRABC/TRABC  Code Status   Code Status: DNR  Home/SNF/Other Anticipated to D/c to morgue    Triage Complete: Triage complete  Chief Complaint Admission for end of life care [Z51.5]  Triage Note Pt was eating dinner and husband left for 10 mins and pt was found down with food in her moth and unresponsive, medic arived with a potential of of total down time before pulses were gained. Medic gave x2 epi, with a 150bpm   Allergies No Known Allergies  Level of Care/Admitting Diagnosis ED Disposition     ED Disposition  Admit   Condition  --   Comment  Hospital [facility] [[id number]]  Level of Care: Med-Surg [16]  May admit patient to [facility] or [facility] if equivalent level of care is available:: No  Covid Evaluation: Asymptomatic - no recent exposure (last 10 days) testing not required  Diagnosis: Admission for end of life care [332951]  Admitting Physician: Hillary Bow [[id number]]  Attending Physician: Hillary Bow [phone]  Certification:: I certify there are rare and unusual circumstances requiring inpatient admission  Estimated Length of Stay: 1          B Medical/Surgery History Past Medical History:  Diagnosis Date   Altered mental state 08/29/2012   Dementia (HCC)    Dementia (HCC)    progressive    H/O cerebral aneurysm repair    in December of 2011   Lower leg edema    Osteoporosis    Seizure (HCC)    Seizures (HCC) 08/29/2012   Spinal stenosis    Past Surgical History:  Procedure Laterality Date   artery aneurysm clipping Right    CEREBRAL ANEURYSM REPAIR  09/2010   paraophthalmic artery /note 08/29/2012   LUMBAR PUNCTURE  08/29/2012   TONSILLECTOMY       A IV Location/Drains/Wounds Patient Lines/Drains/Airways Status     Active Line/Drains/Airways     Name  Placement date Placement time Site Days   Peripheral IV 05-16-23 18 G Right Antecubital May 16, 2023  1939  Antecubital  1   Peripheral IV 16-May-2023 18 G Left Antecubital May 16, 2023  1949  Antecubital  1   Urethral Catheter Kash Davie A. Herndon Grill, RN Temperature probe 16 Fr. 2023-05-16  2024  Temperature probe  1            Intake/Output Last 24 hours No intake or output data in the 24 hours ending 04/22/23 0028  Labs/Imaging Results for orders placed or performed during the hospital encounter of 05/16/2023 (from the past 48 hour(s))  Troponin I (High Sensitivity)     Status: Abnormal   Collection Time: 05-16-23  7:38 PM  Result Value Ref Range   Troponin I (High Sensitivity) 104 (HH) <18 ng/L    Comment: CRITICAL RESULT CALLED TO, READ BACK BY AND VERIFIED WITH S. Xzavien Harada RN May 16, 2023 @2101  BY J. WHITE (NOTE) Elevated high sensitivity troponin I (hsTnI) values and significant  changes across serial measurements may suggest ACS but many other  chronic and acute conditions are known to elevate hsTnI results.  Refer to the "Links" section for chest pain algorithms and additional  guidance. Performed at [facility], [address], [place], [place] [address]   CBC with Differential     Status: Abnormal   Collection Time: 16-May-2023  7:39 PM  Result Value Ref Range   WBC 15.6 (H) 4.0 - 10.5 K/uL   RBC 3.73 (L) 3.87 - 5.11 MIL/uL   Hemoglobin 12.4 12.0 - 15.0 g/dL   HCT 11.9 14.7 - 82.9 %   MCV 114.2 (H) 80.0 - 100.0 fL   MCH 33.2 26.0 - 34.0 pg   MCHC 29.1 (L) 30.0 - 36.0 g/dL   RDW 56.2 13.0 - 86.5 %   Platelets 113 (L) 150 - 400 K/uL    Comment: SPECIMEN CHECKED FOR CLOTS REPEATED TO VERIFY PLATELET COUNT CONFIRMED BY SMEAR    nRBC 0.2 0.0 - 0.2 %   Neutrophils Relative % 49 %   Neutro Abs 7.6 1.7 - 7.7 K/uL   Lymphocytes Relative 49 %   Lymphs Abs 7.6 (H) 0.7 - 4.0 K/uL   Monocytes Relative 2 %   Monocytes Absolute 0.3 0.1 - 1.0 K/uL   Eosinophils Relative 0 %   Eosinophils Absolute  0.0 0.0 - 0.5 K/uL   Basophils Relative 0 %   Basophils Absolute 0.0 0.0 - 0.1 K/uL   WBC Morphology See Note     Comment: Moderate Left Shift. >5% Metas and Myelos   nRBC 0 0 /100 WBC   Abs Immature Granulocytes 0.00 0.00 - 0.07 K/uL   Burr Cells PRESENT     Comment: Performed at [facility], [address], [place], [place] [address]  Comprehensive metabolic panel     Status: Abnormal   Collection Time: 2023-05-18  7:39 PM  Result Value Ref Range   Sodium 138 135 - 145 mmol/L   Potassium 4.2 3.5 - 5.1 mmol/L   Chloride 105 98 - 111 mmol/L   CO2 16 (L) 22 - 32 mmol/L   Glucose, Bld 261 (H) 70 - 99 mg/dL    Comment: Glucose reference range applies only to samples taken after fasting for at least 8 hours.   BUN 17 8 - 23 mg/dL   Creatinine, Ser 6.29 (H) 0.44 - 1.00 mg/dL   Calcium 7.9 (L) 8.9 - 10.3 mg/dL   Total Protein 5.2 (L) 6.5 - 8.1 g/dL   Albumin 2.6 (L) 3.5 - 5.0 g/dL   AST 528 (H) 15 - 41 U/L   ALT 298 (H) 0 - 44 U/L   Alkaline Phosphatase 53 38 - 126 U/L   Total Bilirubin 0.6 0.3 - 1.2 mg/dL   GFR, Estimated 47 (L) >60 mL/min    Comment: (NOTE) Calculated using the CKD-EPI Creatinine Equation (2021)    Anion gap 17 (H) 5 - 15    Comment: Performed at [facility], [address], [place], [place] [address]  POC CBG, ED     Status: Abnormal   Collection Time: 05-18-23  7:44 PM  Result Value Ref Range   Glucose-Capillary 252 (H) 70 - 99 mg/dL    Comment: Glucose reference range applies only to samples taken after fasting for at least 8 hours.  I-stat chem 8, ED (not at [facility], [facility] or [facility])     Status: Abnormal   Collection Time: 2023/05/18  7:58 PM  Result Value Ref Range   Sodium 140 135 - 145 mmol/L   Potassium 4.1 3.5 - 5.1 mmol/L   Chloride 106 98 - 111 mmol/L   BUN 20 8 - 23 mg/dL   Creatinine, Ser 4.01 0.44 - 1.00 mg/dL   Glucose, Bld 027 (H) 70 - 99 mg/dL    Comment: Glucose reference range applies only to samples taken  after fasting for at least 8  hours.   Calcium, Ion 1.07 (L) 1.15 - 1.40 mmol/L   TCO2 19 (L) 22 - 32 mmol/L   Hemoglobin 12.9 12.0 - 15.0 g/dL   HCT 16.1 09.6 - 04.5 %  I-Stat venous blood gas, ([facility] ED, [facility], [facility])     Status: Abnormal   Collection Time: 04/10/2023  7:58 PM  Result Value Ref Range   pH, Ven 6.927 (LL) 7.25 - 7.43   pCO2, Ven 81.1 (HH) 44 - 60 mmHg   pO2, Ven 90 (H) 32 - 45 mmHg   Bicarbonate 16.9 (L) 20.0 - 28.0 mmol/L   TCO2 19 (L) 22 - 32 mmol/L   O2 Saturation 88 %   Acid-base deficit 17.0 (H) 0.0 - 2.0 mmol/L   Sodium 139 135 - 145 mmol/L   Potassium 4.1 3.5 - 5.1 mmol/L   Calcium, Ion 1.07 (L) 1.15 - 1.40 mmol/L   HCT 39.0 36.0 - 46.0 %   Hemoglobin 13.3 12.0 - 15.0 g/dL   Sample type VENOUS    Comment NOTIFIED PHYSICIAN   Urinalysis, Routine w reflex microscopic -Urine, Catheterized     Status: Abnormal   Collection Time: 04/12/2023  8:25 PM  Result Value Ref Range   Color, Urine YELLOW YELLOW   APPearance HAZY (A) CLEAR   Specific Gravity, Urine 1.024 1.005 - 1.030   pH 6.0 5.0 - 8.0   Glucose, UA 150 (A) NEGATIVE mg/dL   Hgb urine dipstick MODERATE (A) NEGATIVE   Bilirubin Urine NEGATIVE NEGATIVE   Ketones, ur NEGATIVE NEGATIVE mg/dL   Protein, ur 409 (A) NEGATIVE mg/dL   Nitrite NEGATIVE NEGATIVE   Leukocytes,Ua NEGATIVE NEGATIVE   RBC / HPF 21-50 0 - 5 RBC/hpf   WBC, UA 6-10 0 - 5 WBC/hpf   Bacteria, UA RARE (A) NONE SEEN   Squamous Epithelial / HPF 0-5 0 - 5 /HPF   Mucus PRESENT    Non Squamous Epithelial 0-5 (A) NONE SEEN    Comment: Performed at [facility], [address] [place], [place] [address]   CT Angio Chest PE W and/or Wo Contrast  Result Date: 04/27/2023 CLINICAL DATA:  Concern for pulmonary embolism. EXAM: CT ANGIOGRAPHY CHEST WITH CONTRAST TECHNIQUE: Multidetector CT imaging of the chest was performed using the standard protocol during bolus administration of intravenous contrast. Multiplanar CT image reconstructions and MIPs were obtained to evaluate the  vascular anatomy. RADIATION DOSE REDUCTION: This exam was performed according to the departmental dose-optimization program which includes automated exposure control, adjustment of the mA and/or kV according to patient size and/or use of iterative reconstruction technique. CONTRAST:  75mL OMNIPAQUE IOHEXOL 350 MG/ML SOLN COMPARISON:  None Available. FINDINGS: Evaluation is limited due to streak artifact caused by patient's arms and support apparatus. Cardiovascular: There is no cardiomegaly or pericardial effusion. There is coronary vascular calcification and calcification of the mitral annulus. There is mild atherosclerotic calcification of the thoracic aorta. No aneurysmal dilatation or dissection. The origins of the great vessels of the aortic arch appear patent. No pulmonary artery embolus identified. Mediastinum/Nodes: No hilar or mediastinal adenopathy. Ingested debris noted in the upper esophagus. No mediastinal fluid collection. Lungs/Pleura: There is diffuse ground-glass airspace density throughout the right upper lobe and to a lesser degree right lower lobe with areas of consolidative changes along the subpleural right upper lobe. Findings may represent pulmonary contusion/hemorrhage in the setting of trauma. Pneumonia or aspiration are not excluded. Clinical correlation is recommended. The left  lung is clear. No pleural effusion or pneumothorax. Endotracheal tube with tip approximately 2 cm above the carina. There is impacted mucus in the right mainstem bronchus extending into the right upper lobe bronchus as well as mucous debris in the bronchus intermedius. There is mucous plugging in the distal right lower lobe bronchi. The central airways otherwise remain patent. Upper Abdomen: Suboptimally evaluated due to streak artifact. Musculoskeletal: Osteopenia with degenerative changes of the spine. There is a mildly displaced fracture of the anterior right fourth rib. Mildly displaced fractures of the  anterior left 4th-7th ribs. Review of the MIP images confirms the above findings. IMPRESSION: 1. No CT evidence of pulmonary embolism. 2. Bilateral rib fractures.  No pneumothorax. 3. Probable pulmonary contusion predominantly involving the right upper lobe. Pneumonia or aspiration are not excluded. 4. Impacted mucus in the right mainstem bronchus, right upper lobe bronchus, bronchus intermedius, and more distal right lower lobe bronchi. 5. Retained ingested content in the upper esophagus. 6.  Aortic Atherosclerosis (ICD10-I70.0). Electronically Signed   By: Elgie Collard M.D.   On: 2023/05/02 20:35   DG Chest Portable 1 View  Result Date: 05/02/2023 CLINICAL DATA:  Status post intubation EXAM: PORTABLE CHEST 1 VIEW COMPARISON:  08/29/2012 FINDINGS: Cardiac shadow is within normal limits. Endotracheal tube is noted 2.8 cm above the carina. Left lung is well aerated and clear. Right lung shows decreased volume with diffuse airspace opacity consistent with multifocal infiltrate. No sizable effusion is seen. No bony abnormality is noted. IMPRESSION: Right-sided multifocal infiltrate. Electronically Signed   By: Alcide Clever M.D.   On: 05/02/2023 20:35   CT Head Wo Contrast  Result Date: 05-02-2023 CLINICAL DATA:  Mental status change, unknown cause. EXAM: CT HEAD WITHOUT CONTRAST TECHNIQUE: Contiguous axial images were obtained from the base of the skull through the vertex without intravenous contrast. RADIATION DOSE REDUCTION: This exam was performed according to the departmental dose-optimization program which includes automated exposure control, adjustment of the mA and/or kV according to patient size and/or use of iterative reconstruction technique. COMPARISON:  04/01/2023. FINDINGS: Brain: No acute intracranial hemorrhage, midline shift or mass effect. No extra-axial fluid collection. Mild diffuse atrophy is noted. Periventricular white matter hypodensities are present bilaterally. No hydrocephalus.  Vascular: No hyperdense vessel or unexpected calcification. Prior right ICA aneurysm clipping. Skull: No acute fracture. Craniotomy changes are noted in the frontotemporal region on the right. Sinuses/Orbits: No acute finding. Other: An endotracheal tube is noted in the oral cavity. IMPRESSION: 1. No acute intracranial process. 2. Atrophy with chronic microvascular ischemic changes. 3. Craniotomy changes in the frontotemporal region on the right. Electronically Signed   By: Thornell Sartorius M.D.   On: 05-02-2023 20:22    Pending Labs Unresulted Labs (From admission, onward)     Start     Ordered   05-02-23 1943  SARS Coronavirus 2 by RT PCR (hospital order, performed in [facility] hospital lab) *cepheid single result test* Anterior Nasal Swab  (Tier 2 - SARS Coronavirus 2 by RT PCR (hospital order, performed in [facility] hospital lab) *cepheid single result test*)  Once,   URGENT        May 02, 2023 1942            Vitals/Pain Today's Vitals   May 02, 2023 2330 05-02-23 2345 04/22/23 0000 04/22/23 0015  BP:      Pulse: (!) 102 (!) 101 (!) 101 (!) 101  Resp: 12 14 13 13   Temp: (!) 92.7 F (33.7 C) (!) 92.7 F (33.7  C) (!) 92.8 F (33.8 C) (!) 92.9 F (33.8 C)  SpO2: (!) 52% (!) 55% (!) 57% (!) 59%  Height:        Isolation Precautions No active isolations  Medications Medications  0.9 %  sodium chloride infusion (250 mLs Intravenous New Bag/Given May 05, 2023 2032)  0.9 %  sodium chloride infusion (0 mLs Intravenous Hold 05-05-2023 2131)  acetaminophen (TYLENOL) tablet 650 mg (has no administration in time range)    Or  acetaminophen (TYLENOL) suppository 650 mg (has no administration in time range)  glycopyrrolate (ROBINUL) tablet 1 mg (has no administration in time range)    Or  glycopyrrolate (ROBINUL) injection 0.2 mg (has no administration in time range)    Or  glycopyrrolate (ROBINUL) injection 0.2 mg (has no administration in time range)  polyvinyl alcohol (LIQUIFILM TEARS) 1.4 %  ophthalmic solution 1 drop (has no administration in time range)  fentaNYL (SUBLIMAZE) bolus via infusion 100 mcg (100 mcg Intravenous Bolus from Bag 2023-05-05 2300)  fentaNYL in NS (8mcg/ml) infusion-PREMIX (50 mcg/hr Intravenous Restarted May 05, 2023 2130)  midazolam (VERSED) injection 2 mg (2 mg Intravenous Given 2023-05-05 2301)  sodium chloride 0.9 % bolus 1,000 mL (0 mLs Intravenous Stopped 05-05-2023 2043)  cefTRIAXone (ROCEPHIN) 2 g in sodium chloride 0.9 % 100 mL IVPB (0 g Intravenous Stopped May 05, 2023 2043)  fentaNYL (SUBLIMAZE) injection 100 mcg (100 mcg Intravenous Given 05/05/23 2013)  iohexol (OMNIPAQUE) 350 MG/ML injection 75 mL (75 mLs Intravenous Contrast Given 05/05/23 2014)    Mobility Palliative      Focused Assessments Pt is palliative    R Recommendations: See Admitting Provider Note  Report given to:   Additional Notes: Pt is palliative care

## 2023-04-25 ENCOUNTER — Telehealth: Payer: Self-pay | Admitting: Adult Health

## 2023-04-25 NOTE — Telephone Encounter (Signed)
Pt husband called and stated pt passed away on 2023-05-06.

## 2023-04-25 NOTE — Telephone Encounter (Signed)
We are so sorry to hear that. We will send a condolence card when [facility] NP is back in office.

## 2023-05-02 NOTE — Telephone Encounter (Signed)
Card mailed today.

## 2023-05-03 NOTE — Death Summary Note (Signed)
DEATH SUMMARY   Patient Details  Name: Jaime Barry MRN: [id number] DOB: 05-22-1941 WGN:[id number], Toniann Fail, MD Admission/Discharge Information   Admit Date:  05/15/2023  Date of Death: Date of Death: 05/17/23  Time of Death: Time of Death: 0900  Length of Stay: 1   Principle Cause of death: Prolonged cardiac arrest secondary to aspiration from suspected seizure in female patient with dementia.    Hospital Diagnoses: Principal Problem:   Admission for end of life care Active Problems:   Cardiac arrest [facility])   Generalized convulsive epilepsy (HCC)   Alzheimer's disease [facility])   Hospital Course:  Jaime Barry is a 82 y.o. female with medical history significant of dementia, seizures.   Pt in to ED following unwitnessed cardiac arrest: She reportedly was found unresponsive by her husband while eating her dinner. He had been away from her for ~10 minutes when he found her on the ground with food in her mouth, unresponsive. When EMS arrived, she was in PEA arrest and required ~40 minutes of CPR/ACLS before ROSC was obtained.   Received 40 min of CPR prior to ROSC.   Recent admission for seizure in setting of subtherapeutic Depakote level.   History of progressive dementia, but had been able to stay alone during day.   Intubated in ED.  Started on fentanyl for rib fractures.   Ultimately decision made by husband in discussion with Intensivist to de-escalate care and proceed to comfort measures, as per Dr. Birdena Jubilee note: "Prolonged cardiac arrest due to aspiration from suspected seizure in frail patient with dementia.  No realistic expectation of meaningful neurological recovery. I  believe additional observation period would only delay coming to same conclusion.  Dr Julian Reil and Dr Drucie Ip reviewed the patient's advanced directive and the patient does indeed have and uncurable condition for which transition to comfort care is the most appropriate course of action.  The patient's  husband was ready to withdraw care at time of admission and there is no additional family to visit at present.  Patient placed on fentanyl for comfort.   Pt extubated, hospitalist asked to admit for end of life care.  Patient admitted, end-of-life order set placed, palliative care consulted, patient kept comfortable. Patient subsequently died on 05-17-2023 at 0900 hrs.  Assessment and Plan: * Admission for end of life care Pt in to ED following unwitnessed cardiac arrest (? Due to seizure).  40 min CPR until ROSC. After intensivist discussion with husband: pt extubated and decision made by husband to proceed to comfort measures only. Pt husband at bedside per admitting physician, he confirmed goals of care are to keep patient comfortable as we wait for her to pass on. Patient admitted for end of life care/comfort measures.   End-of-life order set placed and patient kept comfortable.   Patient placed on fentanyl drip with bolus as needed.   Versed as needed was placed.   Palliative care consulted who assessed and followed the patient.   Patient was kept comfortable and patient subsequently died on May 17, 2023 at 0900 hrs.  May her soul rest in peace.  Generalized convulsive epilepsy (HCC) H/o seizure disorder with seizure admit at end of last month. PRN versed ordered and available for seizures if needed. Patient was transition to full comfort measures.  Alzheimer's disease (HCC) At baseline pt has advancing alzheimers dementia.         Procedures: CT head 05/15/2023 CT angiogram chest 2023/05/15  Consultations: PCCM: Dr. Denese Killings 15-May-2023 Palliative care: Dr. Patterson Hammersmith 04/22/2023  The results of significant diagnostics from this hospitalization (including imaging, microbiology, ancillary and laboratory) are listed below for reference.   Significant Diagnostic Studies: CT Angio Chest PE W and/or Wo Contrast  Result Date: 04/16/2023 CLINICAL DATA:  Concern for pulmonary embolism.  EXAM: CT ANGIOGRAPHY CHEST WITH CONTRAST TECHNIQUE: Multidetector CT imaging of the chest was performed using the standard protocol during bolus administration of intravenous contrast. Multiplanar CT image reconstructions and MIPs were obtained to evaluate the vascular anatomy. RADIATION DOSE REDUCTION: This exam was performed according to the departmental dose-optimization program which includes automated exposure control, adjustment of the mA and/or kV according to patient size and/or use of iterative reconstruction technique. CONTRAST:  75mL OMNIPAQUE IOHEXOL 350 MG/ML SOLN COMPARISON:  None Available. FINDINGS: Evaluation is limited due to streak artifact caused by patient's arms and support apparatus. Cardiovascular: There is no cardiomegaly or pericardial effusion. There is coronary vascular calcification and calcification of the mitral annulus. There is mild atherosclerotic calcification of the thoracic aorta. No aneurysmal dilatation or dissection. The origins of the great vessels of the aortic arch appear patent. No pulmonary artery embolus identified. Mediastinum/Nodes: No hilar or mediastinal adenopathy. Ingested debris noted in the upper esophagus. No mediastinal fluid collection. Lungs/Pleura: There is diffuse ground-glass airspace density throughout the right upper lobe and to a lesser degree right lower lobe with areas of consolidative changes along the subpleural right upper lobe. Findings may represent pulmonary contusion/hemorrhage in the setting of trauma. Pneumonia or aspiration are not excluded. Clinical correlation is recommended. The left lung is clear. No pleural effusion or pneumothorax. Endotracheal tube with tip approximately 2 cm above the carina. There is impacted mucus in the right mainstem bronchus extending into the right upper lobe bronchus as well as mucous debris in the bronchus intermedius. There is mucous plugging in the distal right lower lobe bronchi. The central airways  otherwise remain patent. Upper Abdomen: Suboptimally evaluated due to streak artifact. Musculoskeletal: Osteopenia with degenerative changes of the spine. There is a mildly displaced fracture of the anterior right fourth rib. Mildly displaced fractures of the anterior left 4th-7th ribs. Review of the MIP images confirms the above findings. IMPRESSION: 1. No CT evidence of pulmonary embolism. 2. Bilateral rib fractures.  No pneumothorax. 3. Probable pulmonary contusion predominantly involving the right upper lobe. Pneumonia or aspiration are not excluded. 4. Impacted mucus in the right mainstem bronchus, right upper lobe bronchus, bronchus intermedius, and more distal right lower lobe bronchi. 5. Retained ingested content in the upper esophagus. 6.  Aortic Atherosclerosis (ICD10-I70.0). Electronically Signed   By: Elgie Collard M.D.   On: 04/19/2023 20:35   DG Chest Portable 1 View  Result Date: 04/20/2023 CLINICAL DATA:  Status post intubation EXAM: PORTABLE CHEST 1 VIEW COMPARISON:  08/29/2012 FINDINGS: Cardiac shadow is within normal limits. Endotracheal tube is noted 2.8 cm above the carina. Left lung is well aerated and clear. Right lung shows decreased volume with diffuse airspace opacity consistent with multifocal infiltrate. No sizable effusion is seen. No bony abnormality is noted. IMPRESSION: Right-sided multifocal infiltrate. Electronically Signed   By: Alcide Clever M.D.   On: 04/06/2023 20:35   CT Head Wo Contrast  Result Date: 04/20/2023 CLINICAL DATA:  Mental status change, unknown cause. EXAM: CT HEAD WITHOUT CONTRAST TECHNIQUE: Contiguous axial images were obtained from the base of the skull through the vertex without intravenous contrast. RADIATION DOSE REDUCTION: This exam was performed according to the departmental dose-optimization program which includes automated exposure control, adjustment of the  mA and/or kV according to patient size and/or use of iterative reconstruction technique.  COMPARISON:  04/01/2023. FINDINGS: Brain: No acute intracranial hemorrhage, midline shift or mass effect. No extra-axial fluid collection. Mild diffuse atrophy is noted. Periventricular white matter hypodensities are present bilaterally. No hydrocephalus. Vascular: No hyperdense vessel or unexpected calcification. Prior right ICA aneurysm clipping. Skull: No acute fracture. Craniotomy changes are noted in the frontotemporal region on the right. Sinuses/Orbits: No acute finding. Other: An endotracheal tube is noted in the oral cavity. IMPRESSION: 1. No acute intracranial process. 2. Atrophy with chronic microvascular ischemic changes. 3. Craniotomy changes in the frontotemporal region on the right. Electronically Signed   By: Thornell Sartorius M.D.   On: 04-22-23 20:22   MR BRAIN WO CONTRAST  Result Date: 04/01/2023 CLINICAL DATA:  Stroke suspected EXAM: MRI HEAD WITHOUT CONTRAST TECHNIQUE: Multiplanar, multiecho pulse sequences of the brain and surrounding structures were obtained without intravenous contrast. COMPARISON:  08/30/2012 MRI head, correlation is also made with CT head 04/01/2023 FINDINGS: Evaluation is mildly limited by susceptibility artifact from the patient's right ICA aneurysm clip. Brain: No restricted diffusion to suggest acute or subacute infarct. No acute hemorrhage, mass, mass effect, or midline shift. No hydrocephalus or extra-axial collection. Normal pituitary and craniocervical junction. No hemosiderin deposition to suggest remote hemorrhage. Age related cerebral atrophy. Confluent T2 hyperintense signal in the periventricular white matter, likely the sequela of moderate chronic small vessel ischemic disease. Vascular: Normal arterial flow voids. Susceptibility from right ICA aneurysm clips. Skull and upper cervical spine: Normal marrow signal. Sequela of prior right frontotemporal craniotomy. Sinuses/Orbits: Mucosal thickening ethmoid air cells. No acute finding in the orbits. Other: The  mastoid air cells are well aerated. IMPRESSION: No acute intracranial process. No evidence of acute or subacute infarct. Electronically Signed   By: Wiliam Ke M.D.   On: 04/01/2023 19:22   CT C-SPINE NO CHARGE  Result Date: 04/01/2023 CLINICAL DATA:  Seizures.  Found down. EXAM: CT CERVICAL SPINE WITH CONTRAST TECHNIQUE: Multiplanar CT images of the cervical spine were reconstructed from contemporary CTA of the Neck. RADIATION DOSE REDUCTION: This exam was performed according to the departmental dose-optimization program which includes automated exposure control, adjustment of the mA and/or kV according to patient size and/or use of iterative reconstruction technique. CONTRAST:  No additional. COMPARISON:  None Available. FINDINGS: Alignment: Straightening of the normal cervical lordosis. Trace anterolisthesis of C3 on C4 and trace retrolisthesis of C5 on C6, likely degenerative. Skull base and vertebrae: No acute cervical spine fracture or suspicious osseous lesion. Mild compression deformity of the T3 superior endplate, favored to be chronic. Soft tissues and spinal canal: No prevertebral fluid or swelling. No visible canal hematoma. Disc levels: Moderate to severe disc degeneration from C3-4 through C6-7. Multilevel facet arthrosis, most severe on the left at C3-4 and C4-5. Mild-to-moderate spinal stenosis at C5-6 and C6-7. Moderate neural foraminal stenosis bilaterally at C5-6 and C6-7 and on the left at C3-4. Upper chest: No mass or consolidation in the lung apices. Other: None. IMPRESSION: 1. No acute cervical spine fracture. 2. Advanced cervical disc and facet degeneration. Electronically Signed   By: Sebastian Ache M.D.   On: 04/01/2023 17:15   CT ANGIO HEAD NECK W WO CM W PERF (CODE STROKE)  Result Date: 04/01/2023 CLINICAL DATA:  Neuro deficit, acute, stroke suspected. Left-sided weakness. EXAM: CT ANGIOGRAPHY HEAD AND NECK CT PERFUSION BRAIN TECHNIQUE: Multidetector CT imaging of the head and  neck was performed using the standard protocol during  bolus administration of intravenous contrast. Multiplanar CT image reconstructions and MIPs were obtained to evaluate the vascular anatomy. Carotid stenosis measurements (when applicable) are obtained utilizing NASCET criteria, using the distal internal carotid diameter as the denominator. Multiphase CT imaging of the brain was performed following IV bolus contrast injection. Subsequent parametric perfusion maps were calculated using RAPID software. RADIATION DOSE REDUCTION: This exam was performed according to the departmental dose-optimization program which includes automated exposure control, adjustment of the mA and/or kV according to patient size and/or use of iterative reconstruction technique. CONTRAST:  OMNIPAQUE IOHEXOL 350 MG/ML SOLN COMPARISON:  CTA head 08/17/2010 FINDINGS: CTA NECK FINDINGS Aortic arch: Standard 3 vessel aortic arch with mild atherosclerotic plaque. No stenosis of the arch vessel origins. Right carotid system: Patent with predominantly calcified plaque at the carotid bifurcation resulting in severe stenosis of the ECA origin but no significant ICA or common carotid artery stenosis. Tortuous distal cervical ICA. Left carotid system: Patent with a small amount of calcified plaque at the carotid bifurcation. No significant stenosis. Vertebral arteries: Patent without evidence of stenosis or dissection. Mildly dominant right vertebral artery. Skeleton: Advanced cervical disc and facet degeneration. Mild compression deformity of the T3 superior endplate, chronic in appearance. Other neck: No evidence of cervical lymphadenopathy or mass. Upper chest: No mass or consolidation in the included lung apices. Review of the MIP images confirms the above findings CTA HEAD FINDINGS Anterior circulation: The internal carotid arteries are widely patent from skull base to carotid termini with prominent tortuosity of the cavernous segments. There  has been interval clipping of a right supraclinoid ICA aneurysm with some peripheral calcification of the treated aneurysm but no definite aneurysm opacification. ACAs and MCAs are patent without evidence of a proximal branch occlusion or significant proximal stenosis. Posterior circulation: The intracranial vertebral arteries are widely patent to the basilar. The basilar artery is widely patent. There is a large right posterior communicating artery with hypoplasia of the right P1 segment. Both PCAs are patent without evidence of a significant proximal stenosis. No aneurysm is identified. Venous sinuses: As permitted by contrast timing, patent. Anatomic variants: Fetal right PCA.  Hypoplastic right A1 segment. Review of the MIP images confirms the above findings CT Brain Perfusion Findings: ASPECTS: 10 CBF (<30%) Volume: 0 mL Perfusion (Tmax>6.0s) volume: 0 mL Mismatch Volume: 0 mL Infarction Location: None identified by CTP. These results were communicated to Dr. Amada Jupiter at 4:37 pm on 04/01/2023 by text page via the [facility] messaging system. IMPRESSION: 1. No large vessel occlusion. 2. No evidence of acute infarct or ischemic penumbra on CTP. 3. Prior clipping of a right supraclinoid ICA aneurysm. 4. Cervical carotid atherosclerosis with severe stenosis of the right ECA origin but no significant common or internal carotid artery stenosis. 5.  Aortic Atherosclerosis (ICD10-I70.0). Electronically Signed   By: Sebastian Ache M.D.   On: 04/01/2023 16:48   EEG adult  Result Date: 04/01/2023 Charlsie Quest, MD     04/01/2023  5:36 PM Patient Name: ANNALIS KACZMARCZYK MRN: [id number] Epilepsy Attending: Charlsie Quest Referring Physician/Provider: Mathews Argyle, NP Date: 04/01/2023 Duration: 22.40 mins Patient history: Patient BIB [facility] from home for focal seizures.EEG to evaluate for seizure Level of alertness: Awake AEDs during EEG study: VPA Technical aspects: This EEG study was done with scalp electrodes positioned  according to the 10-20 International system of electrode placement. Electrical activity was reviewed with band pass filter of 1-70Hz , sensitivity of 7 uV/mm, display speed of 1mm/sec with a  60Hz  notched filter applied as appropriate. EEG data were recorded continuously and digitally stored.  Video monitoring was available and reviewed as appropriate. Description: The posterior dominant rhythm consists of 8.5 Hz activity of moderate voltage (25-35 uV) seen predominantly in posterior head regions, symmetric and reactive to eye opening and eye closing. EEG showed continuous 3 to 6 Hz theta-delta slowing with overriding 13-15hz  beta activity in right fronto-temporal region consistent with breach artifact.   Hyperventilation and photic stimulation were not performed.   ABNORMALITY - Breach artifact, right fronto-temporal region IMPRESSION: This study is suggestive of cortical dysfunction arising from right fronto-temporal region consistent with underlying craniotomy. No seizures or epileptiform discharges were seen throughout the recording. Charlsie Quest   CT HEAD CODE STROKE WO CONTRAST  Result Date: 04/01/2023 CLINICAL DATA:  Code stroke. Neuro deficit, acute, stroke suspected. Left-sided weakness. EXAM: CT HEAD WITHOUT CONTRAST TECHNIQUE: Contiguous axial images were obtained from the base of the skull through the vertex without intravenous contrast. RADIATION DOSE REDUCTION: This exam was performed according to the departmental dose-optimization program which includes automated exposure control, adjustment of the mA and/or kV according to patient size and/or use of iterative reconstruction technique. COMPARISON:  Head CT 08/29/2012 and MRI 08/30/2012 FINDINGS: Brain: There is no evidence of an acute infarct, intracranial hemorrhage, mass, midline shift, or extra-axial fluid collection. Patchy hypodensities in the cerebral white matter bilaterally have progressed and are nonspecific but compatible with  moderate chronic small vessel ischemic disease. There is progressive, mild-to-moderate cerebral atrophy. Vascular: Prior right ICA aneurysm clipping. Skull: Right frontotemporal craniotomy. Sinuses/Orbits: Minor mucosal thickening in the paranasal sinuses. Clear mastoid air cells. Unremarkable orbits. Other: None. ASPECTS (Alberta Stroke Program Early CT Score) - Ganglionic level infarction (caudate, lentiform nuclei, internal capsule, insula, M1-M3 cortex): 7 - Supraganglionic infarction (M4-M6 cortex): 3 Total score (0-10 with 10 being normal): 10 These results were communicated to Dr. Amada Jupiter at 4:07 pm on 04/01/2023 by text page via the [facility] messaging system. IMPRESSION: 1. No evidence of acute intracranial abnormality. ASPECTS of 10. 2. Moderate chronic small vessel ischemic disease. Electronically Signed   By: Sebastian Ache M.D.   On: 04/01/2023 16:07    Microbiology: No results found for this or any previous visit (from the past 240 hour(s)).  Time spent: 50 minutes  Signed: Ramiro Harvest, MD 04/28/23

## 2023-05-03 NOTE — Hospital Course (Signed)
Jaime Barry is a 82 y.o. female with medical history significant of dementia, seizures.   Pt in to ED following unwitnessed cardiac arrest: She reportedly was found unresponsive by her husband while eating her dinner. He had been away from her for ~10 minutes when he found her on the ground with food in her mouth, unresponsive. When EMS arrived, she was in PEA arrest and required ~40 minutes of CPR/ACLS before ROSC was obtained.   Received 40 min of CPR prior to ROSC.   Recent admission for seizure in setting of subtherapeutic Depakote level.   History of progressive dementia, but had been able to stay alone during day.   Intubated in ED.  Started on fentanyl for rib fractures.   Ultimately decision made by husband in discussion with Intensivist to de-escalate care and proceed to comfort measures, as per Dr. Birdena Jubilee note: "Prolonged cardiac arrest due to aspiration from suspected seizure in frail patient with dementia.  No realistic expectation of meaningful neurological recovery. I  believe additional observation period would only delay coming to same conclusion.  Dr Julian Reil and Dr Drucie Ip reviewed the patient's advanced directive and the patient does indeed have and uncurable condition for which transition to comfort care is the most appropriate course of action.  The patient's husband was ready to withdraw care at time of admission and there is no additional family to visit at present.  Patient placed on fentanyl for comfort.   Pt extubated, hospitalist asked to admit for end of life care.  Patient admitted, end-of-life order set placed, palliative care consulted, patient kept comfortable. Patient subsequently died on Apr 28, 2023 at 0900 hrs.

## 2023-05-03 NOTE — Progress Notes (Addendum)
This chaplain responded to consult for EOL spiritual care. Upon entering the unit, the chaplain learned of the Pt. death and sat beside the RN as she phoned the Pt. husband. The chaplain understands the Pt. husband is requesting a chaplain visit when he arrives.  [id number] The chaplain joined the Pt. husband-Barry at the bedside. Gery Pray accepted the chaplain's invitation for storytelling. The chaplain understands the Pt. leaves her legacy with many students who continued their adult education.   Gery Pray shares the Pt. did not have any personal belongings in the room. Gery Pray will wait for a call from Patient Placement for communicating his choice of a funeral home. Gery Pray accepted the chaplain's intercessory prayer.  Chaplain Stephanie Acre [phone]

## 2023-05-03 DEATH — deceased

## 2023-08-16 ENCOUNTER — Ambulatory Visit: Payer: Medicare PPO | Admitting: Adult Health
